# Patient Record
Sex: Female | Born: 1995 | Race: White | Hispanic: No | Marital: Married | Smoking: Former smoker
Health system: Southern US, Community
[De-identification: ages and names within clinical notes are randomized; demographics above are authoritative.]

## PROBLEM LIST (undated history)

## (undated) ENCOUNTER — Inpatient Hospital Stay: Payer: Self-pay

## (undated) DIAGNOSIS — R011 Cardiac murmur, unspecified: Secondary | ICD-10-CM

## (undated) DIAGNOSIS — Z8041 Family history of malignant neoplasm of ovary: Secondary | ICD-10-CM

## (undated) DIAGNOSIS — F319 Bipolar disorder, unspecified: Secondary | ICD-10-CM

## (undated) DIAGNOSIS — J45909 Unspecified asthma, uncomplicated: Secondary | ICD-10-CM

## (undated) DIAGNOSIS — F410 Panic disorder [episodic paroxysmal anxiety] without agoraphobia: Secondary | ICD-10-CM

## (undated) HISTORY — PX: NO PAST SURGERIES: SHX2092

## (undated) HISTORY — DX: Bipolar disorder, unspecified: F31.9

## (undated) HISTORY — DX: Family history of malignant neoplasm of ovary: Z80.41

## (undated) HISTORY — DX: Unspecified asthma, uncomplicated: J45.909

## (undated) HISTORY — DX: Panic disorder (episodic paroxysmal anxiety): F41.0

---
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
## 2014-08-15 NOTE — H&P (Signed)
L&D Evaluation:  History:  HPI Pt is a G1P0 at 37.[redacted] weeks GA who presents to L&D with reports of contractions that started last night but have gotten stronger this am. She reports +FM, denies vb, lof or ctx. Her pregnancy course has been significant for teenage pregnancy, and anxiety depression which has been under controll during this pregnancy. She denies urinary symtoms. She is B+, RI, VI. BGS was performed on 04/11/13 and is not available at this time.   Patient's Medical History other  depression, anxiety   Patient's Surgical History none   Medications Pre Natal Vitamins   Allergies NKDA   Social History previous MJ use before pregnancy   Family History Non-Contributory   ROS:  ROS All systems were reviewed.  HEENT, CNS, GI, GU, Respiratory, CV, Renal and Musculoskeletal systems were found to be normal.   Exam:  Vital Signs stable   General no apparent distress   Mental Status clear   Chest clear   Heart normal sinus rhythm   Abdomen gravid, tender with contractions   Pelvic cervix 1/50/-1.   Mebranes Intact   FHT normal rate with no decels   Ucx regular   Skin dry, no lesions, no rashes   Lymph no lymphadenopathy   Impression:  Impression reactive NST, IUP at 37.3, possible latent labor   Plan:  Plan other, discharge, no cervical change in over 2 hours, discharge home with therapeutic rest and labor precautions   Follow Up Appointment already scheduled   Electronic Signatures: Jannet MantisSubudhi, Aloria Looper (CNM)  (Signed 10-Jan-15 13:13)  Authored: L&D Evaluation   Last Updated: 10-Jan-15 13:13 by Jannet MantisSubudhi, Aadvika Konen (CNM)

## 2014-08-15 NOTE — H&P (Signed)
L&D Evaluation:  History Expanded:  HPI Pt is a 19 yo G1P0, with EDD of 05/03/12 at 37.[redacted] weeks GA who presents to L&D with reports of contractions since 1/9 pm. Pt was seen on 1/10 and found to be closed. D/c home after Morphine injection - reports resting x 1 hr only after this, then pain returned. States she hasn't slept well for the past several nights. She reports +FM, denies vb, lof. Her pregnancy course has been significant for teenage pregnancy, and anxiety & depression which has been under control during this pregnancy.   Blood Type (Maternal) B positive   Group B Strep Results Maternal (Result >5wks must be treated as unknown) negative   Maternal HIV Negative   Maternal Syphilis Ab Unknown   Maternal Varicella Immune   Rubella Results (Maternal) immune   Patient's Medical History other  depression, anxiety   Patient's Surgical History none   Medications Pre Natal Vitamins   Allergies NKDA   Social History previous MJ use before pregnancy   Family History Non-Contributory   ROS:  ROS All systems were reviewed.  HEENT, CNS, GI, GU, Respiratory, CV, Renal and Musculoskeletal systems were found to be normal.   Exam:  Vital Signs stable   General no apparent distress   Mental Status clear   Chest no increased work of breathing   Abdomen gravid, tender with contractions   Estimated Fetal Weight Average for gestational age   Pelvic 1/80/-1, posterior to left lateral   Mebranes Intact   FHT normal rate with no decels, category 1 tracing   Ucx irregular   Skin dry, no lesions   Other UA: 2+ Ketones, 2+ leuk   Impression:  Impression reactive NST, IUP at 37 6/7 wks, prodromal labor   Plan:   Comments IV fluids Will Rx Ambien to hep with insomnia   Follow Up Appointment already scheduled   Electronic Signatures: Vella KohlerBrothers, Logun Colavito K (CNM)  (Signed 12-Jan-15 17:58)  Authored: L&D Evaluation   Last Updated: 12-Jan-15 17:58 by Vella KohlerBrothers, Tania Perrott K  (CNM)

## 2014-08-15 NOTE — H&P (Signed)
L&D Evaluation:  History:  HPI 19 [facility] G1 [facility]d [facility]   Presents with leaking fluid   Patient's Medical History depression, anxiety   Patient's Surgical History none   Medications Pre Natal Vitamins  albuterol, fluticasone   Allergies NKDA   Social History none   Family History Non-Contributory   ROS:  ROS All systems were reviewed.  HEENT, CNS, GI, GU, Respiratory, CV, Renal and Musculoskeletal systems were found to be normal.   Exam:  Vital Signs stable   Pelvic 3/C/0   Mebranes Intact, negative ferning, nitrazine, and pooling   FHT normal rate with no decels, 140, moderate, positive accels, no decels - reactive NST/category I tracing   Ucx regular, q285min   Plan:  Comments 1) R/O PROM/SROM - patient negative pooling and ferning but positive nitrazine in clinic  2) R/O labor - 4/C/0 in clinic today     - 3/C/0 still fairly posterior, intact bag palpated.  Will offere prolonged rule out  3) Fetus - category I tracing   4) PNL: B positive / ABSC neg / RI / VZI / HIV neg / HBsAg neg / RPR NR / 1st trimester screen negative / 1-hr 144 (3-hr 76, 157, 105, 76) GC & CT neg & neg / GBS negative      - 55lbs weight gain over course of pregnancy  5) Immunization - TDAP received 04/26/13, needs influenza prior to discahrge if not received to date  6) Bottle feeding / Nexplanon   Electronic Signatures: Lorrene ReidStaebler, Joice Nazario M (MD)  (Signed 30-Jan-15 15:15)  Authored: L&D Evaluation   Last Updated: 30-Jan-15 15:15 by Lorrene ReidStaebler, Azaryah Oleksy M (MD)

## 2014-10-21 ENCOUNTER — Emergency Department
Admission: EM | Admit: 2014-10-21 | Discharge: 2014-10-21 | Disposition: A | Discharge status: Home / Self Care | Payer: Medicaid Other | Attending: Student | Admitting: Student

## 2014-10-21 ENCOUNTER — Encounter: Payer: Self-pay | Admitting: Emergency Medicine

## 2014-10-21 DIAGNOSIS — Z720 Tobacco use: Secondary | ICD-10-CM | POA: Diagnosis not present

## 2014-10-21 DIAGNOSIS — L0201 Cutaneous abscess of face: Secondary | ICD-10-CM

## 2014-10-21 DIAGNOSIS — R220 Localized swelling, mass and lump, head: Secondary | ICD-10-CM | POA: Diagnosis present

## 2014-10-21 MED ORDER — IBUPROFEN 600 MG PO TABS: 600.0000 mg | ORAL_TABLET | Freq: Three times a day (TID) | ORAL | Status: DC | PRN

## 2014-10-21 MED ORDER — SULFAMETHOXAZOLE-TRIMETHOPRIM 400-80 MG PO TABS: 1.0000 | ORAL_TABLET | Freq: Two times a day (BID) | ORAL | Status: DC

## 2014-10-21 MED ORDER — TRAMADOL HCL 50 MG PO TABS: 50.0000 mg | ORAL_TABLET | Freq: Three times a day (TID) | ORAL | Status: DC | PRN

## 2014-10-21 NOTE — ED Provider Notes (Signed)
[facility] Emergency Department Provider Note  ____________________________________________  Time seen: Approximately 10:16 AM  I have reviewed the triage vital signs and the nursing notes.   HISTORY  Chief Complaint Abscess   HPI Allison Black is a 19 y.o. female presents the ER for complaints of left facial swelling 3-4 days. Patient reports that she had an area that looked like a small pimple that has gradually increase in redness as well as swelling. Patient states that that area is painful and she is unable to sleep on that side of her face. Patient states the pain is currently 6 out of 10 to that area only. Denies pain radiation. Denies fevers. Denies neck pain. Denies eye discomfort or vision changes.  Reports continues to eat and drink well. Denies history of similar in past. Reports her boyfriend and son frequently gets "boils."    History reviewed. No pertinent past medical history.  There are no active problems to display for this patient.   History reviewed. No pertinent past surgical history.  No current outpatient prescriptions on file.  Allergies Review of patient's allergies indicates no known allergies.  No family history on file.  Social History History  Substance Use Topics  . Smoking status: Current Some Day Smoker  . Smokeless tobacco: Not on file  . Alcohol Use: No    Review of Systems Constitutional: No fever/chills Eyes: No visual changes. ENT: No sore throat. Cardiovascular: Denies chest pain. Respiratory: Denies shortness of breath. Gastrointestinal: No abdominal pain.  No nausea, no vomiting.  No diarrhea.  No constipation. Genitourinary: Negative for dysuria. Musculoskeletal: Negative for back pain. Skin: Negative for rash. Left facial redness and swelling.  Neurological: Negative for headaches, focal weakness or numbness.  10-point ROS otherwise  negative.  ____________________________________________   PHYSICAL EXAM:  VITAL SIGNS: ED Triage Vitals  Enc Vitals Group     BP 10/21/14 1007 117/79 mmHg     Pulse Rate 10/21/14 1007 90     Resp 10/21/14 1007 18     Temp 10/21/14 1007 98.1 F (36.7 C)     Temp Source 10/21/14 1007 Oral     SpO2 10/21/14 1007 100 %     Weight 10/21/14 1007 128 lb (58.06 kg)     Height 10/21/14 1007  (1.6 m)     Head Cir --      Peak Flow --      Pain Score 10/21/14 1008 8     Pain Loc --      Pain Edu? --      Excl. in GC? --     Constitutional: Alert and oriented. Well appearing and in no acute distress. Eyes: Conjunctivae are normal. PERRL. EOMI. Head: Atraumatic. See skin below. Ears: no erythema, normal TMs.  Nose: No congestion/rhinnorhea. Mouth/Throat: Mucous membranes are moist.  Oropharynx non-erythematous. Neck: No stridor.  No cervical spine tenderness to palpation. Hematological/Lymphatic/Immunilogical: No cervical lymphadenopathy. Cardiovascular: Normal rate, regular rhythm. Grossly normal heart sounds.  Good peripheral circulation. Respiratory: Normal respiratory effort.  No retractions. Lungs CTAB. Gastrointestinal: Soft and nontender. No distention. . Musculoskeletal: No lower extremity tenderness nor edema.  No joint effusions. Neurologic:  Normal speech and language. No gross focal neurologic deficits are appreciated. No gait instability. Skin:  Skin is warm, dry and intact. No rash noted. Except: left lateral face upper maxilla small 1.5cm area of mod erythema and induration, no fluctuance, no pointing abscess. Minimal surrounding erythema. Face otherwise nontender. No drainage. No indication for incision  and drainage.  Psychiatric: Mood and affect are normal. Speech and behavior are normal.  ____________________________________________   LABS (all labs ordered are listed, but only abnormal results are displayed)  Labs Reviewed - No data to  display ___________________________________________ ____________________________________________   INITIAL IMPRESSION / ASSESSMENT AND PLAN / ED COURSE  Pertinent labs & imaging results that were available during my care of the patient were reviewed by me and considered in my medical decision making (see chart for details).  Very well-appearing patient. No acute distress. Presents the ER for complaints of left lateral facial swelling and redness gradually appearing over several days. Patient reports there was a small pimple there initially but increased in size. Denies drainage. Left lateral face with small area of induration and erythema. No fluctuance or pointing abscess. No indication for I&D. Will treat patient with oral Bactrim, ibuprofen and when necessary tramadol. Discussed keeping clean and apply warm compresses. Discussed follow-up and return parameters. Patient verbalized understanding and agreed to plan. ____________________________________________   FINAL CLINICAL IMPRESSION(S) / ED DIAGNOSES  Final diagnoses:  Abscess of left face      Renford DillsLindsey Krishiv Sandler, NP 10/21/14 1039  Gayla DossEryka A Gayle, MD 10/21/14 1556

## 2014-10-21 NOTE — Discharge Instructions (Signed)
Take medication as prescribed. Keep area clean. Apply warm compresses multiple times a day to help promote drainage.  Follow-up with your primary care physician this week as needed. Return to the ER for new or worsening concerns.  Abscess An abscess is an infected area that contains a collection of pus and debris.It can occur in almost any part of the body. An abscess is also known as a furuncle or boil. CAUSES  An abscess occurs when tissue gets infected. This can occur from blockage of oil or sweat glands, infection of hair follicles, or a minor injury to the skin. As the body tries to fight the infection, pus collects in the area and creates pressure under the skin. This pressure causes pain. People with weakened immune systems have difficulty fighting infections and get certain abscesses more often.  SYMPTOMS Usually an abscess develops on the skin and becomes a painful mass that is red, warm, and tender. If the abscess forms under the skin, you may feel a moveable soft area under the skin. Some abscesses break open (rupture) on their own, but most will continue to get worse without care. The infection can spread deeper into the body and eventually into the bloodstream, causing you to feel ill.  DIAGNOSIS  Your caregiver will take your medical history and perform a physical exam. A sample of fluid may also be taken from the abscess to determine what is causing your infection. TREATMENT  Your caregiver may prescribe antibiotic medicines to fight the infection. However, taking antibiotics alone usually does not cure an abscess. Your caregiver may need to make a small cut (incision) in the abscess to drain the pus. In some cases, gauze is packed into the abscess to reduce pain and to continue draining the area. HOME CARE INSTRUCTIONS   Only take over-the-counter or prescription medicines for pain, discomfort, or fever as directed by your caregiver.  If you were prescribed antibiotics, take them  as directed. Finish them even if you start to feel better.  If gauze is used, follow your caregiver's directions for changing the gauze.  To avoid spreading the infection:  Keep your draining abscess covered with a bandage.  Wash your hands well.  Do not share personal care items, towels, or whirlpools with others.  Avoid skin contact with others.  Keep your skin and clothes clean around the abscess.  Keep all follow-up appointments as directed by your caregiver. SEEK MEDICAL CARE IF:   You have increased pain, swelling, redness, fluid drainage, or bleeding.  You have muscle aches, chills, or a general ill feeling.  You have a fever. MAKE SURE YOU:   Understand these instructions.  Will watch your condition.  Will get help right away if you are not doing well or get worse. Document Released: 01/01/2005 Document Revised: 09/23/2011 Document Reviewed: 06/06/2011 [facility] Patient Information 2015 [place] [place]. This information is not intended to replace advice given to you by your health care provider. Make sure you discuss any questions you have with your health care provider.

## 2014-10-21 NOTE — ED Notes (Signed)
Raised red area L side of face x 1 week

## 2014-10-24 ENCOUNTER — Emergency Department
Admission: EM | Admit: 2014-10-24 | Discharge: 2014-10-24 | Disposition: A | Discharge status: Home / Self Care | Payer: Medicaid Other | Attending: Emergency Medicine | Admitting: Emergency Medicine

## 2014-10-24 ENCOUNTER — Encounter: Payer: Self-pay | Admitting: Medical Oncology

## 2014-10-24 DIAGNOSIS — Z720 Tobacco use: Secondary | ICD-10-CM | POA: Insufficient documentation

## 2014-10-24 DIAGNOSIS — L0201 Cutaneous abscess of face: Secondary | ICD-10-CM | POA: Insufficient documentation

## 2014-10-24 DIAGNOSIS — R220 Localized swelling, mass and lump, head: Secondary | ICD-10-CM | POA: Diagnosis present

## 2014-10-24 MED ORDER — TRAMADOL HCL 50 MG PO TABS: 50.0000 mg | ORAL_TABLET | Freq: Four times a day (QID) | ORAL | Status: AC | PRN

## 2014-10-24 MED ORDER — DOXYCYCLINE HYCLATE 100 MG PO CAPS: 100.0000 mg | ORAL_CAPSULE | Freq: Two times a day (BID) | ORAL | Status: AC

## 2014-10-24 MED ORDER — LIDOCAINE HCL (PF) 1 % IJ SOLN
5.0000 mL | Freq: Once | INTRAMUSCULAR | Status: DC
  Filled 2014-10-24: qty 5

## 2014-10-24 NOTE — ED Notes (Signed)
Pt was seen here Saturday for abscess to side of left face, pt reports she has been taking abx as prescribe pt has continued to have swelling to area.

## 2014-10-24 NOTE — Discharge Instructions (Signed)

## 2014-10-24 NOTE — ED Provider Notes (Signed)
CSN: [id number]     Arrival date & time 10/24/14  0914 History   First MD Initiated Contact with Patient 10/24/14 [phone]     Chief Complaint  Patient presents with  . Facial Swelling    HPI Comments: 19 year old female presents today complaining of left-sided facial swelling for the past week. She was seen in this department on 10/21/2014 by Helene Kelp, nurse practitioner. At that time she was prescribed Bactrim DS 1 tab by mouth twice a day for 10 days. She was also given Motrin and tramadol for pain control. Since her visit to the area on her left temple has gotten more swollen and now she has some periorbital edema. She does not have any eye pain or pain with extraocular movements. No fevers, nausea or vomiting. Her fianc and son both have a history of MRSA.  Patient is a 19 y.o. female presenting with abscess. The history is provided by the patient.  Abscess Location:  Face Facial abscess location:  L cheek Abscess quality: draining, fluctuance, painful, redness and warmth   Red streaking: no   Duration:  7 days Progression:  Worsening Pain details:    Quality:  Sharp   Severity:  Moderate   Timing:  Constant   Progression:  Unchanged Chronicity:  New Relieved by:  None tried Worsened by:  Draining/squeezing Ineffective treatments:  Draining/squeezing, tar ointment, NSAIDs and oral antibiotics Associated symptoms: no fever, no headaches, no nausea and no vomiting   Risk factors: no family hx of MRSA     History reviewed. No pertinent past medical history. History reviewed. No pertinent past surgical history. No family history on file. History  Substance Use Topics  . Smoking status: Current Some Day Smoker  . Smokeless tobacco: Not on file  . Alcohol Use: No   OB History    No data available     Review of Systems  Constitutional: Negative for fever and chills.  Gastrointestinal: Negative for nausea and vomiting.  Musculoskeletal: Negative for myalgias and  arthralgias.  Skin: Positive for wound.  Neurological: Negative for headaches.  All other systems reviewed and are negative.     Allergies  Review of patient's allergies indicates no known allergies.  Home Medications   Prior to Admission medications   Medication Sig Start Date End Date Taking? Authorizing Provider  doxycycline (VIBRAMYCIN) 100 MG capsule Take 1 capsule (100 mg total) by mouth 2 (two) times daily. 10/24/14 11/03/14  Luvenia Redden, PA-C  ibuprofen (ADVIL,MOTRIN) 600 MG tablet Take 1 tablet (600 mg total) by mouth every 8 (eight) hours as needed for mild pain or moderate pain. 10/21/14   Renford Dills, NP  sulfamethoxazole-trimethoprim (BACTRIM) 400-80 MG per tablet Take 1 tablet by mouth 2 (two) times daily. 10/21/14   Renford Dills, NP  traMADol (ULTRAM) 50 MG tablet Take 1 tablet (50 mg total) by mouth every 6 (six) hours as needed. 10/24/14 10/24/15  Wilber Oliphant V, PA-C   BP 100/59 mmHg  Pulse 86  Temp(Src) 97.8 F (36.6 C) (Oral)  Resp 16  Ht  (1.6 m)  Wt 128 lb (58.06 kg)  BMI 22.68 kg/m2  SpO2 99%  LMP 10/07/2014 (Approximate) Physical Exam  Constitutional: She is oriented to person, place, and time. Vital signs are normal. She appears well-developed and well-nourished. She is active.  Non-toxic appearance. She does not have a sickly appearance. She does not appear ill.  HENT:  Head: Normocephalic and atraumatic.  Eyes: Conjunctivae and EOM are normal. Pupils  are equal, round, and reactive to light.  No pain with EOMs Periorbital edema left eye, no warmth or induration  Neck: Normal range of motion. Neck supple.  Lymphadenopathy:    She has no cervical adenopathy.  Neurological: She is alert and oriented to person, place, and time.  Skin: Skin is warm and dry.  3 cm indurated abscess to left temple. It is actively draining purulent and bloody material. Warm and exquisitely tender to the touch  Psychiatric: She has a normal mood and affect. Her  behavior is normal. Judgment and thought content normal.  Nursing note and vitals reviewed.   ED Course  INCISION AND DRAINAGE Date/Time: 10/24/2014 10:01 AM Performed by: Luvenia ReddenWEAVIL, Jaspreet Hollings V Authorized by: Luvenia ReddenWEAVIL, Antinio Sanderfer V Consent: Verbal consent obtained. Risks and benefits: risks, benefits and alternatives were discussed Consent given by: patient Patient understanding: patient states understanding of the procedure being performed Patient consent: the patient's understanding of the procedure matches consent given Procedure consent: procedure consent matches procedure scheduled Relevant documents: relevant documents present and verified Required items: required blood products, implants, devices, and special equipment available Patient identity confirmed: verbally with patient Time out: Immediately prior to procedure a "time out" was called to verify the correct patient, procedure, equipment, support staff and site/side marked as required. Type: abscess Body area: head/neck Location details: face Anesthesia: local infiltration Local anesthetic: lidocaine 1% without epinephrine Anesthetic total: 3 ml Patient sedated: no Scalpel size: 11 Incision type: single straight Complexity: simple Drainage: purulent and  bloody Drainage amount: moderate Wound treatment: wound left open Patient tolerance: Patient tolerated the procedure well with no immediate complications   (including critical care time) Labs Review Labs Reviewed - No data to display  Imaging Review No results found.   EKG Interpretation None      MDM  Incision and drainage performed as above. Advised patient to apply warm compresses multiple times per day. Stop Bactrim start doxycycline 100 mg twice a day for 10 days. Return for increased swelling, pain or fevers Final diagnoses:  Facial abscess       Luvenia Reddenmma Weavil V, PA-C 10/24/14 1010  Sharyn CreamerMark Quale, MD 10/24/14 1537

## 2014-10-25 ENCOUNTER — Telehealth: Payer: Self-pay | Admitting: Emergency Medicine

## 2014-10-25 NOTE — ED Notes (Signed)
Patient called asking to fill only pain med because doxycycline is $54 and she cannot afford that today.  I called medication management and they have doxycycline and will fill for patient.  i called [facility] and told them they can fill the tramadol only and [facility]

## 2015-03-25 ENCOUNTER — Encounter: Payer: Self-pay | Admitting: Emergency Medicine

## 2015-03-25 ENCOUNTER — Emergency Department
Admission: EM | Admit: 2015-03-25 | Discharge: 2015-03-25 | Disposition: A | Discharge status: Home / Self Care | Payer: Medicaid Other | Attending: Emergency Medicine | Admitting: Emergency Medicine

## 2015-03-25 DIAGNOSIS — O99331 Smoking (tobacco) complicating pregnancy, first trimester: Secondary | ICD-10-CM | POA: Insufficient documentation

## 2015-03-25 DIAGNOSIS — O209 Hemorrhage in early pregnancy, unspecified: Secondary | ICD-10-CM | POA: Diagnosis present

## 2015-03-25 DIAGNOSIS — Z792 Long term (current) use of antibiotics: Secondary | ICD-10-CM | POA: Insufficient documentation

## 2015-03-25 DIAGNOSIS — Z3A09 9 weeks gestation of pregnancy: Secondary | ICD-10-CM | POA: Insufficient documentation

## 2015-03-25 DIAGNOSIS — F17200 Nicotine dependence, unspecified, uncomplicated: Secondary | ICD-10-CM | POA: Diagnosis not present

## 2015-03-25 DIAGNOSIS — O039 Complete or unspecified spontaneous abortion without complication: Secondary | ICD-10-CM | POA: Insufficient documentation

## 2015-03-25 LAB — LIPASE, BLOOD: LIPASE: 20 U/L (ref 11–51)

## 2015-03-25 LAB — COMPREHENSIVE METABOLIC PANEL
ALBUMIN: 4.3 g/dL (ref 3.5–5.0)
ALK PHOS: 58 U/L (ref 38–126)
ALT: 13 U/L — ABNORMAL LOW (ref 14–54)
ANION GAP: 5 (ref 5–15)
AST: 17 U/L (ref 15–41)
BUN: 12 mg/dL (ref 6–20)
CO2: 25 mmol/L (ref 22–32)
Calcium: 9.1 mg/dL (ref 8.9–10.3)
Chloride: 107 mmol/L (ref 101–111)
Creatinine, Ser: 0.5 mg/dL (ref 0.44–1.00)
GFR calc Af Amer: >60 mL/min (ref >60)
GFR calc non Af Amer: >60 mL/min (ref >60)
GLUCOSE: 99 mg/dL (ref 65–99)
POTASSIUM: 3.9 mmol/L (ref 3.5–5.1)
SODIUM: 137 mmol/L (ref 135–145)
Total Bilirubin: 0.7 mg/dL (ref 0.3–1.2)
Total Protein: 7.6 g/dL (ref 6.5–8.1)

## 2015-03-25 LAB — CBC WITH DIFFERENTIAL/PLATELET
BASOS ABS: 0 10*3/uL (ref 0–0.1)
BASOS PCT: 0 %
EOS ABS: 0.2 10*3/uL (ref 0–0.7)
Eosinophils Relative: 2 %
HCT: 38.9 % (ref 35.0–47.0)
HEMOGLOBIN: 12.8 g/dL (ref 12.0–16.0)
Lymphocytes Relative: 19 %
Lymphs Abs: 1.8 10*3/uL (ref 1.0–3.6)
MCH: 28.2 pg (ref 26.0–34.0)
MCHC: 32.8 g/dL (ref 32.0–36.0)
MCV: 85.9 fL (ref 80.0–100.0)
MONOS PCT: 6 %
Monocytes Absolute: 0.6 10*3/uL (ref 0.2–0.9)
NEUTROS ABS: 6.9 10*3/uL — AB (ref 1.4–6.5)
NEUTROS PCT: 73 %
Platelets: 241 10*3/uL (ref 150–440)
RBC: 4.53 MIL/uL (ref 3.80–5.20)
RDW: 13.5 % (ref 11.5–14.5)
WBC: 9.5 10*3/uL (ref 3.6–11.0)

## 2015-03-25 LAB — ABO/RH: ABO/RH(D): B POS

## 2015-03-25 LAB — HCG, QUANTITATIVE, PREGNANCY: hCG, Beta Chain, Quant, S: 1417 m[IU]/mL — ABNORMAL HIGH (ref <5)

## 2015-03-25 MED ORDER — MORPHINE SULFATE (PF) 4 MG/ML IV SOLN
4.0000 mg | Freq: Once | INTRAVENOUS | Status: AC
  Administered 2015-03-25: 4 mg via INTRAVENOUS
  Filled 2015-03-25: qty 1

## 2015-03-25 MED ORDER — ONDANSETRON HCL 4 MG/2ML IJ SOLN
4.0000 mg | Freq: Once | INTRAMUSCULAR | Status: AC
  Administered 2015-03-25: 4 mg via INTRAVENOUS
  Filled 2015-03-25: qty 2

## 2015-03-25 MED ORDER — HYDROMORPHONE HCL 1 MG/ML IJ SOLN
1.0000 mg | Freq: Once | INTRAMUSCULAR | Status: AC
  Administered 2015-03-25: 1 mg via INTRAVENOUS
  Filled 2015-03-25: qty 1

## 2015-03-25 MED ORDER — SODIUM CHLORIDE 0.9 % IV BOLUS (SEPSIS)
1000.0000 mL | Freq: Once | INTRAVENOUS | Status: AC
  Administered 2015-03-25: 1000 mL via INTRAVENOUS

## 2015-03-25 MED ORDER — OXYCODONE-ACETAMINOPHEN 5-325 MG PO TABS: 1.0000 | ORAL_TABLET | Freq: Four times a day (QID) | ORAL | Status: DC | PRN

## 2015-03-25 NOTE — ED Provider Notes (Signed)
[facility] Emergency Department Provider Note  Time seen: 12:27 PM  I have reviewed the triage vital signs and the nursing notes.   HISTORY  Chief Complaint Vaginal Bleeding; Abdominal Pain; and Miscarriage    HPI Allison Black is a 19 y.o. female with no past medical history approximately 3 months pregnant presents the emergency department with a likely miscarriage. According to the patient she was having significant abdominal cramping this past week, she went to OB/GYN and had an ultrasound, per patient it was consistent with a miscarriage measuring 9 weeks when he should've been measuring 12 weeks. Patient states this morning she awoke with significant lower abdominal cramping, and significant bleeding. States she has never had this much bleeding with a period before so she came to the emergency department for evaluation. States she is having a lot of pain, taking ibuprofen at home without relief. Describes her pain as moderate lower abdominal cramping.     History reviewed. No pertinent past medical history.  There are no active problems to display for this patient.   History reviewed. No pertinent past surgical history.  Current Outpatient Rx  Name  Route  Sig  Dispense  Refill  . ibuprofen (ADVIL,MOTRIN) 600 MG tablet   Oral   Take 1 tablet (600 mg total) by mouth every 8 (eight) hours as needed for mild pain or moderate pain.   15 tablet   0   . sulfamethoxazole-trimethoprim (BACTRIM) 400-80 MG per tablet   Oral   Take 1 tablet by mouth 2 (two) times daily.   20 tablet   0   . traMADol (ULTRAM) 50 MG tablet   Oral   Take 1 tablet (50 mg total) by mouth every 6 (six) hours as needed.   20 tablet   0     Allergies Review of patient's allergies indicates no known allergies.  History reviewed. No pertinent family history.  Social History Social History  Substance Use Topics  . Smoking status: Current Some Day Smoker  . Smokeless  tobacco: None  . Alcohol Use: No    Review of Systems Constitutional: Negative for fever. Cardiovascular: Negative for chest pain. Respiratory: Negative for shortness of breath. Gastrointestinal: Positive lower abdominal pain/cramping. Negative for nausea, vomiting, diarrhea. Genitourinary: Negative for dysuria. Positive for vaginal bleeding. Musculoskeletal: Mild lower back pain. Neurological: Negative for headache 10-point ROS otherwise negative.  ____________________________________________   PHYSICAL EXAM:  VITAL SIGNS: ED Triage Vitals  Enc Vitals Group     BP 03/25/15 0922 105/78 mmHg     Pulse Rate 03/25/15 0922 80     Resp 03/25/15 0922 18     Temp 03/25/15 0922 98.2 F (36.8 C)     Temp Source 03/25/15 0922 Oral     SpO2 03/25/15 0922 99 %     Weight 03/25/15 0922 128 lb (58.06 kg)     Height 03/25/15 0922 5\' 2"  (1.575 m)     Head Cir --      Peak Flow --      Pain Score 03/25/15 0923 7     Pain Loc --      Pain Edu? --      Excl. in GC? --     Constitutional: Alert and oriented. Well appearing and in no distress. Eyes: Normal exam ENT   Head: Normocephalic and atraumatic.   Mouth/Throat: Mucous membranes are moist. Cardiovascular: Normal rate, regular rhythm. No murmur Respiratory: Normal respiratory effort without tachypnea nor retractions. Breath sounds are  clear  Gastrointestinal: Soft and nontender. No distention.   Musculoskeletal: Nontender with normal range of motion in all extremities.  Neurologic:  Normal speech and language. No gross focal neurologic deficits Skin:  Skin is warm, dry and intact.  Psychiatric: Mood and affect are normal. Speech and behavior are normal.   ____________________________________________   INITIAL IMPRESSION / ASSESSMENT AND PLAN / ED COURSE  Pertinent labs & imaging results that were available during my care of the patient were reviewed by me and considered in my medical decision making (see chart for  details).  Patient says emergency department with lower abdominal pain/discomfort. States she was seen in OB/GYN on Wednesday with an ultrasound consistent with miscarriage. Patient's labs are within normal limits, hemoglobin of 12.8. Currently awaiting beta hCG result. We will discuss the patient with OB/GYN as I cannot visualize the ultrasound from our system. I will perform a pelvic examination, dose pain medication and IV fluids.  Discussed the patient with Dr. Almon Hercules OB[facility] he states he pulled up the patient's ultrasound from 12/14 showing a 6 week 2 day fetus not consistent with dates, no heart tones, consistent with miscarriage. Beta-hCG that time was 2600, today is 1400. Patient strongly wishes to avoid another ultrasound and she states it was quite painful for her. We are treating the patient's pain in the emergency department. I will perform a pelvic examination to rule out any tissue in the os, patient's labs including H&H appear well. We'll discharge home with pain medication and [facility] follow-up.  Pelvic exam shows a significant amount of clot which was removed, mild bleeding after clot was removed. We will discharge the patient home on Percocet, I discussed strict return precautions for lightheadedness, dizziness, syncope or worsening pain. Patient has follow up with [facility] tomorrow. ____________________________________________   FINAL CLINICAL IMPRESSION(S) / ED DIAGNOSES  miscarriage   Minna Antis, MD 03/25/15 1352

## 2015-03-25 NOTE — ED Notes (Signed)
Pt presents to Ed with vaginal bleeding with abdominal cramps that started last night with heavy bleeding. Pt states OBGYN visit on Wed for emergency ultrasound which was a miscarriage. Pt states she is having a lot of pain . "I have never been bleeding this much".

## 2015-03-25 NOTE — Discharge Instructions (Signed)
Miscarriage  A miscarriage is the sudden loss of an unborn baby (fetus) before the 20th week of pregnancy. Most miscarriages happen in the first 3 months of pregnancy. Sometimes, it happens before a woman even knows she is pregnant. A miscarriage is also called a "spontaneous miscarriage" or "early pregnancy loss." Having a miscarriage can be an emotional experience. Talk with your caregiver about any questions you may have about miscarrying, the grieving process, and your future pregnancy plans.  CAUSES    Problems with the fetal chromosomes that make it impossible for the baby to develop normally. Problems with the baby's genes or chromosomes are most often the result of errors that occur, by chance, as the embryo divides and grows. The problems are not inherited from the parents.   Infection of the cervix or uterus.    Hormone problems.    Problems with the cervix, such as having an incompetent cervix. This is when the tissue in the cervix is not strong enough to hold the pregnancy.    Problems with the uterus, such as an abnormally shaped uterus, uterine fibroids, or congenital abnormalities.    Certain medical conditions.    Smoking, drinking alcohol, or taking illegal drugs.    Trauma.   Often, the cause of a miscarriage is unknown.   SYMPTOMS    Vaginal bleeding or spotting, with or without cramps or pain.   Pain or cramping in the abdomen or lower back.   Passing fluid, tissue, or blood clots from the vagina.  DIAGNOSIS   Your caregiver will perform a physical exam. You may also have an ultrasound to confirm the miscarriage. Blood or urine tests may also be ordered.  TREATMENT    Sometimes, treatment is not necessary if you naturally pass all the fetal tissue that was in the uterus. If some of the fetus or placenta remains in the body (incomplete miscarriage), tissue left behind may become infected and must be removed. Usually, a dilation and curettage (D and C) procedure is performed.  During a D and C procedure, the cervix is widened (dilated) and any remaining fetal or placental tissue is gently removed from the uterus.   Antibiotic medicines are prescribed if there is an infection. Other medicines may be given to reduce the size of the uterus (contract) if there is a lot of bleeding.   If you have Rh negative blood and your baby was Rh positive, you will need a Rh immunoglobulin shot. This shot will protect any future baby from having Rh blood problems in future pregnancies.  HOME CARE INSTRUCTIONS    Your caregiver may order bed rest or may allow you to continue light activity. Resume activity as directed by your caregiver.   Have someone help with home and family responsibilities during this time.    Keep track of the number of sanitary pads you use each day and how soaked (saturated) they are. Write down this information.    Do not use tampons. Do not douche or have sexual intercourse until approved by your caregiver.    Only take over-the-counter or prescription medicines for pain or discomfort as directed by your caregiver.    Do not take aspirin. Aspirin can cause bleeding.    Keep all follow-up appointments with your caregiver.    If you or your partner have problems with grieving, talk to your caregiver or seek counseling to help cope with the pregnancy loss. Allow enough time to grieve before trying to get pregnant again.     SEEK IMMEDIATE MEDICAL CARE IF:    You have severe cramps or pain in your back or abdomen.   You have a fever.   You pass large blood clots (walnut-sized or larger) ortissue from your vagina. Save any tissue for your caregiver to inspect.    Your bleeding increases.    You have a thick, bad-smelling vaginal discharge.   You become lightheaded, weak, or you faint.    You have chills.   MAKE SURE YOU:   Understand these instructions.   Will watch your condition.   Will get help right away if you are not doing well or get worse.     This  information is not intended to replace advice given to you by your health care provider. Make sure you discuss any questions you have with your health care provider.     Document Released: 09/17/2000 Document Revised: 07/19/2012 Document Reviewed: 05/13/2011  Elsevier Interactive Patient Education 2016 [employer]

## 2015-03-25 NOTE — ED Notes (Signed)
Dr and this nurse in with pt for vaginal exam. Large amt of clots cleaned out of the vaginal vault.

## 2016-02-22 ENCOUNTER — Encounter: Payer: Self-pay | Admitting: Emergency Medicine

## 2016-02-22 ENCOUNTER — Emergency Department
Admission: EM | Admit: 2016-02-22 | Discharge: 2016-02-22 | Disposition: A | Discharge status: Home / Self Care | Payer: Medicaid Other | Attending: Emergency Medicine | Admitting: Emergency Medicine

## 2016-02-22 DIAGNOSIS — F17200 Nicotine dependence, unspecified, uncomplicated: Secondary | ICD-10-CM | POA: Insufficient documentation

## 2016-02-22 DIAGNOSIS — R51 Headache: Secondary | ICD-10-CM | POA: Diagnosis present

## 2016-02-22 DIAGNOSIS — Z79899 Other long term (current) drug therapy: Secondary | ICD-10-CM | POA: Diagnosis not present

## 2016-02-22 DIAGNOSIS — L0201 Cutaneous abscess of face: Secondary | ICD-10-CM | POA: Diagnosis not present

## 2016-02-22 DIAGNOSIS — L03211 Cellulitis of face: Secondary | ICD-10-CM | POA: Insufficient documentation

## 2016-02-22 MED ORDER — SULFAMETHOXAZOLE-TRIMETHOPRIM 800-160 MG PO TABS
1.0000 | ORAL_TABLET | Freq: Once | ORAL | Status: AC
  Administered 2016-02-22: 1 via ORAL
  Filled 2016-02-22: qty 1

## 2016-02-22 MED ORDER — SULFAMETHOXAZOLE-TRIMETHOPRIM 800-160 MG PO TABS: 1.0000 | ORAL_TABLET | Freq: Two times a day (BID) | ORAL | 0 refills | Status: AC

## 2016-02-22 NOTE — ED Notes (Signed)
Pt here with son who is being seen; reports having "boil" to right cheek x 2wks with hx of same; pt denies any fever or accomp symptoms

## 2016-02-22 NOTE — ED Triage Notes (Signed)
Pt reports "boil" to right cheek x 2weeks with hx of same

## 2016-02-22 NOTE — ED Provider Notes (Signed)
[facility] Emergency Department Provider Note  ____________________________________________  Time seen: 4:50AM   I have reviewed the triage vital signs and the nursing notes.   HISTORY  Chief Complaint Abscess     HPI Allison Black is a 20 y.o. female presents with concern for possible right facial abscess 2 weeks. Patient states that she has noted increased pain swelling and redness in the area. Patient admits to previous abscess of a left facial abscess in the past. Patient denies any fever current pain score 6 out of 10.  Past medical history Left facial abscess There are no active problems to display for this patient.   Past surgical history None  Current Outpatient Rx  . Order #: [id number] Class: Print  . Order #: [id number] Class: Print  . Order #: [id number] Class: Print    Allergies No known drug allergies No family history on file.  Social History Social History  Substance Use Topics  . Smoking status: Current Some Day Smoker  . Smokeless tobacco: Never Used  . Alcohol use No    Review of Systems  Constitutional: Negative for fever. Eyes: Negative for visual changes. ENT: Negative for sore throat. Cardiovascular: Negative for chest pain. Respiratory: Negative for shortness of breath. Gastrointestinal: Negative for abdominal pain, vomiting and diarrhea. Genitourinary: Negative for dysuria. Musculoskeletal: Negative for back pain. Skin: Negative for rash.Positive for right facial abscess Neurological: Negative for headaches, focal weakness or numbness.   10-point ROS otherwise negative.  ____________________________________________   PHYSICAL EXAM:  VITAL SIGNS: ED Triage Vitals  Enc Vitals Group     BP 02/22/16 0450 (!) 102/58     Pulse Rate 02/22/16 0450 82     Resp 02/22/16 0450 18     Temp 02/22/16 0450 98.1 F (36.7 C)     Temp Source 02/22/16 0450 Oral     SpO2 02/22/16 0450 100 %     Weight 02/22/16 0450  117 lb (53.1 kg)     Height 02/22/16 0450 5\' 2"  (1.575 m)     Head Circumference --      Peak Flow --      Pain Score 02/22/16 0451 6     Pain Loc --      Pain Edu? --      Excl. in GC? --      Constitutional: Alert and oriented. Well appearing and in no distress. Eyes: Conjunctivae are normal. PERRL. Normal extraocular movements. ENT   Head: 6 x 6 cm area of induration and erythema noted on the right cheek   Nose: No congestion/rhinnorhea.   Mouth/Throat: Mucous membranes are moist.   Neck: No stridor. Hematological/Lymphatic/Immunilogical: No cervical lymphadenopathy. Cardiovascular: Normal rate, regular rhythm. Normal and symmetric distal pulses are present in all extremities. No murmurs, rubs, or gallops. Respiratory: Normal respiratory effort without tachypnea nor retractions. Breath sounds are clear and equal bilaterally. No wheezes/rales/rhonchi. Gastrointestinal: Soft and nontender. No distention. There is no CVA tenderness. Genitourinary: deferred Musculoskeletal: Nontender with normal range of motion in all extremities. No joint effusions.  No lower extremity tenderness nor edema. Neurologic:  Normal speech and language. No gross focal neurologic deficits are appreciated. Speech is normal.  Skin: 6 x 6 cm area of induration and erythema noted on her right cheek Psychiatric: Mood and affect are normal. Speech and behavior are normal. Patient exhibits appropriate insight and judgment.      Procedures     INITIAL IMPRESSION / ASSESSMENT AND PLAN / ED COURSE  Pertinent labs & imaging results  that were available during my care of the patient were reviewed by me and considered in my medical decision making (see chart for details).  Given the location of the patient's facial abscess I&D not performed as such patient will be given oral antibiotics and advised to follow-up in 2 days or primary care  provider  ____________________________________________   FINAL CLINICAL IMPRESSION(S) / ED DIAGNOSES  Final diagnoses:  Cellulitis of face      Darci Currentandolph N Yoshie Kosel, MD 02/22/16 [phone]

## 2016-03-29 ENCOUNTER — Encounter: Payer: Self-pay | Admitting: Emergency Medicine

## 2016-03-29 ENCOUNTER — Emergency Department
Admission: EM | Admit: 2016-03-29 | Discharge: 2016-03-29 | Disposition: A | Discharge status: Home / Self Care | Payer: Medicaid Other | Attending: Emergency Medicine | Admitting: Emergency Medicine

## 2016-03-29 ENCOUNTER — Emergency Department: Payer: Medicaid Other

## 2016-03-29 DIAGNOSIS — Z791 Long term (current) use of non-steroidal anti-inflammatories (NSAID): Secondary | ICD-10-CM | POA: Insufficient documentation

## 2016-03-29 DIAGNOSIS — F17200 Nicotine dependence, unspecified, uncomplicated: Secondary | ICD-10-CM | POA: Insufficient documentation

## 2016-03-29 DIAGNOSIS — M940 Chondrocostal junction syndrome [Tietze]: Secondary | ICD-10-CM | POA: Insufficient documentation

## 2016-03-29 DIAGNOSIS — R0789 Other chest pain: Secondary | ICD-10-CM | POA: Diagnosis present

## 2016-03-29 DIAGNOSIS — R079 Chest pain, unspecified: Secondary | ICD-10-CM

## 2016-03-29 HISTORY — DX: Cardiac murmur, unspecified: R01.1

## 2016-03-29 LAB — CBC
HCT: 41.7 % (ref 35.0–47.0)
Hemoglobin: 14.1 g/dL (ref 12.0–16.0)
MCH: 29.2 pg (ref 26.0–34.0)
MCHC: 33.9 g/dL (ref 32.0–36.0)
MCV: 86.3 fL (ref 80.0–100.0)
PLATELETS: 260 10*3/uL (ref 150–440)
RBC: 4.83 MIL/uL (ref 3.80–5.20)
RDW: 13.2 % (ref 11.5–14.5)
WBC: 8 10*3/uL (ref 3.6–11.0)

## 2016-03-29 LAB — BASIC METABOLIC PANEL
ANION GAP: 8 (ref 5–15)
BUN: 16 mg/dL (ref 6–20)
CALCIUM: 9.4 mg/dL (ref 8.9–10.3)
CO2: 24 mmol/L (ref 22–32)
CREATININE: 0.69 mg/dL (ref 0.44–1.00)
Chloride: 105 mmol/L (ref 101–111)
GLUCOSE: 96 mg/dL (ref 65–99)
Potassium: 3.8 mmol/L (ref 3.5–5.1)
Sodium: 137 mmol/L (ref 135–145)

## 2016-03-29 LAB — TROPONIN I

## 2016-03-29 LAB — POCT PREGNANCY, URINE: PREG TEST UR: NEGATIVE

## 2016-03-29 MED ORDER — HYDROCODONE-ACETAMINOPHEN 5-325 MG PO TABS: 1.0000 | ORAL_TABLET | Freq: Once | ORAL | Status: DC

## 2016-03-29 MED ORDER — PREDNISONE 10 MG PO TABS: 10.0000 mg | ORAL_TABLET | Freq: Every day | ORAL | 0 refills | Status: DC

## 2016-03-29 NOTE — ED Provider Notes (Signed)
[facility] Emergency Department Provider Note  ____________________________________________  Time seen: Approximately 10:56 PM  I have reviewed the triage vital signs and the nursing notes.   HISTORY  Chief Complaint Chest Pain    HPI Allison ChamberHeather Black is a 20 y.o. female who presents to emergency department complaining of anterior chest wall pain. Patient states that she has had pleuritic chest pain 7 days. She also reports that she's had an intermittent dry cough. Patient states that initially she thought she was getting a chest cold but states that she never developed any nasal congestion, sore throat, fevers or chills, productive cough. Patient reports that it is a burning/sharp sensation on bilateral aspects of the sternum. She denies any trauma to the area. Patient states that the sensation is there at all times but worse with concentrated thought about issue.  Patient also has a significant history of underlying anxiety. Patient states that she has had panic attacks and while there is some tightness that is associated with panic attacks, she does not have associated fear or shortness of breath.   Past Medical History:  Diagnosis Date  . Heart murmur     There are no active problems to display for this patient.   History reviewed. No pertinent surgical history.  Prior to Admission medications   Medication Sig Start Date End Date Taking? Authorizing Provider  ibuprofen (ADVIL,MOTRIN) 600 MG tablet Take 1 tablet (600 mg total) by mouth every 8 (eight) hours as needed for mild pain or moderate pain. 10/21/14   Renford DillsLindsey Miller, NP  oxyCODONE-acetaminophen (ROXICET) 5-325 MG tablet Take 1-2 tablets by mouth every 6 (six) hours as needed. 03/25/15   Minna AntisKevin Paduchowski, MD  predniSONE (DELTASONE) 10 MG tablet Take 1 tablet (10 mg total) by mouth daily. 03/29/16   Delorise RoyalsJonathan D Cuthriell, PA-C  sulfamethoxazole-trimethoprim (BACTRIM) 400-80 MG per tablet Take 1  tablet by mouth 2 (two) times daily. 10/21/14   Renford DillsLindsey Miller, NP    Allergies Patient has no known allergies.  No family history on file.  Social History Social History  Substance Use Topics  . Smoking status: Current Some Day Smoker    Packs/day: 0.12  . Smokeless tobacco: Never Used  . Alcohol use No     Review of Systems  Constitutional: No fever/chills Eyes: No visual changes. No discharge ENT: No upper respiratory complaints. Cardiovascular: Anterior pleuritic chest pain Respiratory: Intermittent nonproductive cough. No SOB. Gastrointestinal: No abdominal pain.  No nausea, no vomiting.  Musculoskeletal: Anterior chest wall pain Skin: Negative for rash, abrasions, lacerations, ecchymosis. Neurological: Negative for headaches, focal weakness or numbness. 10-point ROS otherwise negative.  ____________________________________________   PHYSICAL EXAM:  VITAL SIGNS: ED Triage Vitals [03/29/16 2052]  Enc Vitals Group     BP 119/82     Pulse Rate 80     Resp 18     Temp 98.2 F (36.8 C)     Temp Source Oral     SpO2 100 %     Weight 120 lb (54.4 kg)     Height 5\' 2"  (1.575 m)     Head Circumference      Peak Flow      Pain Score 6     Pain Loc      Pain Edu?      Excl. in GC?      Constitutional: Alert and oriented. Well appearing and in no acute distress. Eyes: Conjunctivae are normal. PERRL. EOMI. Head: Atraumatic. ENT:  Ears:       Nose: No congestion/rhinnorhea.      Mouth/Throat: Mucous membranes are moist.  Neck: No stridor.   Hematological/Lymphatic/Immunilogical: No cervical lymphadenopathy. Cardiovascular: Normal rate, regular rhythm. Normal S1 and S2.No murmurs, rubs, gallops.  Good peripheral circulation. Respiratory: Normal respiratory effort without tachypnea or retractions. Lungs CTAB. Good air entry to the bases with no decreased or absent breath sounds. Musculoskeletal: Full range of motion to all extremities. No gross deformities  appreciated. No deformities noted ribs on inspection. No paradoxical chest wall movement. No flow segments. Patient is tender to palpation in the intercostal margin of bilateral aspects of the sternum. Neurologic:  Normal speech and language. No gross focal neurologic deficits are appreciated.  Skin:  Skin is warm, dry and intact. No rash noted. Psychiatric: Mood and affect are normal. Speech and behavior are normal. Patient exhibits appropriate insight and judgement.   ____________________________________________   LABS (all labs ordered are listed, but only abnormal results are displayed)  Labs Reviewed  BASIC METABOLIC PANEL  CBC  TROPONIN I  POCT PREGNANCY, URINE   ____________________________________________  EKG  EKG reveals normal sinus rhythm at a rate of 85 bpm. Sinus arrhythmia is identified. No ST elevation or depression noted. PR, QRS, QT intervals within normal limits. No Q waves or delta waves identified. ____________________________________________  RADIOLOGY Festus Barren Cuthriell, personally viewed and evaluated these images (plain radiographs) as part of my medical decision making, as well as reviewing the written report by the radiologist.  Dg Chest 2 View  Result Date: 03/29/2016 CLINICAL DATA:  Pleuritic chest pain for 6 days. EXAM: CHEST  2 VIEW COMPARISON:  None. FINDINGS: The heart size and mediastinal contours are within normal limits. Both lungs are clear. The visualized skeletal structures are unremarkable. IMPRESSION: No active cardiopulmonary disease. Electronically Signed   By: Ellery Plunk M.D.   On: 03/29/2016 21:28    ____________________________________________    PROCEDURES  Procedure(s) performed:    Procedures    Medications - No data to display   ____________________________________________   INITIAL IMPRESSION / ASSESSMENT AND PLAN / ED COURSE  Pertinent labs & imaging results that were available during my care of the  patient were reviewed by me and considered in my medical decision making (see chart for details).  Review of the [place] CSRS was performed in accordance of the [facility] prior to dispensing any controlled drugs.  Clinical Course     Patient's diagnosis is consistent with Nonspecific chest pain likely costochondritis. Patient has nonspecific complaints of anterior chest wall pain. This is pleuritic in nature. Patient's labs, imaging, history is reassuring. Patient's exam does reveal reproducible pain with palpation to the bilateral aspects of the sternum. This is likely costochondritis. I feel that this condition is exacerbated by patient's underlying history of anxiety. Patient will be discharged home with prescriptions for steroids. Patient is to follow up with primary care as needed or otherwise directed. Patient is given ED precautions to return to the ED for any worsening or new symptoms.     ____________________________________________  FINAL CLINICAL IMPRESSION(S) / ED DIAGNOSES  Final diagnoses:  Nonspecific chest pain  Costochondritis      NEW MEDICATIONS STARTED DURING THIS VISIT:  New Prescriptions   PREDNISONE (DELTASONE) 10 MG TABLET    Take 1 tablet (10 mg total) by mouth daily.        This chart was dictated using voice recognition software/Dragon. Despite best efforts to proofread, errors can occur which can change  the meaning. Any change was purely unintentional.    Racheal PatchesJonathan D Cuthriell, PA-C 03/29/16 [id number]    Phineas SemenGraydon Goodman, MD 03/29/16 2340

## 2016-03-29 NOTE — ED Notes (Signed)
Pt reports 1 week of chest pain with cough only, worse at night; sore throat when she coughs; unable to speak of fever-taking Ibuprofen for pain; talking in complete coherent sentences

## 2016-03-29 NOTE — ED Triage Notes (Signed)
Pt is ambulatory to triage with c/o chest pain upon deep breathing. Pt states that this started last Sunday and she has been using a humidifier but denies cold symptoms. Pt is in NAD at this time.

## 2016-03-29 NOTE — ED Notes (Signed)
POCT urine preg = negative

## 2016-04-07 NOTE — L&D Delivery Note (Signed)
Delivery Note Primary OB: [facility] Delivery Physician: Annamarie MajorPaul Keiston Manley, MD Gestational Age: Full term Antepartum complications: none Intrapartum complications: None  A viable Female was delivered via vertex perentation.  Apgars:8 ,9  Weight:  9 lb 9 oz .   Placenta status: spontaneous and Intact.  Cord: 3+ vessels;  with the following complications: none.  Anesthesia:  epidural Episiotomy:  none Lacerations:  2nd Suture Repair: 2.0 vicryl Est. Blood Loss (mL):  less than 100 mL  Mom to postpartum.  Baby to Couplet care / Skin to Skin.  Annamarie MajorPaul Lareen Mullings, MD, Merlinda FrederickFACOG [facility], [facility] 03/27/2017  3:49 PM [phone]

## 2016-07-25 ENCOUNTER — Telehealth: Payer: Self-pay

## 2016-07-25 NOTE — Telephone Encounter (Signed)
Pt calling.  She's about 5wks preg and having trouble breathing.  Was dx'd recently c asthma and has inhalers but wants to be sure okay to use c preg.  Inhalers are: albuterol and fluticazone.  Pt aware per PH okay to use.

## 2016-07-30 NOTE — Telephone Encounter (Signed)
Pt has been having trouble breathing. Called before to make sure it was ok to use inhalers. Last night she kept waking up after using her inhaler. It feels funny in her chest when she is breathing & she is uncertain what to do. [phone].

## 2016-07-30 NOTE — Telephone Encounter (Signed)
Spoke w/pt. Advised to contact PCP/prescribing physician.

## 2016-08-05 ENCOUNTER — Telehealth: Payer: Self-pay

## 2016-08-05 NOTE — Telephone Encounter (Signed)
Pt calling to see if it's okay for her to take travel sickness pills for her nausea?  [phone].

## 2016-08-06 NOTE — Telephone Encounter (Signed)
No meclazine.  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

## 2016-08-06 NOTE — Telephone Encounter (Signed)
Pt aware per SDJ not to take mecklazine.  Instead she may take vitamin B6  po tid c meals and Doxylamine 25-50mg  at bedtime, can take more during the day but best to start at night since it makes you drowsy.

## 2016-08-11 ENCOUNTER — Encounter: Payer: Self-pay | Admitting: Advanced Practice Midwife

## 2016-08-11 ENCOUNTER — Ambulatory Visit (INDEPENDENT_AMBULATORY_CARE_PROVIDER_SITE_OTHER): Payer: Medicaid Other | Admitting: Advanced Practice Midwife

## 2016-08-11 VITALS — BP 114/70 | Wt 130.0 lb

## 2016-08-11 DIAGNOSIS — Z3490 Encounter for supervision of normal pregnancy, unspecified, unspecified trimester: Secondary | ICD-10-CM

## 2016-08-11 DIAGNOSIS — Z113 Encounter for screening for infections with a predominantly sexual mode of transmission: Secondary | ICD-10-CM

## 2016-08-11 DIAGNOSIS — Z3480 Encounter for supervision of other normal pregnancy, unspecified trimester: Secondary | ICD-10-CM

## 2016-08-11 NOTE — Progress Notes (Signed)
New Obstetric Patient H&P    Chief Complaint: "Desires prenatal care"   History of Present Illness: Patient is a 21 y.o. [id number] Not Hispanic or Latino female, LMP 06/19/2016 presents with amenorrhea and positive home pregnancy test. Based on her  LMP, her EDD is Estimated Date of Delivery: 03/26/17 and her EGA is [redacted]w[redacted]d. Cycles are 7. days, regular, and occur approximately every : 28 days. Her last pap smear was 3 months ago and was no abnormalities.    She had a urine pregnancy test which was positive 3 week(s)  ago. Her last menstrual period was light and lasted for  5 day(s). Since her LMP she claims she has experienced breast tenderness, fatigue, nausea. She denies vaginal bleeding. Her past medical history is contributory for recent diagnosis of asthma. Her prior pregnancies are notable for 70 pound weight gain and 9 pound baby  Since her LMP, she admits to the use of tobacco products  No she quit with positive preg test She claims she has gained   5 pounds since the start of her pregnancy.  There are cats in the home in the home  no  She admits close contact with children on a regular basis  yes  She has had chicken pox in the past unknown She has had Tuberculosis exposures, symptoms, or previously tested positive for TB   no Current or past history of domestic violence. no  Genetic Screening/Teratology Counseling: (Includes patient, baby's father, or anyone in either family with:)   1. Patient's age >/= 91 at [facility]  no 2. Thalassemia ([place], [place], Mediterranean, or Asian background): MCV<80  no 3. Neural tube defect (meningomyelocele, spina bifida, anencephaly)  no 4. Congenital heart defect  no  5. Down syndrome  no 6. Tay-Sachs (Jewish, [place]  no 7. Canavan's Disease  no 8. Sickle cell disease or trait (African)  no  9. Hemophilia or other blood disorders  no  10. Muscular dystrophy  no  11. Cystic fibrosis  no  12. Huntington's Chorea  no  13. Mental  retardation/autism  no 14. Other inherited genetic or chromosomal disorder  no 15. Maternal metabolic disorder (DM, PKU, etc)  no 16. Patient or FOB with a child with a birth defect not listed above no  16a. Patient or FOB with a birth defect themselves no 17. Recurrent pregnancy loss, or stillbirth  no  18. Any medications since LMP other than prenatal vitamins (include vitamins, supplements, OTC meds, drugs, alcohol)  no 19. Any other genetic/environmental exposure to discuss  no  Infection History:   1. Lives with someone with TB or TB exposed  no  2. Patient or partner has history of genital herpes  no 3. Rash or viral illness since LMP  no 4. History of STI (GC, CT, HPV, syphilis, HIV)  no 5. History of recent travel :  no  Other pertinent information:  no     Review of Systems:10 point review of systems negative unless otherwise noted in HPI  Past Medical History:  Past Medical History:  Diagnosis Date  . Asthma   . Heart murmur     Past Surgical History:  No past surgical history on file.  Gynecologic History: Patient's last menstrual period was 06/19/2016.  Obstetric History: [id number]  Family History:  No family history on file.  Social History:  Social History   Social History  . Marital status: Married    Spouse name: N/A  . Number of  children: N/A  . Years of education: N/A   Occupational History  . Not on file.   Social History Main Topics  . Smoking status: Former Smoker    Packs/day: 0.12  . Smokeless tobacco: Never Used  . Alcohol use No  . Drug use: Yes    Types: Marijuana  . Sexual activity: Yes    Birth control/ protection: None   Other Topics Concern  . Not on file   Social History Narrative  . No narrative on file    Allergies:  No Known Allergies  Medications: Prior to Admission medications   Medication Sig Start Date End Date Taking? Authorizing Provider  fluticasone (FLOVENT DISKUS) 50 MCG/BLIST diskus inhaler Inhale 1  puff into the lungs 2 (two) times daily.   Yes [provider]  Prenatal Vit-Fe Fumarate-FA (PRENATAL VITAMIN PO) Take by mouth.   Yes [provider]  oxyCODONE-acetaminophen (ROXICET) 5-325 MG tablet Take 1-2 tablets by mouth every 6 (six) hours as needed. Patient not taking: Reported on 08/11/2016 03/25/15   Minna AntisPaduchowski, Kevin, MD  predniSONE (DELTASONE) 10 MG tablet Take 1 tablet (10 mg total) by mouth daily. Patient not taking: Reported on 08/11/2016 03/29/16   Cuthriell, Delorise RoyalsJonathan D, PA-C  sulfamethoxazole-trimethoprim (BACTRIM) 400-80 MG per tablet Take 1 tablet by mouth 2 (two) times daily. Patient not taking: Reported on 08/11/2016 10/21/14   Renford DillsMiller, Lindsey, NP    Physical Exam Vitals: Blood pressure 114/70, weight 130 lb (59 kg), last menstrual period 06/19/2016.  General: NAD HEENT: normocephalic, anicteric Thyroid: no enlargement, no palpable nodules Pulmonary: No increased work of breathing, CTAB Cardiovascular: RRR, distal pulses 2+ Abdomen: NABS, soft, non-tender, non-distended.  Umbilicus without lesions.  No hepatomegaly, splenomegaly or masses palpable. No evidence of hernia  Genitourinary:  External: Normal external female genitalia.  Normal urethral meatus, normal  Bartholin's and Skene's glands.    Vagina: Normal vaginal mucosa, no evidence of prolapse.    Cervix: Grossly normal in appearance, no bleeding, no CMT  Uterus: Enlarged, mobile, normal contour.    Adnexa: ovaries non-enlarged, no adnexal masses  Rectal: deferred Extremities: no edema, erythema, or tenderness Neurologic: Grossly intact Psychiatric: mood appropriate, affect full   Assessment: 21 y.o. G3P0011 at 5390w4d presenting to initiate prenatal care  Plan: 1) Avoid alcoholic beverages. 2) Patient encouraged not to smoke.  3) Discontinue the use of all non-medicinal drugs and chemicals.  4) Take prenatal vitamins daily.  5) Nutrition, food safety (fish, cheese advisories, and high  nitrite foods) and exercise discussed. Encouraged to eat healthy foods and increase activity for normal weight gain 6) Hospital and practice style discussed with cross coverage system.  7) Genetic Screening, such as with 1st Trimester Screening, cell free fetal DNA, AFP testing, and Ultrasound, as well as with amniocentesis and CVS as appropriate, is discussed with patient. At the conclusion of today's visit patient requested genetic testing 8) Patient is asked about travel to areas at risk for the Zika virus, and counseled to avoid travel and exposure to mosquitoes or sexual partners who may have themselves been exposed to the virus. Testing is discussed, and will be ordered as appropriate.    Tresea MallJane Janice Bodine, CNM

## 2016-08-11 NOTE — Progress Notes (Signed)
Referred for + pregnancy test

## 2016-08-11 NOTE — Patient Instructions (Signed)

## 2016-08-12 LAB — RPR+RH+ABO+RUB AB+AB SCR+CB...
ANTIBODY SCREEN: NEGATIVE
HIV Screen 4th Generation wRfx: NONREACTIVE
Hematocrit: 37 % (ref 34.0–46.6)
Hemoglobin: 12.3 g/dL (ref 11.1–15.9)
Hepatitis B Surface Ag: NEGATIVE
MCH: 28.8 pg (ref 26.6–33.0)
MCHC: 33.2 g/dL (ref 31.5–35.7)
MCV: 87 fL (ref 79–97)
PLATELETS: 262 10*3/uL (ref 150–379)
RBC: 4.27 x10E6/uL (ref 3.77–5.28)
RDW: 13.7 % (ref 12.3–15.4)
RPR Ser Ql: NONREACTIVE
Rh Factor: POSITIVE
Rubella Antibodies, IGG: 1.64 index (ref >0.99)
Varicella zoster IgG: 733 index (ref >165)
WBC: 10.3 10*3/uL (ref 3.4–10.8)

## 2016-08-13 LAB — GC/CHLAMYDIA PROBE AMP
CHLAMYDIA, DNA PROBE: NEGATIVE
NEISSERIA GONORRHOEAE BY PCR: NEGATIVE

## 2016-08-13 LAB — URINE CULTURE: Organism ID, Bacteria: NO GROWTH

## 2016-08-21 ENCOUNTER — Ambulatory Visit (INDEPENDENT_AMBULATORY_CARE_PROVIDER_SITE_OTHER): Payer: Medicaid Other | Admitting: Obstetrics and Gynecology

## 2016-08-21 ENCOUNTER — Ambulatory Visit (INDEPENDENT_AMBULATORY_CARE_PROVIDER_SITE_OTHER): Payer: Medicaid Other

## 2016-08-21 VITALS — BP 100/60 | Wt 132.0 lb

## 2016-08-21 DIAGNOSIS — Z3490 Encounter for supervision of normal pregnancy, unspecified, unspecified trimester: Secondary | ICD-10-CM | POA: Diagnosis not present

## 2016-08-21 DIAGNOSIS — Z3A09 9 weeks gestation of pregnancy: Secondary | ICD-10-CM

## 2016-08-21 DIAGNOSIS — Z3480 Encounter for supervision of other normal pregnancy, unspecified trimester: Secondary | ICD-10-CM

## 2016-08-21 NOTE — Progress Notes (Signed)
Dating U/S 

## 2016-08-21 NOTE — Progress Notes (Signed)
S=D on 9 week US today. 1st trimester screen ordered given mychart info

## 2016-08-21 NOTE — Patient Instructions (Signed)
First Trimester of Pregnancy The first trimester of pregnancy is from week 1 until the end of week 13 (months 1 through 3). A week after a sperm fertilizes an egg, the egg will implant on the wall of the uterus. This embryo will begin to develop into a baby. Genes from you and your partner will form the baby. The female genes will determine whether the baby will be a boy or a girl. At 6-8 weeks, the eyes and face will be formed, and the heartbeat can be seen on ultrasound. At the end of 12 weeks, all the baby's organs will be formed. Now that you are pregnant, you will want to do everything you can to have a healthy baby. Two of the most important things are to get good prenatal care and to follow your health care provider's instructions. Prenatal care is all the medical care you receive before the baby's birth. This care will help prevent, find, and treat any problems during the pregnancy and childbirth. Body changes during your first trimester Your body goes through many changes during pregnancy. The changes vary from woman to woman.  You may gain or lose a couple of pounds at first.  You may feel sick to your stomach (nauseous) and you may throw up (vomit). If the vomiting is uncontrollable, call your health care provider.  You may tire easily.  You may develop headaches that can be relieved by medicines. All medicines should be approved by your health care provider.  You may urinate more often. Painful urination may mean you have a bladder infection.  You may develop heartburn as a result of your pregnancy.  You may develop constipation because certain hormones are causing the muscles that push stool through your intestines to slow down.  You may develop hemorrhoids or swollen veins (varicose veins).  Your breasts may begin to grow larger and become tender. Your nipples may stick out more, and the tissue that surrounds them (areola) may become darker.  Your gums may bleed and may be  sensitive to brushing and flossing.  Dark spots or blotches (chloasma, mask of pregnancy) may develop on your face. This will likely fade after the baby is born.  Your menstrual periods will stop.  You may have a loss of appetite.  You may develop cravings for certain kinds of food.  You may have changes in your emotions from day to day, such as being excited to be pregnant or being concerned that something may go wrong with the pregnancy and baby.  You may have more vivid and strange dreams.  You may have changes in your hair. These can include thickening of your hair, rapid growth, and changes in texture. Some women also have hair loss during or after pregnancy, or hair that feels dry or thin. Your hair will most likely return to normal after your baby is born.  What to expect at prenatal visits During a routine prenatal visit:  You will be weighed to make sure you and the baby are growing normally.  Your blood pressure will be taken.  Your abdomen will be measured to track your baby's growth.  The fetal heartbeat will be listened to between weeks 10 and 14 of your pregnancy.  Test results from any previous visits will be discussed.  Your health care provider may ask you:  How you are feeling.  If you are feeling the baby move.  If you have had any abnormal symptoms, such as leaking fluid, bleeding, severe headaches,   or abdominal cramping.  If you are using any tobacco products, including cigarettes, chewing tobacco, and electronic cigarettes.  If you have any questions.  Other tests that may be performed during your first trimester include:  Blood tests to find your blood type and to check for the presence of any previous infections. The tests will also be used to check for low iron levels (anemia) and protein on red blood cells (Rh antibodies). Depending on your risk factors, or if you previously had diabetes during pregnancy, you may have tests to check for high blood  sugar that affects pregnant women (gestational diabetes).  Urine tests to check for infections, diabetes, or protein in the urine.  An ultrasound to confirm the proper growth and development of the baby.  Fetal screens for spinal cord problems (spina bifida) and Down syndrome.  HIV (human immunodeficiency virus) testing. Routine prenatal testing includes screening for HIV, unless you choose not to have this test.  You may need other tests to make sure you and the baby are doing well.  Follow these instructions at home: Medicines  Follow your health care provider's instructions regarding medicine use. Specific medicines may be either safe or unsafe to take during pregnancy.  Take a prenatal vitamin that contains at least 600 micrograms (mcg) of folic acid.  If you develop constipation, try taking a stool softener if your health care provider approves. Eating and drinking  Eat a balanced diet that includes fresh fruits and vegetables, whole grains, good sources of protein such as meat, eggs, or tofu, and low-fat dairy. Your health care provider will help you determine the amount of weight gain that is right for you.  Avoid raw meat and uncooked cheese. These carry germs that can cause birth defects in the baby.  Eating four or five small meals rather than three large meals a day may help relieve nausea and vomiting. If you start to feel nauseous, eating a few soda crackers can be helpful. Drinking liquids between meals, instead of during meals, also seems to help ease nausea and vomiting.  Limit foods that are high in fat and processed sugars, such as fried and sweet foods.  To prevent constipation: ? Eat foods that are high in fiber, such as fresh fruits and vegetables, whole grains, and beans. ? Drink enough fluid to keep your urine clear or pale yellow. Activity  Exercise only as directed by your health care provider. Most women can continue their usual exercise routine during  pregnancy. Try to exercise for 30 minutes at least 5 days a week. Exercising will help you: ? Control your weight. ? Stay in shape. ? Be prepared for labor and delivery.  Experiencing pain or cramping in the lower abdomen or lower back is a good sign that you should stop exercising. Check with your health care provider before continuing with normal exercises.  Try to avoid standing for long periods of time. Move your legs often if you must stand in one place for a long time.  Avoid heavy lifting.  Wear low-heeled shoes and practice good posture.  You may continue to have sex unless your health care provider tells you not to. Relieving pain and discomfort  Wear a good support bra to relieve breast tenderness.  Take warm sitz baths to soothe any pain or discomfort caused by hemorrhoids. Use hemorrhoid cream if your health care provider approves.  Rest with your legs elevated if you have leg cramps or low back pain.  If you develop   varicose veins in your legs, wear support hose. Elevate your feet for 15 minutes, 3-4 times a day. Limit salt in your diet. Prenatal care  Schedule your prenatal visits by the twelfth week of pregnancy. They are usually scheduled monthly at first, then more often in the last 2 months before delivery.  Write down your questions. Take them to your prenatal visits.  Keep all your prenatal visits as told by your health care provider. This is important. Safety  Wear your seat belt at all times when driving.  Make a list of emergency phone numbers, including numbers for family, friends, the hospital, and police and fire departments. General instructions  Ask your health care provider for a referral to a local prenatal education class. Begin classes no later than the beginning of month 6 of your pregnancy.  Ask for help if you have counseling or nutritional needs during pregnancy. Your health care provider can offer advice or refer you to specialists for help  with various needs.  Do not use hot tubs, steam rooms, or saunas.  Do not douche or use tampons or scented sanitary pads.  Do not cross your legs for long periods of time.  Avoid cat litter boxes and soil used by cats. These carry germs that can cause birth defects in the baby and possibly loss of the fetus by miscarriage or stillbirth.  Avoid all smoking, herbs, alcohol, and medicines not prescribed by your health care provider. Chemicals in these products affect the formation and growth of the baby.  Do not use any products that contain nicotine or tobacco, such as cigarettes and e-cigarettes. If you need help quitting, ask your health care provider. You may receive counseling support and other resources to help you quit.  Schedule a dentist appointment. At home, brush your teeth with a soft toothbrush and be gentle when you floss. Contact a health care provider if:  You have dizziness.  You have mild pelvic cramps, pelvic pressure, or nagging pain in the abdominal area.  You have persistent nausea, vomiting, or diarrhea.  You have a bad smelling vaginal discharge.  You have pain when you urinate.  You notice increased swelling in your face, hands, legs, or ankles.  You are exposed to fifth disease or chickenpox.  You are exposed to German measles (rubella) and have never had it. Get help right away if:  You have a fever.  You are leaking fluid from your vagina.  You have spotting or bleeding from your vagina.  You have severe abdominal cramping or pain.  You have rapid weight gain or loss.  You vomit blood or material that looks like coffee grounds.  You develop a severe headache.  You have shortness of breath.  You have any kind of trauma, such as from a fall or a car accident. Summary  The first trimester of pregnancy is from week 1 until the end of week 13 (months 1 through 3).  Your body goes through many changes during pregnancy. The changes vary from  woman to woman.  You will have routine prenatal visits. During those visits, your health care provider will examine you, discuss any test results you may have, and talk with you about how you are feeling. This information is not intended to replace advice given to you by your health care provider. Make sure you discuss any questions you have with your health care provider. Document Released: 03/18/2001 Document Revised: 03/05/2016 Document Reviewed: 03/05/2016 Elsevier Interactive Patient Education  2017 Elsevier   Inc.  

## 2016-08-27 ENCOUNTER — Encounter: Payer: Self-pay | Admitting: Obstetrics and Gynecology

## 2016-08-29 ENCOUNTER — Other Ambulatory Visit: Payer: Self-pay | Admitting: Advanced Practice Midwife

## 2016-08-29 ENCOUNTER — Encounter: Payer: Self-pay | Admitting: Advanced Practice Midwife

## 2016-08-29 DIAGNOSIS — O219 Vomiting of pregnancy, unspecified: Secondary | ICD-10-CM

## 2016-08-29 MED ORDER — DOXYLAMINE-PYRIDOXINE 10-10 MG PO TBEC: 2.0000 | DELAYED_RELEASE_TABLET | Freq: Every day | ORAL | 3 refills | Status: DC

## 2016-09-03 ENCOUNTER — Encounter: Payer: Self-pay | Admitting: Advanced Practice Midwife

## 2016-09-11 ENCOUNTER — Ambulatory Visit (INDEPENDENT_AMBULATORY_CARE_PROVIDER_SITE_OTHER): Payer: Medicaid Other

## 2016-09-11 ENCOUNTER — Ambulatory Visit (INDEPENDENT_AMBULATORY_CARE_PROVIDER_SITE_OTHER): Payer: Medicaid Other | Admitting: Obstetrics and Gynecology

## 2016-09-11 VITALS — BP 106/64 | Wt 133.0 lb

## 2016-09-11 DIAGNOSIS — Z3480 Encounter for supervision of other normal pregnancy, unspecified trimester: Secondary | ICD-10-CM

## 2016-09-11 DIAGNOSIS — Z362 Encounter for other antenatal screening follow-up: Secondary | ICD-10-CM

## 2016-09-11 DIAGNOSIS — Z3A09 9 weeks gestation of pregnancy: Secondary | ICD-10-CM | POA: Diagnosis not present

## 2016-09-11 DIAGNOSIS — Z3A12 12 weeks gestation of pregnancy: Secondary | ICD-10-CM

## 2016-09-11 NOTE — Progress Notes (Signed)
First trimester screen

## 2016-09-16 LAB — FIRST TRIMESTER SCREEN W/NT
CRL: 62.6 mm
DIA MoM: 1.15
DIA Value: 301.7 pg/mL
Gest Age-Collect: 12.6 weeks
Maternal Age At EDD: 22.5 yr
NUCHAL TRANSLUCENCY MOM: 1.01
Nuchal Translucency: 1.6 mm
Number of Fetuses: 1
PAPP-A MOM: 0.49
PAPP-A VALUE: 536.3 ng/mL
Test Results:: NEGATIVE
Weight: 133 [lb_av]
hCG MoM: 1.36
hCG Value: 129 IU/mL

## 2016-09-17 ENCOUNTER — Encounter: Payer: Self-pay | Admitting: Obstetrics and Gynecology

## 2016-09-22 ENCOUNTER — Encounter: Payer: Self-pay | Admitting: Obstetrics and Gynecology

## 2016-10-09 ENCOUNTER — Ambulatory Visit (INDEPENDENT_AMBULATORY_CARE_PROVIDER_SITE_OTHER): Payer: Self-pay | Admitting: Obstetrics and Gynecology

## 2016-10-09 VITALS — BP 114/70 | Wt 138.0 lb

## 2016-10-09 DIAGNOSIS — F418 Other specified anxiety disorders: Secondary | ICD-10-CM

## 2016-10-09 DIAGNOSIS — Z3A16 16 weeks gestation of pregnancy: Secondary | ICD-10-CM

## 2016-10-09 DIAGNOSIS — Z3480 Encounter for supervision of other normal pregnancy, unspecified trimester: Secondary | ICD-10-CM

## 2016-10-09 NOTE — Progress Notes (Signed)
Pt thinking she might have a UTI, no vb. No lof. Pt c/o being dizzy at times. No syncope. Occurs with certain movements/activities. Discussed hydration/positioning. Instructed her to go to ER if she passes out. Will send urine for culture.  No fevers, chills. Dysuria.  msAFP today.

## 2016-10-18 ENCOUNTER — Encounter: Payer: Self-pay | Admitting: Obstetrics and Gynecology

## 2016-10-18 ENCOUNTER — Encounter: Payer: Self-pay | Admitting: Advanced Practice Midwife

## 2016-10-28 ENCOUNTER — Telehealth: Payer: Self-pay

## 2016-10-28 NOTE — Telephone Encounter (Signed)
Pt almost 19w, was cramping, went to bathroom, clear yellowish gooey mucus came out a little bigger than a quarter.  Also c/o bloated feeling and pressure.  No other sxs.  Adv either normal preg d/c or part of mucus plug.  However, c the pressure she needs to be seen as it could be a UTI.  Appt sched for tomorrow am 9:20 c PH.  Adv pt if things worsen, continuous watery d/c, unable to stay dry, to go to ER.  May call after hour nurse, call L&D, or go to ER.  Adv she is under 20wks so they would probably keep her in the ER instead of sending her to L&D.

## 2016-10-29 ENCOUNTER — Encounter: Payer: Self-pay | Admitting: Obstetrics & Gynecology

## 2016-10-29 ENCOUNTER — Ambulatory Visit (INDEPENDENT_AMBULATORY_CARE_PROVIDER_SITE_OTHER): Payer: Medicaid Other | Admitting: Obstetrics & Gynecology

## 2016-10-29 VITALS — BP 110/60 | HR 77 | Ht 62.5 in | Wt 141.0 lb

## 2016-10-29 DIAGNOSIS — R350 Frequency of micturition: Secondary | ICD-10-CM

## 2016-10-29 DIAGNOSIS — N898 Other specified noninflammatory disorders of vagina: Secondary | ICD-10-CM

## 2016-10-29 LAB — POCT URINALYSIS DIPSTICK
Bilirubin, UA: NEGATIVE
Blood, UA: NEGATIVE
GLUCOSE UA: NEGATIVE
KETONES UA: NEGATIVE
Leukocytes, UA: NEGATIVE
Nitrite, UA: NEGATIVE
PROTEIN UA: NEGATIVE
SPEC GRAV UA: 1.015 (ref 1.010–1.025)
Urobilinogen, UA: 0.2 E.U./dL
pH, UA: 7 (ref 5.0–8.0)

## 2016-10-29 NOTE — Progress Notes (Signed)
HPI:      [name] is a 21 y.o. G3P0011 who LMP was Patient's last menstrual period was 06/19/2016., presents today for a problem visit.  She complains of:  Vaginitis: Patient complains of an abnormal vaginal discharge for 1 day. Vaginal symptoms include mucus like discharge yesterday )she is [redacted] weeks pregnant).Vulvar symptoms include none.STI Risk: Very low risk of STD exposureDischarge described as: mucoid.Other associated symptoms: none and no burning itching or odor.Menstrual pattern: She had been bleeding none. Prior NSVD.  Prios SAb (no D&C).  No prior LEEP or cervical procedures.  PMHx: She  has a past medical history of Asthma and Heart murmur. Also,  has no past surgical history on file., family history is not on file.,  reports that she has quit smoking. She has never used smokeless tobacco. She reports that she uses drugs, including Marijuana. She reports that she does not drink alcohol.  She has a current medication list which includes the following prescription(s): fluticasone and prenatal vit-fe fumarate-fa. Also, has No Known Allergies.  Review of Systems  Constitutional: Negative for chills, fever and malaise/fatigue.  HENT: Negative for congestion, sinus pain and sore throat.   Eyes: Negative for blurred vision and pain.  Respiratory: Negative for cough and wheezing.   Cardiovascular: Negative for chest pain and leg swelling.  Gastrointestinal: Negative for abdominal pain, constipation, diarrhea, heartburn, nausea and vomiting.  Genitourinary: Negative for dysuria, frequency, hematuria and urgency.  Musculoskeletal: Negative for back pain, joint pain, myalgias and neck pain.  Skin: Negative for itching and rash.  Neurological: Negative for dizziness, tremors and weakness.  Endo/Heme/Allergies: Does not bruise/bleed easily.  Psychiatric/Behavioral: Negative for depression. The patient is not nervous/anxious and does not have insomnia.     Objective: BP 110/60    Pulse 77   Ht 5' 2.5" (1.588 m)   Wt 141 lb (64 kg)   LMP 06/19/2016   BMI 25.38 kg/m  Physical Exam  Constitutional: She is oriented to person, place, and time. She appears well-developed and well-nourished. No distress.  Genitourinary: Vagina normal. Pelvic exam was performed with patient supine. There is no rash, tenderness or lesion on the right labia. There is no rash, tenderness or lesion on the left labia. No erythema or bleeding in the vagina. Right adnexum does not display mass and does not display tenderness. Left adnexum does not display mass and does not display tenderness. Cervix does not exhibit motion tenderness, discharge, polyp or nabothian cyst.   Uterus is enlarged and mobile.  Genitourinary Comments: Cx closed thick high, no softening Discharge present  Abdominal: Soft. She exhibits no distension. There is no tenderness.  FHT 140s  Musculoskeletal: Normal range of motion.  Neurological: She is alert and oriented to person, place, and time. No cranial nerve deficit.  Skin: Skin is warm and dry.  Psychiatric: She has a normal mood and affect.  No CVAT  Results for orders placed or performed in visit on 10/29/16  POCT urinalysis dipstick  Result Value Ref Range   Color, UA yellow    Clarity, UA clear    Glucose, UA neg    Bilirubin, UA neg    Ketones, UA neg    Spec Grav, UA 1.015 1.010 - 1.025   Blood, UA neg    pH, UA 7.0 5.0 - 8.0   Protein, UA neg    Urobilinogen, UA 0.2 0.2 or 1.0 E.U./dL   Nitrite, UA neg    Leukocytes, UA Negative Negative  ASSESSMENT/PLAN:    Visit Diagnoses    Urinary frequency    -  Primary   Relevant Orders   POCT urinalysis dipstick (Completed)   Vaginal discharge       Relevant Orders   No s/sx cervical incompetence Monitor sx's US (OB) next week No s/sx infection      Annamarie MajorPaul Harris, MD, Merlinda FrederickFACOG [facility], [facility] 10/29/2016  9:53 AM

## 2016-11-06 ENCOUNTER — Ambulatory Visit (INDEPENDENT_AMBULATORY_CARE_PROVIDER_SITE_OTHER): Payer: Medicaid Other | Admitting: Advanced Practice Midwife

## 2016-11-06 ENCOUNTER — Ambulatory Visit (INDEPENDENT_AMBULATORY_CARE_PROVIDER_SITE_OTHER): Payer: Medicaid Other

## 2016-11-06 VITALS — BP 118/74 | Wt 144.0 lb

## 2016-11-06 DIAGNOSIS — Z3480 Encounter for supervision of other normal pregnancy, unspecified trimester: Secondary | ICD-10-CM

## 2016-11-06 DIAGNOSIS — Z3A20 20 weeks gestation of pregnancy: Secondary | ICD-10-CM

## 2016-11-06 DIAGNOSIS — Z362 Encounter for other antenatal screening follow-up: Secondary | ICD-10-CM | POA: Diagnosis not present

## 2016-11-06 NOTE — Progress Notes (Signed)
No vb. No lof. Anatomy scan today. 

## 2016-11-06 NOTE — Progress Notes (Signed)
Anatomy scan incomplete for nose/lips. F/U anatomy nv. No Ctx's, LOF, VB. Good fetal movement.

## 2016-12-01 ENCOUNTER — Ambulatory Visit (INDEPENDENT_AMBULATORY_CARE_PROVIDER_SITE_OTHER): Payer: Medicaid Other | Admitting: Advanced Practice Midwife

## 2016-12-01 ENCOUNTER — Ambulatory Visit (INDEPENDENT_AMBULATORY_CARE_PROVIDER_SITE_OTHER): Payer: Medicaid Other

## 2016-12-01 VITALS — BP 96/52 | Wt 150.0 lb

## 2016-12-01 DIAGNOSIS — Z362 Encounter for other antenatal screening follow-up: Secondary | ICD-10-CM | POA: Diagnosis not present

## 2016-12-01 DIAGNOSIS — Z130 Encounter for screening for diseases of the blood and blood-forming organs and certain disorders involving the immune mechanism: Secondary | ICD-10-CM

## 2016-12-01 DIAGNOSIS — Z3480 Encounter for supervision of other normal pregnancy, unspecified trimester: Secondary | ICD-10-CM

## 2016-12-01 DIAGNOSIS — Z131 Encounter for screening for diabetes mellitus: Secondary | ICD-10-CM

## 2016-12-01 DIAGNOSIS — Z3A23 23 weeks gestation of pregnancy: Secondary | ICD-10-CM

## 2016-12-01 DIAGNOSIS — Z113 Encounter for screening for infections with a predominantly sexual mode of transmission: Secondary | ICD-10-CM

## 2016-12-01 NOTE — Progress Notes (Signed)
U/S today

## 2016-12-01 NOTE — Progress Notes (Signed)
F/U anatomy scan now complete. Discussion of delayed cord clamping and placenta options. Occasional BH ctx's, no LOF, VB. 28 wk labs nv.

## 2016-12-04 ENCOUNTER — Other Ambulatory Visit: Payer: Medicaid Other

## 2016-12-04 ENCOUNTER — Encounter: Payer: Medicaid Other | Admitting: Advanced Practice Midwife

## 2016-12-19 ENCOUNTER — Observation Stay
Admission: EM | Admit: 2016-12-19 | Discharge: 2016-12-19 | Disposition: A | Discharge status: Home / Self Care | Payer: Medicaid Other | Attending: Certified Nurse Midwife | Admitting: Certified Nurse Midwife

## 2016-12-19 ENCOUNTER — Encounter: Payer: Self-pay | Admitting: *Deleted

## 2016-12-19 DIAGNOSIS — R42 Dizziness and giddiness: Secondary | ICD-10-CM | POA: Diagnosis not present

## 2016-12-19 DIAGNOSIS — O26892 Other specified pregnancy related conditions, second trimester: Secondary | ICD-10-CM | POA: Diagnosis not present

## 2016-12-19 DIAGNOSIS — Z3A26 26 weeks gestation of pregnancy: Secondary | ICD-10-CM | POA: Diagnosis not present

## 2016-12-19 DIAGNOSIS — R0602 Shortness of breath: Secondary | ICD-10-CM | POA: Diagnosis not present

## 2016-12-19 LAB — CBC WITH DIFFERENTIAL/PLATELET
BASOS ABS: 0 10*3/uL (ref 0–0.1)
BASOS PCT: 1 %
Eosinophils Absolute: 0.1 10*3/uL (ref 0–0.7)
Eosinophils Relative: 1 %
HCT: 30 % — ABNORMAL LOW (ref 35.0–47.0)
HEMOGLOBIN: 10.2 g/dL — AB (ref 12.0–16.0)
LYMPHS PCT: 16 %
Lymphs Abs: 1.2 10*3/uL (ref 1.0–3.6)
MCH: 29.9 pg (ref 26.0–34.0)
MCHC: 34 g/dL (ref 32.0–36.0)
MCV: 87.8 fL (ref 80.0–100.0)
MONO ABS: 0.6 10*3/uL (ref 0.2–0.9)
Monocytes Relative: 7 %
NEUTROS ABS: 5.9 10*3/uL (ref 1.4–6.5)
NEUTROS PCT: 75 %
Platelets: 201 10*3/uL (ref 150–440)
RBC: 3.41 MIL/uL — AB (ref 3.80–5.20)
RDW: 13.6 % (ref 11.5–14.5)
WBC: 7.7 10*3/uL (ref 3.6–11.0)

## 2016-12-19 MED ORDER — FUSION 65-65-25-30 MG PO CAPS: 1.0000 | ORAL_CAPSULE | Freq: Every day | ORAL | 4 refills | Status: DC

## 2016-12-19 NOTE — OB Triage Note (Signed)
Presents with complaints of dizziness, shortness of breathe and abdominal soreness. . Pt has a history of asthma. Denies any bleeding, leaking fluid, cramping or contractions.

## 2016-12-19 NOTE — Final Progress Note (Signed)
Physician Final Progress Note  Patient ID: Allison Black MRN: [id number] DOB/AGE: 1995-10-02 21 y.o.  Admit date: 12/19/2016 Admitting provider: Nadara Mustard, MD Discharge date: 12/19/2016   Admission Diagnoses: IUP at 26wk1day with intermittent lightheadedness  Discharge Diagnoses:  Active Problems:   Episodic lightheadedness   Consults: None  Significant Findings/ Diagnostic Studies: 21 year old G3 P1011 with EDC=03/26/2017 by LMP=9week [place] who presents with complaints of feeling lightheaded and SOB intermittently over the last 3 days. Feels lightheaded at times when sitting down or standing. Has SOB at times, sometimes at rest and sometimes after exertion. No current SOB or lightheadedness. Has asthma- has used Albuterol inhaler 3x this week for SOB, but it did not help. Is not using daily inhaler. No wheezing. Having some nasal congestion and nosebleeds.  Baby active. No vaginal bleeding. Current medications: PNV without iron and Albuterol inhaler prn  Prenatal care at [facility] also remarkable for anxiety/ depression and asthma [facility]renatal [facility]  Dating  Blood type: B/Positive/-- (05/07 1117)   Genetic Screen 1 Screen:negative    AFP:     Antibody:Negative (05/07 1117)  Anatomic [place]  Rubella: 1.64 (05/07 1117) Varicella: I  GTT Early:               Third trimester:  RPR: Non Reactive (05/07 1117)   Rhogam  HBsAg: Negative (05/07 1117)   TDaP vaccine                       Flu Shot: HIV: Non Reactive (05/07 1117)   Baby Food                                GBS:   Contraception  Pap:  CBB     CS/VBAC    Support Person         Exam: BP (!) 99/58   Pulse 79   Temp 98.3 F (36.8 C) (Oral)   Resp 18   Ht  (1.575 m)   Wt 68 kg (150 lb)   LMP 06/19/2016   BMI 27.44 kg/m   General: WF, gravid in NAD Neck: no thyromegaly Heart: RRR without murmur Lungs: CTAB, normal respiratory effort Extremities: no edema or tenderness FHR: 150 baseline with accelerations  to 160s to 170s, moderate variability Toco: no contractions  Results for orders placed or performed during the hospital encounter of 12/19/16 (from the past 24 hour(s))  CBC with Differential/Platelet     Status: Abnormal   Collection Time: 12/19/16  1:25 PM  Result Value Ref Range   WBC 7.7 3.6 - 11.0 K/uL   RBC 3.41 (L) 3.80 - 5.20 MIL/uL   Hemoglobin 10.2 (L) 12.0 - 16.0 g/dL   HCT 40.9 (L) 81.1 - 91.4 %   MCV 87.8 80.0 - 100.0 fL   MCH 29.9 26.0 - 34.0 pg   MCHC 34.0 32.0 - 36.0 g/dL   RDW 78.2 95.6 - 21.3 %   Platelets 201 150 - 440 K/uL   Neutrophils Relative % 75 %   Neutro Abs 5.9 1.4 - 6.5 K/uL   Lymphocytes Relative 16 %   Lymphs Abs 1.2 1.0 - 3.6 K/uL   Monocytes Relative 7 %   Monocytes Absolute 0.6 0.2 - 0.9 K/uL   Eosinophils Relative 1 %   Eosinophils Absolute 0.1 0 - 0.7 K/uL   Basophils Relative 1 %   Basophils Absolute 0.0 0 -  0.1 K/uL   A: 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 P: DC home Start iron supplements-Fusion Plus Discussed "pedaling" feet when sitting or standing to help blood return to heart and to increase fluid intake. FU at [facility] as scheduled  Procedures: none  Discharge Condition: stable  Disposition: 01-Home or Self Care  Diet: Regular diet  Discharge Activity: Activity as tolerated   Allergies as of 12/19/2016   No Known Allergies     Medication List    TAKE these medications   ALBUTEROL SULFATE HFA IN Inhale 2 puffs into the lungs every 4 (four) hours as needed (wheezing).   fluticasone 50 MCG/BLIST diskus inhaler Commonly known as:  FLOVENT DISKUS Inhale 1 puff into the lungs 2 (two) times daily.   PRENATAL VITAMIN PO Take by mouth.        Total time spent taking care of this patient: 15 minutes  Signed: Farrel Conners 12/19/2016, 1:38 PM

## 2016-12-19 NOTE — Discharge Instructions (Signed)
Keep next scheduled appt on Sept 24 with [facility].

## 2016-12-19 NOTE — Plan of Care (Signed)
Discharge instructions, both oral and written, given to pat and SO.  Pt has next OB appt Sept 24 with [facility].  Pt agrees with plan of care and is leaving dept in stable condition ambulatory with family. CNM to call pt with CBC results from today on given phone number.  Ellison Carwin RNC

## 2016-12-22 ENCOUNTER — Telehealth: Payer: Self-pay

## 2016-12-22 NOTE — Telephone Encounter (Signed)
Pt called after hour nurse 12/19/16 at 11:08am c/o 26wks, getting dizzy and lightheaded for the past few days, normally when she gets up, even when sitting down.  Was adv to go to ED/L&D where she was evaluated and sent home to keep appt on 9/24th as sched.

## 2016-12-29 ENCOUNTER — Ambulatory Visit (INDEPENDENT_AMBULATORY_CARE_PROVIDER_SITE_OTHER): Payer: Medicaid Other | Admitting: Maternal Newborn

## 2016-12-29 ENCOUNTER — Other Ambulatory Visit: Payer: Medicaid Other

## 2016-12-29 VITALS — BP 98/52 | Wt 159.0 lb

## 2016-12-29 DIAGNOSIS — Z3480 Encounter for supervision of other normal pregnancy, unspecified trimester: Secondary | ICD-10-CM

## 2016-12-29 DIAGNOSIS — Z113 Encounter for screening for infections with a predominantly sexual mode of transmission: Secondary | ICD-10-CM

## 2016-12-29 DIAGNOSIS — Z130 Encounter for screening for diseases of the blood and blood-forming organs and certain disorders involving the immune mechanism: Secondary | ICD-10-CM

## 2016-12-29 DIAGNOSIS — Z131 Encounter for screening for diabetes mellitus: Secondary | ICD-10-CM

## 2016-12-29 DIAGNOSIS — Z3A27 27 weeks gestation of pregnancy: Secondary | ICD-10-CM

## 2016-12-29 NOTE — Progress Notes (Signed)
    Routine Prenatal Care Visit  Subjective  Allison Black is a 21 y.o. G3P1011 at [redacted]w[redacted]d being seen today for ongoing prenatal care.  She is currently monitored for the following issues for this low-risk pregnancy and has Supervision of other normal pregnancy, antepartum; Depression with anxiety; and Episodic lightheadedness on her problem list.  ----------------------------------------------------------------------------------- Patient reports no complaints.   Contractions: Not present. Vag. Bleeding: None.  Movement: Present. Denies leaking of fluid.  ----------------------------------------------------------------------------------- The following portions of the patient's history were reviewed and updated as appropriate: allergies, current medications, past family history, past medical history, past social history, past surgical history and problem list. Problem list updated.   Objective  Blood pressure (!) 98/52, weight 159 lb (72.1 kg), last menstrual period 06/19/2016. Pregravid weight 130 lb (59 kg) Total Weight Gain 29 lb (13.2 kg) Urinalysis: Urine Protein: Negative Urine Glucose: Negative  Fetal Status: Fetal Heart Rate (bpm): 145   Movement: Present     General:  Alert, oriented and cooperative. Patient is in no acute distress.  Skin: Skin is warm and dry. No rash noted.   Cardiovascular: Normal heart rate noted  Respiratory: Normal respiratory effort, no problems with respiration noted  Abdomen: Soft, gravid, appropriate for gestational age. Pain/Pressure: Absent     Pelvic:  Cervical exam deferred        Extremities: Normal range of motion.  Edema: None  Mental Status: Normal mood and affect. Normal behavior. Normal judgment and thought content.     Assessment   21 y.o. G3P1011 at [redacted]w[redacted]d by  03/26/2017, by Last Menstrual Period presenting for routine prenatal visit  Plan   pregnancy Problems (from 08/11/16 to present)    Problem Noted Resolved   Depression with  anxiety 10/09/2016 by Conard Novak, MD No    28 week labs today.  Preterm labor symptoms and general obstetric precautions including but not limited to vaginal bleeding, contractions, leaking of fluid and fetal movement were reviewed in detail with the patient.  Return in about 2 weeks (around 01/12/2017) for ROB.  Marcelyn Bruins, CNM 12/29/2016  10:02 AM

## 2016-12-30 LAB — 28 WEEK RH+PANEL
BASOS ABS: 0 10*3/uL (ref 0.0–0.2)
Basos: 0 %
EOS (ABSOLUTE): 0.1 10*3/uL (ref 0.0–0.4)
Eos: 1 %
GESTATIONAL DIABETES SCREEN: 139 mg/dL (ref 65–139)
HEMATOCRIT: 29.8 % — AB (ref 34.0–46.6)
HIV SCREEN 4TH GENERATION: NONREACTIVE
Hemoglobin: 9.9 g/dL — ABNORMAL LOW (ref 11.1–15.9)
IMMATURE GRANS (ABS): 0 10*3/uL (ref 0.0–0.1)
IMMATURE GRANULOCYTES: 0 %
LYMPHS ABS: 1.1 10*3/uL (ref 0.7–3.1)
Lymphs: 13 %
MCH: 29 pg (ref 26.6–33.0)
MCHC: 33.2 g/dL (ref 31.5–35.7)
MCV: 87 fL (ref 79–97)
MONOS ABS: 0.4 10*3/uL (ref 0.1–0.9)
Monocytes: 5 %
NEUTROS PCT: 81 %
Neutrophils Absolute: 6.8 10*3/uL (ref 1.4–7.0)
PLATELETS: 226 10*3/uL (ref 150–379)
RBC: 3.41 x10E6/uL — ABNORMAL LOW (ref 3.77–5.28)
RDW: 13.8 % (ref 12.3–15.4)
RPR: NONREACTIVE
WBC: 8.4 10*3/uL (ref 3.4–10.8)

## 2017-01-05 ENCOUNTER — Encounter: Payer: Self-pay | Admitting: Advanced Practice Midwife

## 2017-01-06 NOTE — Telephone Encounter (Signed)
This encounter was created in error - please disregard.

## 2017-01-12 ENCOUNTER — Ambulatory Visit (INDEPENDENT_AMBULATORY_CARE_PROVIDER_SITE_OTHER): Payer: Medicaid Other | Admitting: Obstetrics and Gynecology

## 2017-01-12 VITALS — BP 104/68 | Wt 167.0 lb

## 2017-01-12 DIAGNOSIS — O99013 Anemia complicating pregnancy, third trimester: Secondary | ICD-10-CM

## 2017-01-12 DIAGNOSIS — Z3A29 29 weeks gestation of pregnancy: Secondary | ICD-10-CM

## 2017-01-12 DIAGNOSIS — Z3480 Encounter for supervision of other normal pregnancy, unspecified trimester: Secondary | ICD-10-CM

## 2017-01-12 DIAGNOSIS — F418 Other specified anxiety disorders: Secondary | ICD-10-CM

## 2017-01-12 NOTE — Patient Instructions (Signed)
Third Trimester of Pregnancy The third trimester is from week 28 through week 40 (months 7 through 9). The third trimester is a time when the unborn baby (fetus) is growing rapidly. At the end of the ninth month, the fetus is about 20 inches in length and weighs 6-10 pounds. Body changes during your third trimester Your body will continue to go through many changes during pregnancy. The changes vary from woman to woman. During the third trimester:  Your weight will continue to increase. You can expect to gain 25-35 pounds (11-16 kg) by the end of the pregnancy.  You may begin to get stretch marks on your hips, abdomen, and breasts.  You may urinate more often because the fetus is moving lower into your pelvis and pressing on your bladder.  You may develop or continue to have heartburn. This is caused by increased hormones that slow down muscles in the digestive tract.  You may develop or continue to have constipation because increased hormones slow digestion and cause the muscles that push waste through your intestines to relax.  You may develop hemorrhoids. These are swollen veins (varicose veins) in the rectum that can itch or be painful.  You may develop swollen, bulging veins (varicose veins) in your legs.  You may have increased body aches in the pelvis, back, or thighs. This is due to weight gain and increased hormones that are relaxing your joints.  You may have changes in your hair. These can include thickening of your hair, rapid growth, and changes in texture. Some women also have hair loss during or after pregnancy, or hair that feels dry or thin. Your hair will most likely return to normal after your baby is born.  Your breasts will continue to grow and they will continue to become tender. A yellow fluid (colostrum) may leak from your breasts. This is the first milk you are producing for your baby.  Your belly button may stick out.  You may notice more swelling in your hands,  face, or ankles.  You may have increased tingling or numbness in your hands, arms, and legs. The skin on your belly may also feel numb.  You may feel short of breath because of your expanding uterus.  You may have more problems sleeping. This can be caused by the size of your belly, increased need to urinate, and an increase in your body's metabolism.  You may notice the fetus "dropping," or moving lower in your abdomen (lightening).  You may have increased vaginal discharge.  You may notice your joints feel loose and you may have pain around your pelvic bone.  What to expect at prenatal visits You will have prenatal exams every 2 weeks until week 36. Then you will have weekly prenatal exams. During a routine prenatal visit:  You will be weighed to make sure you and the baby are growing normally.  Your blood pressure will be taken.  Your abdomen will be measured to track your baby's growth.  The fetal heartbeat will be listened to.  Any test results from the previous visit will be discussed.  You may have a cervical check near your due date to see if your cervix has softened or thinned (effaced).  You will be tested for Group B streptococcus. This happens between 35 and 37 weeks.  Your health care provider may ask you:  What your birth plan is.  How you are feeling.  If you are feeling the baby move.  If you have had   any abnormal symptoms, such as leaking fluid, bleeding, severe headaches, or abdominal cramping.  If you are using any tobacco products, including cigarettes, chewing tobacco, and electronic cigarettes.  If you have any questions.  Other tests or screenings that may be performed during your third trimester include:  Blood tests that check for low iron levels (anemia).  Fetal testing to check the health, activity level, and growth of the fetus. Testing is done if you have certain medical conditions or if there are problems during the  pregnancy.  Nonstress test (NST). This test checks the health of your baby to make sure there are no signs of problems, such as the baby not getting enough oxygen. During this test, a belt is placed around your belly. The baby is made to move, and its heart rate is monitored during movement.  What is false labor? False labor is a condition in which you feel small, irregular tightenings of the muscles in the womb (contractions) that usually go away with rest, changing position, or drinking water. These are called Braxton Hicks contractions. Contractions may last for hours, days, or even weeks before true labor sets in. If contractions come at regular intervals, become more frequent, increase in intensity, or become painful, you should see your health care provider. What are the signs of labor?  Abdominal cramps.  Regular contractions that start at 10 minutes apart and become stronger and more frequent with time.  Contractions that start on the top of the uterus and spread down to the lower abdomen and back.  Increased pelvic pressure and dull back pain.  A watery or bloody mucus discharge that comes from the vagina.  Leaking of amniotic fluid. This is also known as your "water breaking." It could be a slow trickle or a gush. Let your health care provider know if it has a color or strange odor. If you have any of these signs, call your health care provider right away, even if it is before your due date. Follow these instructions at home: Medicines  Follow your health care provider's instructions regarding medicine use. Specific medicines may be either safe or unsafe to take during pregnancy.  Take a prenatal vitamin that contains at least 600 micrograms (mcg) of folic acid.  If you develop constipation, try taking a stool softener if your health care provider approves. Eating and drinking  Eat a balanced diet that includes fresh fruits and vegetables, whole grains, good sources of protein  such as meat, eggs, or tofu, and low-fat dairy. Your health care provider will help you determine the amount of weight gain that is right for you.  Avoid raw meat and uncooked cheese. These carry germs that can cause birth defects in the baby.  If you have low calcium intake from food, talk to your health care provider about whether you should take a daily calcium supplement.  Eat four or five small meals rather than three large meals a day.  Limit foods that are high in fat and processed sugars, such as fried and sweet foods.  To prevent constipation: ? Drink enough fluid to keep your urine clear or pale yellow. ? Eat foods that are high in fiber, such as fresh fruits and vegetables, whole grains, and beans. Activity  Exercise only as directed by your health care provider. Most women can continue their usual exercise routine during pregnancy. Try to exercise for 30 minutes at least 5 days a week. Stop exercising if you experience uterine contractions.  Avoid heavy   lifting.  Do not exercise in extreme heat or humidity, or at high altitudes.  Wear low-heel, comfortable shoes.  Practice good posture.  You may continue to have sex unless your health care provider tells you otherwise. Relieving pain and discomfort  Take frequent breaks and rest with your legs elevated if you have leg cramps or low back pain.  Take warm sitz baths to soothe any pain or discomfort caused by hemorrhoids. Use hemorrhoid cream if your health care provider approves.  Wear a good support bra to prevent discomfort from breast tenderness.  If you develop varicose veins: ? Wear support pantyhose or compression stockings as told by your healthcare provider. ? Elevate your feet for 15 minutes, 3-4 times a day. Prenatal care  Write down your questions. Take them to your prenatal visits.  Keep all your prenatal visits as told by your health care provider. This is important. Safety  Wear your seat belt at  all times when driving.  Make a list of emergency phone numbers, including numbers for family, friends, the hospital, and police and fire departments. General instructions  Avoid cat litter boxes and soil used by cats. These carry germs that can cause birth defects in the baby. If you have a cat, ask someone to clean the litter box for you.  Do not travel far distances unless it is absolutely necessary and only with the approval of your health care provider.  Do not use hot tubs, steam rooms, or saunas.  Do not drink alcohol.  Do not use any products that contain nicotine or tobacco, such as cigarettes and e-cigarettes. If you need help quitting, ask your health care provider.  Do not use any medicinal herbs or unprescribed drugs. These chemicals affect the formation and growth of the baby.  Do not douche or use tampons or scented sanitary pads.  Do not cross your legs for long periods of time.  To prepare for the arrival of your baby: ? Take prenatal classes to understand, practice, and ask questions about labor and delivery. ? Make a trial run to the hospital. ? Visit the hospital and tour the maternity area. ? Arrange for maternity or paternity leave through employers. ? Arrange for family and friends to take care of pets while you are in the hospital. ? Purchase a rear-facing car seat and make sure you know how to install it in your car. ? Pack your hospital bag. ? Prepare the baby's nursery. Make sure to remove all pillows and stuffed animals from the baby's crib to prevent suffocation.  Visit your dentist if you have not gone during your pregnancy. Use a soft toothbrush to brush your teeth and be gentle when you floss. Contact a health care provider if:  You are unsure if you are in labor or if your water has broken.  You become dizzy.  You have mild pelvic cramps, pelvic pressure, or nagging pain in your abdominal area.  You have lower back pain.  You have persistent  nausea, vomiting, or diarrhea.  You have an unusual or bad smelling vaginal discharge.  You have pain when you urinate. Get help right away if:  Your water breaks before 37 weeks.  You have regular contractions less than 5 minutes apart before 37 weeks.  You have a fever.  You are leaking fluid from your vagina.  You have spotting or bleeding from your vagina.  You have severe abdominal pain or cramping.  You have rapid weight loss or weight gain.    You have shortness of breath with chest pain.  You notice sudden or extreme swelling of your face, hands, ankles, feet, or legs.  Your baby makes fewer than 10 movements in 2 hours.  You have severe headaches that do not go away when you take medicine.  You have vision changes. Summary  The third trimester is from week 28 through week 40, months 7 through 9. The third trimester is a time when the unborn baby (fetus) is growing rapidly.  During the third trimester, your discomfort may increase as you and your baby continue to gain weight. You may have abdominal, leg, and back pain, sleeping problems, and an increased need to urinate.  During the third trimester your breasts will keep growing and they will continue to become tender. A yellow fluid (colostrum) may leak from your breasts. This is the first milk you are producing for your baby.  False labor is a condition in which you feel small, irregular tightenings of the muscles in the womb (contractions) that eventually go away. These are called Braxton Hicks contractions. Contractions may last for hours, days, or even weeks before true labor sets in.  Signs of labor can include: abdominal cramps; regular contractions that start at 10 minutes apart and become stronger and more frequent with time; watery or bloody mucus discharge that comes from the vagina; increased pelvic pressure and dull back pain; and leaking of amniotic fluid. This information is not intended to replace advice  given to you by your health care provider. Make sure you discuss any questions you have with your health care provider. Document Released: 03/18/2001 Document Revised: 08/30/2015 Document Reviewed: 05/25/2012 Elsevier Interactive Patient Education  2017 [employer]  

## 2017-01-12 NOTE — Progress Notes (Signed)
  Routine Prenatal Care Visit  Subjective  Allison Black is a 21 y.o. G3P1011 at [redacted]w[redacted]d being seen today for ongoing prenatal care.  She is currently monitored for the following issues for this low-risk pregnancy and has Supervision of other normal pregnancy, antepartum; Depression with anxiety; Episodic lightheadedness; and Anemia affecting pregnancy in third trimester on her problem list.  ----------------------------------------------------------------------------------- Patient reports no complaints.   Contractions: Not present. Vag. Bleeding: None.  Movement: Present. Denies leaking of fluid.  ----------------------------------------------------------------------------------- The following portions of the patient's history were reviewed and updated as appropriate: allergies, current medications, past family history, past medical history, past social history, past surgical history and problem list. Problem list updated.   Objective  Blood pressure 104/68, weight 167 lb (75.8 kg), last menstrual period 06/19/2016. Pregravid weight 130 lb (59 kg) Total Weight Gain 37 lb (16.8 kg) Urinalysis: Urine Protein: Negative Urine Glucose: Negative  Fetal Status: Fetal Heart Rate (bpm): 145 Fundal Height: 30 cm Movement: Present     General:  Alert, oriented and cooperative. Patient is in no acute distress.  Skin: Skin is warm and dry. No rash noted.   Cardiovascular: Normal heart rate noted  Respiratory: Normal respiratory effort, no problems with respiration noted  Abdomen: Soft, gravid, appropriate for gestational age. Pain/Pressure: Absent     Pelvic:  Cervical exam deferred        Extremities: Normal range of motion.  Edema: None  Mental Status: Normal mood and affect. Normal behavior. Normal judgment and thought content.   Assessment   21 y.o. G3P1011 at [redacted]w[redacted]d by  03/26/2017, by Last Menstrual Period presenting for routine prenatal visit  Plan   pregnancy Problems (from 08/11/16 to  present)    Problem Noted Resolved   Anemia affecting pregnancy in third trimester 01/12/2017 by Conard Novak, MD No   Overview Signed 01/12/2017 11:43 AM by Conard Novak, MD     start iron sulfate BID      Depression with anxiety 10/09/2016 by Conard Novak, MD No   Supervision of other normal pregnancy, antepartum 08/21/2016 by Vena Austria, MD No   Overview Addendum 12/19/2016  2:18 PM by Farrel Conners, CNM    Clinic [facility] Prenatal Labs  Dating  Blood type: B/Positive/-- (05/07 1117)   Genetic Screen 1 Screen:negative    AFP:     Antibody:Negative (05/07 1117)  Anatomic [place]  Rubella: 1.64 (05/07 1117) Varicella: I  GTT Early:               Third trimester:  RPR: Non Reactive (05/07 1117)   Rhogam  HBsAg: Negative (05/07 1117)   TDaP vaccine                       Flu Shot: HIV: Non Reactive (05/07 1117)   Baby Food                                GBS:   Contraception  Pap:  9/14-mild anemia-Fusion Plus called in         Preterm labor symptoms and general obstetric precautions including but not limited to vaginal bleeding, contractions, leaking of fluid and fetal movement were reviewed in detail with the patient. Please refer to After Visit Summary for other counseling recommendations.   Return in about 2 weeks (around 01/26/2017) for Routine Prenatal Appointment.  Thomasene Mohair, MD  01/12/2017 11:51 AM

## 2017-01-16 ENCOUNTER — Encounter: Payer: Self-pay | Admitting: Obstetrics and Gynecology

## 2017-01-27 ENCOUNTER — Ambulatory Visit (INDEPENDENT_AMBULATORY_CARE_PROVIDER_SITE_OTHER): Payer: Medicaid Other | Admitting: Obstetrics and Gynecology

## 2017-01-27 VITALS — BP 98/50 | Wt 169.0 lb

## 2017-01-27 DIAGNOSIS — Z3A31 31 weeks gestation of pregnancy: Secondary | ICD-10-CM | POA: Diagnosis not present

## 2017-01-27 DIAGNOSIS — O99013 Anemia complicating pregnancy, third trimester: Secondary | ICD-10-CM

## 2017-01-27 DIAGNOSIS — Z3480 Encounter for supervision of other normal pregnancy, unspecified trimester: Secondary | ICD-10-CM

## 2017-01-27 DIAGNOSIS — Z23 Encounter for immunization: Secondary | ICD-10-CM | POA: Insufficient documentation

## 2017-01-27 DIAGNOSIS — F418 Other specified anxiety disorders: Secondary | ICD-10-CM

## 2017-01-27 NOTE — Progress Notes (Signed)
Routine Prenatal Care Visit  Subjective  Allison Black is a 21 y.o. G3P1011 at 1490w5d being seen today for ongoing prenatal care.  She is currently monitored for the following issues for this low-risk pregnancy and has Supervision of other normal pregnancy, antepartum; Depression with anxiety; Episodic lightheadedness; Anemia affecting pregnancy in third trimester; and Need for immunization against influenza on her problem list.  ----------------------------------------------------------------------------------- Patient reports no complaints.   Contractions: Not present. Vag. Bleeding: None.  Movement: Present. Denies leaking of fluid.  ----------------------------------------------------------------------------------- The following portions of the patient's history were reviewed and updated as appropriate: allergies, current medications, past family history, past medical history, past social history, past surgical history and problem list. Problem list updated.   Objective  Blood pressure (!) 98/50, weight 169 lb (76.7 kg), last menstrual period 06/19/2016. Pregravid weight 130 lb (59 kg) Total Weight Gain 39 lb (17.7 kg) Urinalysis: Urine Protein: Negative Urine Glucose: Negative  Fetal Status: Fetal Heart Rate (bpm): 140 Fundal Height: 32 cm Movement: Present     General:  Alert, oriented and cooperative. Patient is in no acute distress.  Skin: Skin is warm and dry. No rash noted.   Cardiovascular: Normal heart rate noted  Respiratory: Normal respiratory effort, no problems with respiration noted  Abdomen: Soft, gravid, appropriate for gestational age. Pain/Pressure: Absent     Pelvic:  Cervical exam deferred        Extremities: Normal range of motion.  Edema: None  ental Status: Normal mood and affect. Normal behavior. Normal judgment and thought content.    Immunization History  Administered Date(s) Administered  . Influenza,inj,Quad PF,6+ Mos 01/27/2017  . Tdap 01/27/2017      Assessment   21 y.o. G3P1011 at 9590w5d by  03/26/2017, by Last Menstrual Period presenting for routine prenatal visit  Plan   pregnancy Problems (from 08/11/16 to present)    Problem Noted Resolved   Anemia affecting pregnancy in third trimester 01/12/2017 by Conard NovakJackson, Stephen D, MD No   Overview Signed 01/12/2017 11:43 AM by Conard NovakJackson, Stephen D, MD    [ ]  start iron sulfate BID      Depression with anxiety 10/09/2016 by Conard NovakJackson, Stephen D, MD No   Supervision of other normal pregnancy, antepartum 08/21/2016 by Vena AustriaStaebler, Symeon Puleo, MD No   Overview Addendum 01/27/2017  5:40 PM by Vena AustriaStaebler, Javis Abboud, MD    [facility]inic [facility] Prenatal Labs  Dating LMP = 9 week US Blood type: B/Positive/-- (05/07 1117)   Genetic Screen 1 Screen:negative      Antibody:Negative (05/07 1117)  Anatomic US Complete normal female Rubella: 1.64 (05/07 1117) Varicella: Immune  GTT Third 139 trimester:  RPR: Non Reactive (05/07 1117)   Rhogam N/A HBsAg: Negative (05/07 1117)   TDaP vaccine 01/27/17 Flu Shot: 01/27/17 HIV: Non Reactive (05/07 1117)   Baby Food                                GBS:   Contraception  Pap:  CBB     CS/VBAC    Support Person         9/14-mild anemia-Fusion Plus called in          Preterm labor symptoms and general obstetric precautions including but not limited to vaginal bleeding, contractions, leaking of fluid and fetal movement were reviewed in detail with the patient. Please refer to After Visit Summary for other counseling recommendations.  - TDAP and influenza vaccination today  Return in about 2 weeks (around 02/10/2017) for ROB.

## 2017-01-27 NOTE — Progress Notes (Signed)
ROB TDAP/blood consent today Flu vaccine given

## 2017-02-03 ENCOUNTER — Encounter: Payer: Self-pay | Admitting: Obstetrics and Gynecology

## 2017-02-04 ENCOUNTER — Telehealth: Payer: Self-pay

## 2017-02-04 NOTE — Telephone Encounter (Signed)
Pt saw primary MD today, dx'd c bronchitis, was given zpac, okay to take?  [phone].  Pt aware zpac okay to take as long as not allergic to it.

## 2017-02-10 ENCOUNTER — Encounter: Payer: Self-pay | Admitting: Maternal Newborn

## 2017-02-10 ENCOUNTER — Ambulatory Visit (INDEPENDENT_AMBULATORY_CARE_PROVIDER_SITE_OTHER): Payer: Medicaid Other | Admitting: Maternal Newborn

## 2017-02-10 VITALS — BP 90/50 | Wt 168.0 lb

## 2017-02-10 DIAGNOSIS — O99513 Diseases of the respiratory system complicating pregnancy, third trimester: Secondary | ICD-10-CM

## 2017-02-10 DIAGNOSIS — Z3480 Encounter for supervision of other normal pregnancy, unspecified trimester: Secondary | ICD-10-CM

## 2017-02-10 DIAGNOSIS — J45909 Unspecified asthma, uncomplicated: Secondary | ICD-10-CM | POA: Insufficient documentation

## 2017-02-10 DIAGNOSIS — Z3A33 33 weeks gestation of pregnancy: Secondary | ICD-10-CM

## 2017-02-10 NOTE — Progress Notes (Signed)
    Routine Prenatal Care Visit  Subjective  Allison Black is a 21 y.o. G3P1011 at 2834w5d being seen today for ongoing prenatal care.  She is currently monitored for the following issues for this low-risk pregnancy and has Supervision of other normal pregnancy, antepartum; Depression with anxiety; Episodic lightheadedness; Anemia affecting pregnancy in third trimester; and Need for immunization against influenza on their problem list.  ----------------------------------------------------------------------------------- Patient reports recovering from bronchitis, symptoms improving with antibiotics.   Contractions: Not present. Vag. Bleeding: None.  Movement: Present. Denies leaking of fluid.  ----------------------------------------------------------------------------------- The following portions of the patient's history were reviewed and updated as appropriate: allergies, current medications, past family history, past medical history, past social history, past surgical history and problem list. Problem list updated.   Objective  Blood pressure (!) 90/50, weight 168 lb (76.2 kg), last menstrual period 06/19/2016. Pregravid weight 130 lb (59 kg) Total Weight Gain 38 lb (17.2 kg) Urinalysis: Urine Protein: Negative Urine Glucose: Negative  Fetal Status: Fetal Heart Rate (bpm): 158 Fundal Height: 33 cm Movement: Present     General:  Alert, oriented and cooperative. Patient is in no acute distress.  Skin: Skin is warm and dry. No rash noted.   Cardiovascular: Normal heart rate noted  Respiratory: Normal respiratory effort, no problems with respiration noted  Abdomen: Soft, gravid, appropriate for gestational age. Pain/Pressure: Absent     Pelvic:  Cervical exam deferred        Extremities: Normal range of motion.  Edema: Trace  Mental Status: Normal mood and affect. Normal behavior. Normal judgment and thought content.     Assessment   21 y.o. G3P1011 at 5234w5d by  03/26/2017, by Last  Menstrual Period presenting for routine prenatal visit.  Plan   pregnancy Problems (from 08/11/16 to present)    Problem Noted Resolved   Anemia affecting pregnancy in third trimester 01/12/2017 by Conard NovakJackson, Stephen D, MD No   Overview Signed 01/12/2017 11:43 AM by Conard NovakJackson, Stephen D, MD    [X]  start iron sulfate BID      Depression with anxiety 10/09/2016 by Conard NovakJackson, Stephen D, MD No   Supervision of other normal pregnancy, antepartum 08/21/2016 by Vena AustriaStaebler, Andreas, MD No   Overview Addendum 01/27/2017  5:40 PM by Vena AustriaStaebler, Andreas, MD    [facility] Prenatal Labs  Dating LMP = 9 week US Blood type: B/Positive/-- (05/07 1117)   Genetic Screen 1 Screen:negative      Antibody:Negative (05/07 1117)  Anatomic US Complete normal female Rubella: 1.64 (05/07 1117) Varicella: Immune  GTT Third 139 trimester:  RPR: Non Reactive (05/07 1117)   Rhogam N/A HBsAg: Negative (05/07 1117)   TDaP vaccine 01/27/17 Flu Shot: 01/27/17 HIV: Non Reactive (05/07 1117)   Baby Food                                GBS:   Contraception  Pap:  CBB     CS/VBAC    Support Person         9/14-mild anemia-Fusion Plus called in       Using rescue inhaler PRN, no worsening of asthma sx. Encouraged extra fluids and rest.  Preterm labor symptoms and general obstetric precautions including but not limited to vaginal bleeding, contractions, leaking of fluid and fetal movement were reviewed in detail with the patient.  Return in about 2 weeks (around 02/24/2017) for ROB.  Marcelyn BruinsJacelyn Genesys Coggeshall, CNM 02/10/2017  11:15 AM

## 2017-02-10 NOTE — Progress Notes (Signed)
C/O getting over bronchitis, is feeling much better.rj

## 2017-02-24 ENCOUNTER — Ambulatory Visit (INDEPENDENT_AMBULATORY_CARE_PROVIDER_SITE_OTHER): Payer: Medicaid Other | Admitting: Obstetrics and Gynecology

## 2017-02-24 VITALS — BP 102/58 | Wt 174.0 lb

## 2017-02-24 DIAGNOSIS — J45909 Unspecified asthma, uncomplicated: Secondary | ICD-10-CM

## 2017-02-24 DIAGNOSIS — O99013 Anemia complicating pregnancy, third trimester: Secondary | ICD-10-CM

## 2017-02-24 DIAGNOSIS — F418 Other specified anxiety disorders: Secondary | ICD-10-CM

## 2017-02-24 DIAGNOSIS — Z3480 Encounter for supervision of other normal pregnancy, unspecified trimester: Secondary | ICD-10-CM

## 2017-02-24 DIAGNOSIS — O09293 Supervision of pregnancy with other poor reproductive or obstetric history, third trimester: Secondary | ICD-10-CM

## 2017-02-24 DIAGNOSIS — O99513 Diseases of the respiratory system complicating pregnancy, third trimester: Secondary | ICD-10-CM

## 2017-02-24 NOTE — Progress Notes (Signed)
Routine Prenatal Care Visit  Subjective  Allison Black is a 21 y.o. G3P1011 at 161w5d being seen today for ongoing prenatal care.  She is currently monitored for the following issues for this low-risk pregnancy and has Supervision of other normal pregnancy, antepartum; Depression with anxiety; Episodic lightheadedness; Anemia affecting pregnancy in third trimester; Need for immunization against influenza; and Asthma affecting pregnancy in third trimester on their problem list.  ----------------------------------------------------------------------------------- Patient reports arm pain and numbness.   Contractions: Not present. Vag. Bleeding: None.  Movement: Present. Denies leaking of fluid.  ----------------------------------------------------------------------------------- The following portions of the patient's history were reviewed and updated as appropriate: allergies, current medications, past family history, past medical history, past social history, past surgical history and problem list. Problem list updated.   Objective  Blood pressure (!) 102/58, weight 174 lb (78.9 kg), last menstrual period 06/19/2016. Pregravid weight 130 lb (59 kg) Total Weight Gain 44 lb (20 kg) Urinalysis: Urine Protein: 1+ Urine Glucose: Negative  Fetal Status: Fetal Heart Rate (bpm): 140 Fundal Height: 45 cm Movement: Present     General:  Alert, oriented and cooperative. Patient is in no acute distress.  Skin: Skin is warm and dry. No rash noted.   Cardiovascular: Normal heart rate noted  Respiratory: Normal respiratory effort, no problems with respiration noted  Abdomen: Soft, gravid, appropriate for gestational age. Pain/Pressure: Absent     Pelvic:  Cervical exam deferred        Extremities: Normal range of motion.  Edema: Trace  ental Status: Normal mood and affect. Normal behavior. Normal judgment and thought content.     Assessment   21 y.o. G3P1011 at 4161w5d by  03/26/2017, by Last  Menstrual Period presenting for routine prenatal visit  Plan   pregnancy Problems (from 08/11/16 to present)    Problem Noted Resolved   Asthma affecting pregnancy in third trimester 02/10/2017 by Oswaldo ConroySchmid, Jacelyn Y, CNM No   Anemia affecting pregnancy in third trimester 01/12/2017 by Conard NovakJackson, Stephen D, MD No   Overview Signed 01/12/2017 11:43 AM by Conard NovakJackson, Stephen D, MD    [ ]  start iron sulfate BID      Depression with anxiety 10/09/2016 by Conard NovakJackson, Stephen D, MD No   Supervision of other normal pregnancy, antepartum 08/21/2016 by Vena AustriaStaebler, Jaquez Farrington, MD No   Overview Addendum 01/27/2017  5:40 PM by Vena AustriaStaebler, Shajuan Musso, MD    [facility] Prenatal Labs  Dating LMP = 9 week US Blood type: B/Positive/-- (05/07 1117)   Genetic Screen 1 Screen:negative      Antibody:Negative (05/07 1117)  Anatomic US Complete normal female Rubella: 1.64 (05/07 1117) Varicella: Immune  GTT Third 139 trimester:  RPR: Non Reactive (05/07 1117)   Rhogam N/A HBsAg: Negative (05/07 1117)   TDaP vaccine 01/27/17 Flu Shot: 01/27/17 HIV: Non Reactive (05/07 1117)   Baby Food                                GBS:   Contraception  Pap:  CBB     CS/VBAC    Support Person         9/14-mild anemia-Fusion Plus called in          Term labor symptoms and general obstetric precautions including but not limited to vaginal bleeding, contractions, leaking of fluid and fetal movement were reviewed in detail with the patient. Please refer to After Visit Summary for other counseling recommendations.  - growth  scan in 1 week given history of macrosomia - discussed likely MSK etiology vs anxiety for patient symptoms.  Has history of anxiety and panic attacks, no cardiac history.  If chest pain or nausea develop advised ED although low overall concern for cardiac etiology  - GBS and aptima next visit  Return in about 1 week (around 03/03/2017) for ROB and growth scan.

## 2017-02-24 NOTE — Progress Notes (Signed)
ROB Sharpe pain in upper chest left side, radiates down arm

## 2017-02-25 ENCOUNTER — Encounter: Payer: Self-pay | Admitting: Obstetrics and Gynecology

## 2017-03-05 ENCOUNTER — Other Ambulatory Visit: Payer: Medicaid Other

## 2017-03-05 ENCOUNTER — Encounter: Payer: Medicaid Other | Admitting: Advanced Practice Midwife

## 2017-03-06 ENCOUNTER — Encounter: Payer: Medicaid Other | Admitting: Advanced Practice Midwife

## 2017-03-06 ENCOUNTER — Encounter: Payer: Self-pay | Admitting: Advanced Practice Midwife

## 2017-03-06 ENCOUNTER — Ambulatory Visit (INDEPENDENT_AMBULATORY_CARE_PROVIDER_SITE_OTHER): Payer: Medicaid Other

## 2017-03-06 ENCOUNTER — Ambulatory Visit (INDEPENDENT_AMBULATORY_CARE_PROVIDER_SITE_OTHER): Payer: Medicaid Other | Admitting: Advanced Practice Midwife

## 2017-03-06 VITALS — BP 100/50 | Wt 179.0 lb

## 2017-03-06 DIAGNOSIS — J45909 Unspecified asthma, uncomplicated: Secondary | ICD-10-CM | POA: Diagnosis not present

## 2017-03-06 DIAGNOSIS — Z3685 Encounter for antenatal screening for Streptococcus B: Secondary | ICD-10-CM

## 2017-03-06 DIAGNOSIS — Z3A37 37 weeks gestation of pregnancy: Secondary | ICD-10-CM

## 2017-03-06 DIAGNOSIS — O09293 Supervision of pregnancy with other poor reproductive or obstetric history, third trimester: Secondary | ICD-10-CM | POA: Diagnosis not present

## 2017-03-06 DIAGNOSIS — Z113 Encounter for screening for infections with a predominantly sexual mode of transmission: Secondary | ICD-10-CM

## 2017-03-06 DIAGNOSIS — O99513 Diseases of the respiratory system complicating pregnancy, third trimester: Secondary | ICD-10-CM

## 2017-03-06 DIAGNOSIS — Z3480 Encounter for supervision of other normal pregnancy, unspecified trimester: Secondary | ICD-10-CM | POA: Diagnosis not present

## 2017-03-06 NOTE — Progress Notes (Signed)
No concerns. 

## 2017-03-06 NOTE — Progress Notes (Signed)
Routine Prenatal Care Visit  Subjective  Allison Black is a 21 y.o. G3P1011 at 7976w1d being seen today for ongoing prenatal care.  She is currently monitored for the following issues for this low-risk pregnancy and has Supervision of other normal pregnancy, antepartum; Depression with anxiety; Episodic lightheadedness; Anemia affecting pregnancy in third trimester; Need for immunization against influenza; and Asthma affecting pregnancy in third trimester on their problem list.  ----------------------------------------------------------------------------------- Patient reports no complaints.   Denies vaginal bleeding. Denies leaking of fluid. Admits occasional contractions. ----------------------------------------------------------------------------------- The following portions of the patient's history were reviewed and updated as appropriate: allergies, current medications, past family history, past medical history, past social history, past surgical history and problem list. Problem list updated.   Objective  Blood pressure (!) 100/50, weight 179 lb (81.2 kg), last menstrual period 06/19/2016. Pregravid weight 130 lb (59 kg) Total Weight Gain 49 lb (22.2 kg) Urinalysis: Urine Protein: Negative Urine Glucose: Negative  Fetal Status: Positive fetal movement Growth scan today: 7# 14oz, 83%, AC >97%, AFI 19.59  General:  Alert, oriented and cooperative. Patient is in no acute distress.  Skin: Skin is warm and dry. No rash noted.   Cardiovascular: Normal heart rate noted  Respiratory: Normal respiratory effort, no problems with respiration noted  Abdomen: Soft, gravid, appropriate for gestational age.       Pelvic:  Cervical exam performed GBS/aptima today. Cervix visually 1-2 cm external os  Extremities: Normal range of motion.     Mental Status: Normal mood and affect. Normal behavior. Normal judgment and thought content.   Assessment   21 y.o. G3P1011 at 4576w1d by  03/26/2017, by Last  Menstrual Period presenting for routine prenatal visit  Plan   pregnancy Problems (from 08/11/16 to present)    Problem Noted Resolved   Asthma affecting pregnancy in third trimester 02/10/2017 by Oswaldo ConroySchmid, Jacelyn Y, CNM No   Anemia affecting pregnancy in third trimester 01/12/2017 by Conard NovakJackson, Stephen D, MD No   Overview Signed 01/12/2017 11:43 AM by Conard NovakJackson, Stephen D, MD    [ ]  start iron sulfate BID      Depression with anxiety 10/09/2016 by Conard NovakJackson, Stephen D, MD No   Supervision of other normal pregnancy, antepartum 08/21/2016 by Vena AustriaStaebler, Andreas, MD No   Overview Addendum 01/27/2017  5:40 PM by Vena AustriaStaebler, Andreas, MD    [facility] Prenatal Labs  Dating LMP = 9 week US Blood type: B/Positive/-- (05/07 1117)   Genetic Screen 1 Screen:negative      Antibody:Negative (05/07 1117)  Anatomic US Complete normal female Rubella: 1.64 (05/07 1117) Varicella: Immune  GTT Third 139 trimester:  RPR: Non Reactive (05/07 1117)   Rhogam N/A HBsAg: Negative (05/07 1117)   TDaP vaccine 01/27/17 Flu Shot: 01/27/17 HIV: Non Reactive (05/07 1117)   Baby Food                                GBS:   Contraception  Pap:  CBB     CS/VBAC    Support Person         9/14-mild anemia-Fusion Plus called in  [ ]  Schedule 40 wk IOL for hx macrosomia/current fetus LGA          Term labor symptoms and general obstetric precautions including but not limited to vaginal bleeding, contractions, leaking of fluid and fetal movement were reviewed in detail with the patient. Please refer to After Visit Summary for other counseling recommendations.  Return in about 1 week (around 03/13/2017) for rob.  Tresea MallJane Tayen Black, CNM  03/06/2017 4:56 PM

## 2017-03-06 NOTE — Patient Instructions (Signed)
Vaginal Delivery Vaginal delivery means that you will give birth by pushing your baby out of your birth canal (vagina). A team of health care providers will help you before, during, and after vaginal delivery. Birth experiences are unique for every woman and every pregnancy, and birth experiences vary depending on where you choose to give birth. What should I do to prepare for my baby's birth? Before your baby is born, it is important to talk with your health care provider about:  Your labor and delivery preferences. These may include: ? Medicines that you may be given. ? How you will manage your pain. This might include non-medical pain relief techniques or injectable pain relief such as epidural analgesia. ? How you and your baby will be monitored during labor and delivery. ? Who may be in the labor and delivery room with you. ? Your feelings about surgical delivery of your baby (cesarean delivery, or C-section) if this becomes necessary. ? Your feelings about receiving donated blood through an IV tube (blood transfusion) if this becomes necessary.  Whether you are able: ? To take pictures or videos of the birth. ? To eat during labor and delivery. ? To move around, walk, or change positions during labor and delivery.  What to expect after your baby is born, such as: ? Whether delayed umbilical cord clamping and cutting is offered. ? Who will care for your baby right after birth. ? Medicines or tests that may be recommended for your baby. ? Whether breastfeeding is supported in your hospital or birth center. ? How long you will be in the hospital or birth center.  How any medical conditions you have may affect your baby or your labor and delivery experience.  To prepare for your baby's birth, you should also:  Attend all of your health care visits before delivery (prenatal visits) as recommended by your health care provider. This is important.  Prepare your home for your baby's  arrival. Make sure that you have: ? Diapers. ? Baby clothing. ? Feeding equipment. ? Safe sleeping arrangements for you and your baby.  Install a car seat in your vehicle. Have your car seat checked by a certified car seat installer to make sure that it is installed safely.  Think about who will help you with your new baby at home for at least the first several weeks after delivery.  What can I expect when I arrive at the birth center or hospital? Once you are in labor and have been admitted into the hospital or birth center, your health care provider may:  Review your pregnancy history and any concerns you have.  Insert an IV tube into one of your veins. This is used to give you fluids and medicines.  Check your blood pressure, pulse, temperature, and heart rate (vital signs).  Check whether your bag of water (amniotic sac) has broken (ruptured).  Talk with you about your birth plan and discuss pain control options.  Monitoring Your health care provider may monitor your contractions (uterine monitoring) and your baby's heart rate (fetal monitoring). You may need to be monitored:  Often, but not continuously (intermittently).  All the time or for long periods at a time (continuously). Continuous monitoring may be needed if: ? You are taking certain medicines, such as medicine to relieve pain or make your contractions stronger. ? You have pregnancy or labor complications.  Monitoring may be done by:  Placing a special stethoscope or a handheld monitoring device on your abdomen to   check your baby's heartbeat, and feeling your abdomen for contractions. This method of monitoring does not continuously record your baby's heartbeat or your contractions.  Placing monitors on your abdomen (external monitors) to record your baby's heartbeat and the frequency and length of contractions. You may not have to wear external monitors all the time.  Placing monitors inside of your uterus  (internal monitors) to record your baby's heartbeat and the frequency, length, and strength of your contractions. ? Your health care provider may use internal monitors if he or she needs more information about the strength of your contractions or your baby's heart rate. ? Internal monitors are put in place by passing a thin, flexible wire through your vagina and into your uterus. Depending on the type of monitor, it may remain in your uterus or on your baby's head until birth. ? Your health care provider will discuss the benefits and risks of internal monitoring with you and will ask for your permission before inserting the monitors.  Telemetry. This is a type of continuous monitoring that can be done with external or internal monitors. Instead of having to stay in bed, you are able to move around during telemetry. Ask your health care provider if telemetry is an option for you.  Physical exam Your health care provider may perform a physical exam. This may include:  Checking whether your baby is positioned: ? With the head toward your vagina (head-down). This is most common. ? With the head toward the top of your uterus (head-up or breech). If your baby is in a breech position, your health care provider may try to turn your baby to a head-down position so you can deliver vaginally. If it does not seem that your baby can be born vaginally, your provider may recommend surgery to deliver your baby. In rare cases, you may be able to deliver vaginally if your baby is head-up (breech delivery). ? Lying sideways (transverse). Babies that are lying sideways cannot be delivered vaginally.  Checking your cervix to determine: ? Whether it is thinning out (effacing). ? Whether it is opening up (dilating). ? How low your baby has moved into your birth canal.  What are the three stages of labor and delivery?  Normal labor and delivery is divided into the following three stages: Stage 1  Stage 1 is the  longest stage of labor, and it can last for hours or days. Stage 1 includes: ? Early labor. This is when contractions may be irregular, or regular and mild. Generally, early labor contractions are more than 10 minutes apart. ? Active labor. This is when contractions get longer, more regular, more frequent, and more intense. ? The transition phase. This is when contractions happen very close together, are very intense, and may last longer than during any other part of labor.  Contractions generally feel mild, infrequent, and irregular at first. They get stronger, more frequent (about every 2-3 minutes), and more regular as you progress from early labor through active labor and transition.  Many women progress through stage 1 naturally, but you may need help to continue making progress. If this happens, your health care provider may talk with you about: ? Rupturing your amniotic sac if it has not ruptured yet. ? Giving you medicine to help make your contractions stronger and more frequent.  Stage 1 ends when your cervix is completely dilated to 4 inches (10 cm) and completely effaced. This happens at the end of the transition phase. Stage 2  Once   your cervix is completely effaced and dilated to 4 inches (10 cm), you may start to feel an urge to push. It is common for the body to naturally take a rest before feeling the urge to push, especially if you received an epidural or certain other pain medicines. This rest period may last for up to 1-2 hours, depending on your unique labor experience.  During stage 2, contractions are generally less painful, because pushing helps relieve contraction pain. Instead of contraction pain, you may feel stretching and burning pain, especially when the widest part of your baby's head passes through the vaginal opening (crowning).  Your health care provider will closely monitor your pushing progress and your baby's progress through the vagina during stage 2.  Your  health care provider may massage the area of skin between your vaginal opening and anus (perineum) or apply warm compresses to your perineum. This helps it stretch as the baby's head starts to crown, which can help prevent perineal tearing. ? In some cases, an incision may be made in your perineum (episiotomy) to allow the baby to pass through the vaginal opening. An episiotomy helps to make the opening of the vagina larger to allow more room for the baby to fit through.  It is very important to breathe and focus so your health care provider can control the delivery of your baby's head. Your health care provider may have you decrease the intensity of your pushing, to help prevent perineal tearing.  After delivery of your baby's head, the shoulders and the rest of the body generally deliver very quickly and without difficulty.  Once your baby is delivered, the umbilical cord may be cut right away, or this may be delayed for 1-2 minutes, depending on your baby's health. This may vary among health care providers, hospitals, and birth centers.  If you and your baby are healthy enough, your baby may be placed on your chest or abdomen to help maintain the baby's temperature and to help you bond with each other. Some mothers and babies start breastfeeding at this time. Your health care team will dry your baby and help keep your baby warm during this time.  Your baby may need immediate care if he or she: ? Showed signs of distress during labor. ? Has a medical condition. ? Was born too early (prematurely). ? Had a bowel movement before birth (meconium). ? Shows signs of difficulty transitioning from being inside the uterus to being outside of the uterus. If you are planning to breastfeed, your health care team will help you begin a feeding. Stage 3  The third stage of labor starts immediately after the birth of your baby and ends after you deliver the placenta. The placenta is an organ that develops  during pregnancy to provide oxygen and nutrients to your baby in the womb.  Delivering the placenta may require some pushing, and you may have mild contractions. Breastfeeding can stimulate contractions to help you deliver the placenta.  After the placenta is delivered, your uterus should tighten (contract) and become firm. This helps to stop bleeding in your uterus. To help your uterus contract and to control bleeding, your health care provider may: ? Give you medicine by injection, through an IV tube, by mouth, or through your rectum (rectally). ? Massage your abdomen or perform a vaginal exam to remove any blood clots that are left in your uterus. ? Empty your bladder by placing a thin, flexible tube (catheter) into your bladder. ? Encourage   you to breastfeed your baby. After labor is over, you and your baby will be monitored closely to ensure that you are both healthy until you are ready to go home. Your health care team will teach you how to care for yourself and your baby. This information is not intended to replace advice given to you by your health care provider. Make sure you discuss any questions you have with your health care provider. Document Released: 01/01/2008 Document Revised: 10/12/2015 Document Reviewed: 04/08/2015 Elsevier Interactive Patient Education  2018 [employer]  

## 2017-03-07 LAB — GC/CHLAMYDIA PROBE AMP
Chlamydia trachomatis, NAA: NEGATIVE
Neisseria gonorrhoeae by PCR: NEGATIVE

## 2017-03-08 LAB — STREP GP B NAA: STREP GROUP B AG: POSITIVE — AB

## 2017-03-10 ENCOUNTER — Encounter: Payer: Medicaid Other | Admitting: Obstetrics and Gynecology

## 2017-03-10 ENCOUNTER — Encounter: Payer: Self-pay | Admitting: Advanced Practice Midwife

## 2017-03-12 ENCOUNTER — Encounter: Payer: Self-pay | Admitting: Advanced Practice Midwife

## 2017-03-13 ENCOUNTER — Encounter: Payer: Medicaid Other | Admitting: Obstetrics and Gynecology

## 2017-03-13 ENCOUNTER — Encounter: Payer: Self-pay | Admitting: Advanced Practice Midwife

## 2017-03-13 ENCOUNTER — Other Ambulatory Visit: Payer: Medicaid Other

## 2017-03-16 ENCOUNTER — Encounter: Payer: Medicaid Other | Admitting: Obstetrics and Gynecology

## 2017-03-18 ENCOUNTER — Ambulatory Visit (INDEPENDENT_AMBULATORY_CARE_PROVIDER_SITE_OTHER): Payer: Medicaid Other | Admitting: Advanced Practice Midwife

## 2017-03-18 ENCOUNTER — Encounter: Payer: Self-pay | Admitting: Advanced Practice Midwife

## 2017-03-18 VITALS — BP 110/60 | Wt 180.0 lb

## 2017-03-18 DIAGNOSIS — M545 Low back pain, unspecified: Secondary | ICD-10-CM

## 2017-03-18 DIAGNOSIS — Z3A38 38 weeks gestation of pregnancy: Secondary | ICD-10-CM

## 2017-03-18 LAB — POCT URINALYSIS DIPSTICK
BILIRUBIN UA: NEGATIVE
Blood, UA: NEGATIVE
GLUCOSE UA: NEGATIVE
KETONES UA: NEGATIVE
Protein, UA: NEGATIVE
SPEC GRAV UA: 1.015 (ref 1.010–1.025)
Urobilinogen, UA: NEGATIVE E.U./dL — AB
pH, UA: 6.5 (ref 5.0–8.0)

## 2017-03-18 NOTE — Progress Notes (Signed)
Routine Prenatal Care Visit  Subjective  Allison Black is a 21 y.o. G3P1011 at 7270w6d being seen today for ongoing prenatal care.  She is currently monitored for the following issues for this low-risk pregnancy and has Supervision of other normal pregnancy, antepartum; Depression with anxiety; Episodic lightheadedness; Anemia affecting pregnancy in third trimester; Need for immunization against influenza; and Asthma affecting pregnancy in third trimester on their problem list.  ----------------------------------------------------------------------------------- Patient reports no complaints.   Contractions: Irregular. Vag. Bleeding: None.  Movement: Present. Denies leaking of fluid.  ----------------------------------------------------------------------------------- The following portions of the patient's history were reviewed and updated as appropriate: allergies, current medications, past family history, past medical history, past social history, past surgical history and problem list. Problem list updated.   Objective  Blood pressure 110/60, weight 180 lb (81.6 kg), last menstrual period 06/19/2016. Pregravid weight 130 lb (59 kg) Total Weight Gain 50 lb (22.7 kg) Urinalysis: Urine Protein: Negative Urine Glucose: Negative  Fetal Status: Fetal Heart Rate (bpm): 151 Fundal Height: 40 cm Movement: Present  Presentation: Vertex  General:  Alert, oriented and cooperative. Patient is in no acute distress.  Skin: Skin is warm and dry. No rash noted.   Cardiovascular: Normal heart rate noted  Respiratory: Normal respiratory effort, no problems with respiration noted  Abdomen: Soft, gravid, appropriate for gestational age. Pain/Pressure: Absent     Pelvic:  Cervical exam performed Cervix is posterior and difficult to reach- unable to assess dilation, fetal head is low in pelvis  Extremities: Normal range of motion.     Mental Status: Normal mood and affect. Normal behavior. Normal judgment and  thought content.   Assessment   21 y.o. G3P1011 at 2070w6d by  03/26/2017, by Last Menstrual Period presenting for routine prenatal visit  Plan   pregnancy Problems (from 08/11/16 to present)    Problem Noted Resolved   Asthma affecting pregnancy in third trimester 02/10/2017 by Oswaldo ConroySchmid, Jacelyn Y, CNM No   Anemia affecting pregnancy in third trimester 01/12/2017 by Conard NovakJackson, Stephen D, MD No   Overview Signed 01/12/2017 11:43 AM by Conard NovakJackson, Stephen D, MD    [ ]  start iron sulfate BID      Depression with anxiety 10/09/2016 by Conard NovakJackson, Stephen D, MD No   Supervision of other normal pregnancy, antepartum 08/21/2016 by Vena AustriaStaebler, Andreas, MD No   Overview Addendum 01/27/2017  5:40 PM by Vena AustriaStaebler, Andreas, MD    Clinic [facility] Prenatal Labs  Dating LMP = 9 week US Blood type: B/Positive/-- (05/07 1117)   Genetic Screen 1 Screen:negative      Antibody:Negative (05/07 1117)  Anatomic US Complete normal female Rubella: 1.64 (05/07 1117) Varicella: Immune  GTT Third 139 trimester:  RPR: Non Reactive (05/07 1117)   Rhogam N/A HBsAg: Negative (05/07 1117)   TDaP vaccine 01/27/17 Flu Shot: 01/27/17 HIV: Non Reactive (05/07 1117)   Baby Food                                GBS: Positive 11/30  Contraception  Pap:  CBB     CS/VBAC NA   Support Person         9/14-mild anemia-F[facility] called in          Term labor symptoms and general obstetric precautions including but not limited to vaginal bleeding, contractions, leaking of fluid and fetal movement were reviewed in detail with the patient. Please refer to After Visit Summary for other counseling  recommendations.   Return for IOL is 12/20.  Tresea MallJane Terral Cooks, CNM  03/18/2017 11:28 AM

## 2017-03-18 NOTE — Progress Notes (Signed)
C/o some contractions, cramping, lower back pain, swelling.rj

## 2017-03-26 ENCOUNTER — Other Ambulatory Visit: Payer: Self-pay | Admitting: Obstetrics and Gynecology

## 2017-03-26 NOTE — Progress Notes (Signed)
Discussed with patient that there was not a room for her induction. Offered patient to either come sit and wait for a room at midnight or to come at 5am for an induction. Patient is upset because her induction has been delayed several times, but labor and delivery is currently full with patients. She will plan to come for her induction at 5am.

## 2017-03-27 ENCOUNTER — Inpatient Hospital Stay: Payer: Medicaid Other | Admitting: Anesthesiology

## 2017-03-27 ENCOUNTER — Inpatient Hospital Stay
Admission: EM | Admit: 2017-03-27 | Discharge: 2017-03-29 | DRG: 807 | Disposition: A | Discharge status: Home / Self Care | Payer: Medicaid Other | Source: Ambulatory Visit | Attending: Obstetrics & Gynecology | Admitting: Obstetrics & Gynecology

## 2017-03-27 ENCOUNTER — Encounter: Payer: Self-pay | Admitting: *Deleted

## 2017-03-27 ENCOUNTER — Other Ambulatory Visit: Payer: Self-pay

## 2017-03-27 DIAGNOSIS — O9952 Diseases of the respiratory system complicating childbirth: Secondary | ICD-10-CM | POA: Diagnosis present

## 2017-03-27 DIAGNOSIS — O99013 Anemia complicating pregnancy, third trimester: Secondary | ICD-10-CM

## 2017-03-27 DIAGNOSIS — J45909 Unspecified asthma, uncomplicated: Secondary | ICD-10-CM | POA: Diagnosis present

## 2017-03-27 DIAGNOSIS — O99824 Streptococcus B carrier state complicating childbirth: Secondary | ICD-10-CM | POA: Diagnosis present

## 2017-03-27 DIAGNOSIS — Z3A40 40 weeks gestation of pregnancy: Secondary | ICD-10-CM

## 2017-03-27 DIAGNOSIS — Z87891 Personal history of nicotine dependence: Secondary | ICD-10-CM

## 2017-03-27 DIAGNOSIS — F418 Other specified anxiety disorders: Secondary | ICD-10-CM

## 2017-03-27 DIAGNOSIS — D649 Anemia, unspecified: Secondary | ICD-10-CM | POA: Diagnosis present

## 2017-03-27 DIAGNOSIS — O3660X0 Maternal care for excessive fetal growth, unspecified trimester, not applicable or unspecified: Secondary | ICD-10-CM | POA: Diagnosis present

## 2017-03-27 DIAGNOSIS — Z3480 Encounter for supervision of other normal pregnancy, unspecified trimester: Secondary | ICD-10-CM

## 2017-03-27 DIAGNOSIS — IMO0002 Reserved for concepts with insufficient information to code with codable children: Secondary | ICD-10-CM | POA: Diagnosis present

## 2017-03-27 DIAGNOSIS — O3663X0 Maternal care for excessive fetal growth, third trimester, not applicable or unspecified: Principal | ICD-10-CM | POA: Diagnosis present

## 2017-03-27 DIAGNOSIS — O9902 Anemia complicating childbirth: Secondary | ICD-10-CM | POA: Diagnosis present

## 2017-03-27 DIAGNOSIS — O99513 Diseases of the respiratory system complicating pregnancy, third trimester: Secondary | ICD-10-CM

## 2017-03-27 LAB — CBC
HCT: 30.5 % — ABNORMAL LOW (ref 35.0–47.0)
Hemoglobin: 9.7 g/dL — ABNORMAL LOW (ref 12.0–16.0)
MCH: 25.6 pg — ABNORMAL LOW (ref 26.0–34.0)
MCHC: 31.9 g/dL — ABNORMAL LOW (ref 32.0–36.0)
MCV: 80.1 fL (ref 80.0–100.0)
PLATELETS: 199 10*3/uL (ref 150–440)
RBC: 3.81 MIL/uL (ref 3.80–5.20)
RDW: 15.4 % — AB (ref 11.5–14.5)
WBC: 7.7 10*3/uL (ref 3.6–11.0)

## 2017-03-27 LAB — TYPE AND SCREEN
ABO/RH(D): B POS
Antibody Screen: NEGATIVE

## 2017-03-27 LAB — RAPID HIV SCREEN (HIV 1/2 AB+AG)
HIV 1/2 Antibodies: NONREACTIVE
HIV-1 P24 ANTIGEN - HIV24: NONREACTIVE

## 2017-03-27 MED ORDER — OXYTOCIN BOLUS FROM INFUSION
500.0000 mL | Freq: Once | INTRAVENOUS | Status: AC
  Administered 2017-03-27: 500 mL via INTRAVENOUS

## 2017-03-27 MED ORDER — ACETAMINOPHEN 325 MG PO TABS
650.0000 mg | ORAL_TABLET | ORAL | Status: DC | PRN
Start: 2017-03-27 — End: 2017-03-29
  Administered 2017-03-28 – 2017-03-29 (×2): 650 mg via ORAL
  Filled 2017-03-27 (×2): qty 2

## 2017-03-27 MED ORDER — FENTANYL 2.5 MCG/ML W/ROPIVACAINE 0.15% IN NS 100 ML EPIDURAL (ARMC)
EPIDURAL | Status: AC
  Filled 2017-03-27: qty 100

## 2017-03-27 MED ORDER — TERBUTALINE SULFATE 1 MG/ML IJ SOLN: 0.2500 mg | Freq: Once | INTRAMUSCULAR | Status: DC | PRN

## 2017-03-27 MED ORDER — OXYCODONE-ACETAMINOPHEN 5-325 MG PO TABS: 1.0000 | ORAL_TABLET | ORAL | Status: DC | PRN

## 2017-03-27 MED ORDER — DIBUCAINE 1 % RE OINT
1.0000 "application " | TOPICAL_OINTMENT | RECTAL | Status: DC | PRN
  Filled 2017-03-27: qty 28

## 2017-03-27 MED ORDER — IBUPROFEN 600 MG PO TABS
600.0000 mg | ORAL_TABLET | Freq: Four times a day (QID) | ORAL | Status: DC
  Administered 2017-03-28 – 2017-03-29 (×6): 600 mg via ORAL
  Filled 2017-03-27 (×6): qty 1

## 2017-03-27 MED ORDER — ONDANSETRON HCL 4 MG/2ML IJ SOLN: 4.0000 mg | Freq: Four times a day (QID) | INTRAMUSCULAR | Status: DC | PRN

## 2017-03-27 MED ORDER — SENNOSIDES-DOCUSATE SODIUM 8.6-50 MG PO TABS
2.0000 | ORAL_TABLET | ORAL | Status: DC
  Administered 2017-03-28: 2 via ORAL
  Filled 2017-03-27: qty 2

## 2017-03-27 MED ORDER — LIDOCAINE HCL (PF) 1 % IJ SOLN
INTRAMUSCULAR | Status: AC
  Filled 2017-03-27: qty 30

## 2017-03-27 MED ORDER — MISOPROSTOL 25 MCG QUARTER TABLET
25.0000 ug | ORAL_TABLET | ORAL | Status: DC | PRN
  Administered 2017-03-27: 25 ug via VAGINAL
  Filled 2017-03-27: qty 1

## 2017-03-27 MED ORDER — LIDOCAINE-EPINEPHRINE (PF) 1.5 %-1:200000 IJ SOLN
INTRAMUSCULAR | Status: DC | PRN
  Administered 2017-03-27: 3 mL via EPIDURAL

## 2017-03-27 MED ORDER — PENICILLIN G POTASSIUM 5000000 UNITS IJ SOLR
5.0000 10*6.[IU] | Freq: Once | INTRAVENOUS | Status: AC
  Administered 2017-03-27: 5 10*6.[IU] via INTRAVENOUS
  Filled 2017-03-27: qty 5

## 2017-03-27 MED ORDER — FENTANYL 2.5 MCG/ML W/ROPIVACAINE 0.15% IN NS 100 ML EPIDURAL (ARMC)
12.0000 mL/h | EPIDURAL | Status: DC
  Administered 2017-03-27: 12 mL/h via EPIDURAL

## 2017-03-27 MED ORDER — BENZOCAINE-MENTHOL 20-0.5 % EX AERO
1.0000 "application " | INHALATION_SPRAY | CUTANEOUS | Status: DC | PRN
  Administered 2017-03-28: 1 via TOPICAL
  Filled 2017-03-27: qty 56

## 2017-03-27 MED ORDER — PHENYLEPHRINE 40 MCG/ML (10ML) SYRINGE FOR IV PUSH (FOR BLOOD PRESSURE SUPPORT)
80.0000 ug | PREFILLED_SYRINGE | INTRAVENOUS | Status: DC | PRN
  Filled 2017-03-27: qty 5

## 2017-03-27 MED ORDER — LACTATED RINGERS IV SOLN: 500.0000 mL | INTRAVENOUS | Status: DC | PRN

## 2017-03-27 MED ORDER — MISOPROSTOL 200 MCG PO TABS
ORAL_TABLET | ORAL | Status: AC
  Administered 2017-03-27: 25 ug via VAGINAL
  Filled 2017-03-27: qty 4

## 2017-03-27 MED ORDER — SIMETHICONE 80 MG PO CHEW: 80.0000 mg | CHEWABLE_TABLET | ORAL | Status: DC | PRN

## 2017-03-27 MED ORDER — DIPHENHYDRAMINE HCL 50 MG/ML IJ SOLN: 12.5000 mg | INTRAMUSCULAR | Status: DC | PRN

## 2017-03-27 MED ORDER — TETANUS-DIPHTH-ACELL PERTUSSIS 5-2.5-18.5 LF-MCG/0.5 IM SUSP
0.5000 mL | Freq: Once | INTRAMUSCULAR | Status: DC
  Filled 2017-03-27: qty 0.5

## 2017-03-27 MED ORDER — ONDANSETRON HCL 4 MG/2ML IJ SOLN: 4.0000 mg | INTRAMUSCULAR | Status: DC | PRN

## 2017-03-27 MED ORDER — AMMONIA AROMATIC IN INHA
RESPIRATORY_TRACT | Status: AC
  Filled 2017-03-27: qty 10

## 2017-03-27 MED ORDER — BUDESONIDE 0.5 MG/2ML IN SUSP
0.5000 mg | Freq: Two times a day (BID) | RESPIRATORY_TRACT | Status: DC
  Filled 2017-03-27 (×4): qty 2

## 2017-03-27 MED ORDER — SODIUM CHLORIDE 0.9 % IV SOLN: 250.0000 mL | INTRAVENOUS | Status: DC | PRN

## 2017-03-27 MED ORDER — ZOLPIDEM TARTRATE 5 MG PO TABS: 5.0000 mg | ORAL_TABLET | Freq: Every evening | ORAL | Status: DC | PRN

## 2017-03-27 MED ORDER — OXYTOCIN 40 UNITS IN LACTATED RINGERS INFUSION - SIMPLE MED: 1.0000 m[IU]/min | INTRAVENOUS | Status: DC

## 2017-03-27 MED ORDER — LIDOCAINE HCL (PF) 1 % IJ SOLN: 30.0000 mL | INTRAMUSCULAR | Status: DC | PRN

## 2017-03-27 MED ORDER — WITCH HAZEL-GLYCERIN EX PADS
1.0000 "application " | MEDICATED_PAD | CUTANEOUS | Status: DC | PRN
  Filled 2017-03-27: qty 100

## 2017-03-27 MED ORDER — SODIUM CHLORIDE 0.9 % IV SOLN
INTRAVENOUS | Status: DC | PRN
  Administered 2017-03-27 (×2): 5 mL via EPIDURAL

## 2017-03-27 MED ORDER — COCONUT OIL OIL: 1.0000 "application " | TOPICAL_OIL | Status: DC | PRN

## 2017-03-27 MED ORDER — OXYCODONE-ACETAMINOPHEN 5-325 MG PO TABS: 2.0000 | ORAL_TABLET | ORAL | Status: DC | PRN

## 2017-03-27 MED ORDER — OXYTOCIN 40 UNITS IN LACTATED RINGERS INFUSION - SIMPLE MED
2.5000 [IU]/h | INTRAVENOUS | Status: DC
  Filled 2017-03-27: qty 1000

## 2017-03-27 MED ORDER — PENICILLIN G POT IN DEXTROSE 60000 UNIT/ML IV SOLN
3.0000 10*6.[IU] | INTRAVENOUS | Status: DC
  Administered 2017-03-27: 3 10*6.[IU] via INTRAVENOUS
  Filled 2017-03-27 (×7): qty 50

## 2017-03-27 MED ORDER — LACTATED RINGERS IV SOLN: INTRAVENOUS | Status: DC

## 2017-03-27 MED ORDER — ONDANSETRON HCL 4 MG PO TABS
4.0000 mg | ORAL_TABLET | ORAL | Status: DC | PRN
Start: 2017-03-27 — End: 2017-03-29

## 2017-03-27 MED ORDER — LACTATED RINGERS IV SOLN: 500.0000 mL | Freq: Once | INTRAVENOUS | Status: DC

## 2017-03-27 MED ORDER — ACETAMINOPHEN 325 MG PO TABS
650.0000 mg | ORAL_TABLET | ORAL | Status: DC | PRN
  Administered 2017-03-27: 650 mg via ORAL
  Filled 2017-03-27: qty 2

## 2017-03-27 MED ORDER — DIPHENHYDRAMINE HCL 25 MG PO CAPS: 25.0000 mg | ORAL_CAPSULE | Freq: Four times a day (QID) | ORAL | Status: DC | PRN

## 2017-03-27 MED ORDER — EPHEDRINE 5 MG/ML INJ
10.0000 mg | INTRAVENOUS | Status: DC | PRN
  Filled 2017-03-27: qty 2

## 2017-03-27 MED ORDER — SOD CITRATE-CITRIC ACID 500-334 MG/5ML PO SOLN: 30.0000 mL | ORAL | Status: DC | PRN

## 2017-03-27 MED ORDER — OXYTOCIN 10 UNIT/ML IJ SOLN
INTRAMUSCULAR | Status: AC
  Filled 2017-03-27: qty 2

## 2017-03-27 MED ORDER — SODIUM CHLORIDE 0.9% FLUSH: 3.0000 mL | Freq: Two times a day (BID) | INTRAVENOUS | Status: DC

## 2017-03-27 MED ORDER — SODIUM CHLORIDE 0.9% FLUSH: 3.0000 mL | INTRAVENOUS | Status: DC | PRN

## 2017-03-27 MED ORDER — LIDOCAINE HCL (PF) 1 % IJ SOLN
INTRAMUSCULAR | Status: DC | PRN
  Administered 2017-03-27: 1 mL via INTRADERMAL

## 2017-03-27 MED ORDER — ALBUTEROL SULFATE (2.5 MG/3ML) 0.083% IN NEBU: 2.5000 mg | INHALATION_SOLUTION | RESPIRATORY_TRACT | Status: DC | PRN

## 2017-03-27 NOTE — Progress Notes (Signed)
Patient ID: [name], female   DOB: [birth year], 21 y.o.   MRN: [id number] Placed first cytotec SVE: 1, thick, -3 Adelene Idlerhristanna Schuman M.D.  03/27/17 6:39 AM

## 2017-03-27 NOTE — OB Triage Note (Signed)
Recvd pt from ED for scheduled IOL. PT has no complaints and is feeling baby move well

## 2017-03-27 NOTE — Progress Notes (Signed)
Pt seen and plan of care discussed. Cytotec first dose in place; further doses vs Pitocin discussed and planned. ABX for GBS started to be therapeutic  Pain mgt discussed.  Annamarie MajorPaul Briena Swingler, MD, Merlinda FrederickFACOG [facility], [facility] 03/27/2017  8:33 AM

## 2017-03-27 NOTE — Anesthesia Preprocedure Evaluation (Signed)
Anesthesia Evaluation  Patient identified by MRN, date of birth, ID band Patient awake    Reviewed: Allergy & Precautions, H&P , NPO status , Patient's Chart, lab work & pertinent test results  History of Anesthesia Complications Negative for: history of anesthetic complications  Airway Mallampati: III  TM Distance: >3 FB Neck ROM: full    Dental  (+) Chipped   Pulmonary neg pulmonary ROS, neg shortness of breath, asthma , former smoker,           Cardiovascular Exercise Tolerance: Good (-) hypertension+ Valvular Problems/Murmurs      Neuro/Psych    GI/Hepatic negative GI ROS,   Endo/Other    Renal/GU   negative genitourinary   Musculoskeletal   Abdominal   Peds  Hematology  (+) Blood dyscrasia, anemia ,   Anesthesia Other Findings Past Medical History: No date: Asthma No date: Heart murmur  History reviewed. No pertinent surgical history.  BMI    Body Mass Index:  33.11 kg/m      Reproductive/Obstetrics (+) Pregnancy                             Anesthesia Physical Anesthesia Plan  ASA: III  Anesthesia Plan: Epidural   Post-op Pain Management:    Induction:   PONV Risk Score and Plan:   Airway Management Planned:   Additional Equipment:   Intra-op Plan:   Post-operative Plan:   Informed Consent: I have reviewed the patients History and Physical, chart, labs and discussed the procedure including the risks, benefits and alternatives for the proposed anesthesia with the patient or authorized representative who has indicated his/her understanding and acceptance.     Plan Discussed with: Anesthesiologist  Anesthesia Plan Comments:         Anesthesia Quick Evaluation

## 2017-03-27 NOTE — Progress Notes (Signed)
  Labor Progress Note   21 y.o. [id number] @ 5061w1d , admitted for  Pregnancy, Labor Management.   Subjective:  Some pressure.  No pain since epidural  Objective:  BP 114/72   Pulse 91   Temp 97.9 F (36.6 C) (Oral)   Resp 18   Ht 5\' 2"  (1.575 m)   Wt 181 lb (82.1 kg)   LMP 06/19/2016   BMI 33.11 kg/m  Abd: moderate Extr: trace to 1+ bilateral pedal edema SVE: CERVIX: 8cm dilated, 80 effaced, -2 station  EFM: FHR: 130 bpm, variability: moderate,  accelerations:  Present,  decelerations:  Absent Toco: Frequency: Every 2-3 minutes (no Pitocin) Labs: I have reviewed the patient's lab results.  Assessment & Plan:  G3P1011 @ 3361w1d, admitted for  Pregnancy and Labor/Delivery Management Progressing well Concern for macrosomia discussed  1. Pain management: epidural. 2. FWB: FHT category 1.  3. ID: GBS positive 4. Labor management:   Pitocin next when or if ctxs space out.    All discussed with patient, see orders  Annamarie MajorPaul Nieves Barberi, MD, Merlinda FrederickFACOG [facility], [facility] 03/27/2017  12:36 PM

## 2017-03-27 NOTE — Discharge Instructions (Signed)
Vaginal Delivery, Care After °Refer to this sheet in the next few weeks. These instructions provide you with information about caring for yourself after vaginal delivery. Your health care provider may also give you more specific instructions. Your treatment has been planned according to current medical practices, but problems sometimes occur. Call your health care provider if you have any problems or questions. °What can I expect after the procedure? °After vaginal delivery, it is common to have: °· Some bleeding from your vagina. °· Soreness in your abdomen, your vagina, and the area of skin between your vaginal opening and your anus (perineum). °· Pelvic cramps. °· Fatigue. ° °Follow these instructions at home: °Medicines °· Take over-the-counter and prescription medicines only as told by your health care provider. °· If you were prescribed an antibiotic medicine, take it as told by your health care provider. Do not stop taking the antibiotic until it is finished. °Driving ° °· Do not drive or operate heavy machinery while taking prescription pain medicine. °· Do not drive for 24 hours if you received a sedative. °Lifestyle °· Do not drink alcohol. This is especially important if you are breastfeeding or taking medicine to relieve pain. °· Do not use tobacco products, including cigarettes, chewing tobacco, or e-cigarettes. If you need help quitting, ask your health care provider. °Eating and drinking °· Drink at least 8 eight-ounce glasses of water every day unless you are told not to by your health care provider. If you choose to breastfeed your baby, you may need to drink more water than this. °· Eat high-fiber foods every day. These foods may help prevent or relieve constipation. High-fiber foods include: °? Whole grain cereals and breads. °? Brown rice. °? Beans. °? Fresh fruits and vegetables. °Activity °· Return to your normal activities as told by your health care provider. Ask your health care provider  what activities are safe for you. °· Rest as much as possible. Try to rest or take a nap when your baby is sleeping. °· Do not lift anything that is heavier than your baby or 10 lb (4.5 kg) until your health care provider says that it is safe. °· Talk with your health care provider about when you can engage in sexual activity. This may depend on your: °? Risk of infection. °? Rate of healing. °? Comfort and desire to engage in sexual activity. °Vaginal Care °· If you have an episiotomy or a vaginal tear, check the area every day for signs of infection. Check for: °? More redness, swelling, or pain. °? More fluid or blood. °? Warmth. °? Pus or a bad smell. °· Do not use tampons or douches until your health care provider says this is safe. °· Watch for any blood clots that may pass from your vagina. These may look like clumps of dark red, brown, or black discharge. °General instructions °· Keep your perineum clean and dry as told by your health care provider. °· Wear loose, comfortable clothing. °· Wipe from front to back when you use the toilet. °· Ask your health care provider if you can shower or take a bath. If you had an episiotomy or a perineal tear during labor and delivery, your health care provider may tell you not to take baths for a certain length of time. °· Wear a bra that supports your breasts and fits you well. °· If possible, have someone help you with household activities and help care for your baby for at least a few days after   you leave the hospital. °· Keep all follow-up visits for you and your baby as told by your health care provider. This is important. °Contact a health care provider if: °· You have: °? Vaginal discharge that has a bad smell. °? Difficulty urinating. °? Pain when urinating. °? A sudden increase or decrease in the frequency of your bowel movements. °? More redness, swelling, or pain around your episiotomy or vaginal tear. °? More fluid or blood coming from your episiotomy or  vaginal tear. °? Pus or a bad smell coming from your episiotomy or vaginal tear. °? A fever. °? A rash. °? Little or no interest in activities you used to enjoy. °? Questions about caring for yourself or your baby. °· Your episiotomy or vaginal tear feels warm to the touch. °· Your episiotomy or vaginal tear is separating or does not appear to be healing. °· Your breasts are painful, hard, or turn red. °· You feel unusually sad or worried. °· You feel nauseous or you vomit. °· You pass large blood clots from your vagina. If you pass a blood clot from your vagina, save it to show to your health care provider. Do not flush blood clots down the toilet without having your health care provider look at them. °· You urinate more than usual. °· You are dizzy or light-headed. °· You have not breastfed at all and you have not had a menstrual period for 12 weeks after delivery. °· You have stopped breastfeeding and you have not had a menstrual period for 12 weeks after you stopped breastfeeding. °Get help right away if: °· You have: °? Pain that does not go away or does not get better with medicine. °? Chest pain. °? Difficulty breathing. °? Blurred vision or spots in your vision. °? Thoughts about hurting yourself or your baby. °· You develop pain in your abdomen or in one of your legs. °· You develop a severe headache. °· You faint. °· You bleed from your vagina so much that you fill two sanitary pads in one hour. °This information is not intended to replace advice given to you by your health care provider. Make sure you discuss any questions you have with your health care provider. °Document Released: 03/21/2000 Document Revised: 09/05/2015 Document Reviewed: 04/08/2015 °Elsevier Interactive Patient Education © 2018 [employer] ° °

## 2017-03-27 NOTE — Progress Notes (Signed)
  Labor Progress Note   21 y.o. [id number] @ 2844w1d , admitted for  Pregnancy, Labor Management.   Subjective:  Pain and pressure  Objective:  BP 120/70 (BP Location: Left Arm)   Pulse 71   Temp 97.9 F (36.6 C) (Oral)   Resp 18   Ht 5\' 2"  (1.575 m)   Wt 181 lb (82.1 kg)   LMP 06/19/2016   BMI 33.11 kg/m  Abd: moderate Extr: trace to 1+ bilateral pedal edema SVE: CERVIX: 4 cm dilated, 80 effaced, -3 station  EFM: FHR: 130 bpm, variability: moderate,  accelerations:  Present,  decelerations:  Absent Toco: Frequency: Every 2-3 minutes Labs: I have reviewed the patient's lab results.   Assessment & Plan:  G3P1011 @ 6544w1d, admitted for  Pregnancy and Labor/Delivery Management  1. Pain management: none. 2. FWB: FHT category 1.  3. ID: GBS positive 4. Labor management: One dose Cytotec done.  Pitocin next when or if ctxs space out.  Epidural when ready  All discussed with patient, see orders  Annamarie MajorPaul Harriet Sutphen, MD, Merlinda FrederickFACOG [facility], [facility] 03/27/2017  9:59 AM

## 2017-03-27 NOTE — H&P (Signed)
  CC: Scheduled Induction  HPI: Allison Black is a 21 y.o. female presenting for Induction of Labor secondary to fetal macrosomia. Her EFW  On 11/30 was 7lbs 14oz with the [facility] in the 97%.  Her pregnancy has been complicated by anemia. Her 1GTT was 139. She is groub b strep positive. She reports that her last delivery was complicated by a significant laceration. She has asthma which is controlled with inhalers.  She is feeling well this morning. Occasional contractions with lower back pain. Positive fetal movement. No vaginal bleeding.    OB History    Gravida Para Term Preterm AB Living   3 1 1   1 1    SAB TAB Ectopic Multiple Live Births   1       1     Past Medical History:  Diagnosis Date  . Asthma   . Heart murmur    History reviewed. No pertinent surgical history. Family History: family history is not on file. Social History:  reports that she has quit smoking. she has never used smokeless tobacco. She reports that she uses drugs. Drug: Marijuana. She reports that she does not drink alcohol.     Maternal Diabetes: No Genetic Screening: Normal Maternal Ultrasounds/Referrals: Abnormal:  Findings:   Other: Fetal Macrosomia Fetal Ultrasounds or other Referrals:  None Maternal Substance Abuse:  No Significant Maternal Medications:  None Significant Maternal Lab Results:  Lab values include: Group B Strep positive Other Comments:  None  Review of Systems  Constitutional: Negative for chills, fever, malaise/fatigue and weight loss.  HENT: Negative for congestion, hearing loss and sinus pain.   Eyes: Negative for blurred vision and double vision.  Respiratory: Negative for cough, sputum production, shortness of breath and wheezing.   Cardiovascular: Negative for chest pain, palpitations, orthopnea and leg swelling.  Gastrointestinal: Negative for abdominal pain, constipation, diarrhea, nausea and vomiting.  Genitourinary: Negative for dysuria, flank pain, frequency, hematuria and  urgency.  Musculoskeletal: Positive for back pain. Negative for falls and joint pain.  Skin: Negative for itching and rash.  Neurological: Negative for dizziness and headaches.  Psychiatric/Behavioral: Negative for depression, substance abuse and suicidal ideas. The patient is not nervous/anxious.    Maternal Medical History:  Reason for admission: Nausea.    Dilation: 1 Effacement (%): Thick Station: -3 Exam by:: MBS Blood pressure 117/70, pulse 83, temperature 98 F (36.7 C), temperature source Oral, resp. rate 16, height 5\' 2"  (1.575 m), weight 181 lb (82.1 kg), last menstrual period 06/19/2016. Exam Physical Exam  Constitutional: She appears well-developed and well-nourished.  HENT:  Head: Normocephalic and atraumatic.  Cardiovascular: Normal rate, regular rhythm and normal heart sounds.  Respiratory: Effort normal and breath sounds normal.  GI: Soft.  Genitourinary: Vagina normal and uterus normal.  Genitourinary Comments: Cervix :1 cm, thick, posterior, -3, soft    Prenatal labs: ABO, Rh: B/Positive/-- (05/07 1117) Antibody: Negative (05/07 1117) Rubella: 1.64 (05/07 1117) RPR: Non Reactive (09/24 0931)  HBsAg: Negative (05/07 1117)  HIV:    GBS: Positive (11/30 1543)   Assessment/Plan: 21yo G3P1011 at 40 weeks 1 days gestational age.  Will proceed with induction of labor. Will start with cytotec. Will start IV antibiotics for GBS when she is in the active phase of labor. Will allow patient regular diet for breakfast.    Allison Black R Allison Black 03/27/2017, 6:19 AM

## 2017-03-27 NOTE — Discharge Summary (Signed)
OB Discharge Summary     Patient Name: Allison Black DOB: 08-31-1995 MRN: [id number]  Date of admission: 03/27/2017 Delivering MD: Letitia Libraobert Paul Harris, MD  Date of Delivery: 03/27/2017  Date of discharge: 03/29/2017  Admitting diagnosis: Induction of labor for history of macrosomia Intrauterine pregnancy: 7885w1d     Secondary diagnosis: Macrosomia     Discharge diagnosis: Term Pregnancy Delivered, No other diagnosis                         Hospital course:  Induction of Labor With Vaginal Delivery   21 y.o. yo [id number] at 8285w1d was admitted to the hospital 03/27/2017 for induction of labor.  Indication for induction: Macrosomia, >39 weeks..  Patient had an uncomplicated labor course as follows: Membrane Rupture Time/Date: 10:50 AM ,03/27/2017   Intrapartum Procedures: Episiotomy:                                           Lacerations:  2nd degree [3]  Patient had delivery of a Viable infant.  Information for the patient's newborn:  Allison Black, Girl Margeart [[id number]][[id number]]  Delivery Method: Vag-Spont   03/27/2017  Details of delivery can be found in separate delivery note.  Patient had a routine postpartum course. Patient is discharged home 03/29/17.                                                                 Post partum procedures:none  Complications: None  Physical exam on 03/29/2017: Vitals:   03/28/17 1912 03/28/17 2346 03/29/17 0015 03/29/17 0723  BP: 114/61 116/62  119/67  Pulse: 69 68  (!) 51  Resp: 18 19  18   Temp: (!) 97.4 F (36.3 C) (!) 97.4 F (36.3 C) 98 F (36.7 C) 97.7 F (36.5 C)  TempSrc: Oral Oral Axillary Oral  SpO2: 99% 98%  99%  Weight:      Height:       General: alert, cooperative and no distress Lochia: appropriate Uterine Fundus: firm Incision: N/A DVT Evaluation: No evidence of DVT seen on physical exam. No cords or calf tenderness. No significant calf/ankle edema.  Labs: Lab Results  Component Value Date   WBC 10.9 03/28/2017   HGB  10.1 (L) 03/28/2017   HCT 31.0 (L) 03/28/2017   MCV 80.1 03/28/2017   PLT 180 03/28/2017   CMP Latest Ref Rng & Units 03/29/2016  Glucose 65 - 99 mg/dL 96  BUN 6 - 20 mg/dL 16  Creatinine 6.060.44 - 3.011.00 mg/dL 6.010.69  Sodium 093135 - 235145 mmol/L 137  Potassium 3.5 - 5.1 mmol/L 3.8  Chloride 101 - 111 mmol/L 105  CO2 22 - 32 mmol/L 24  Calcium 8.9 - 10.3 mg/dL 9.4  Total Protein 6.5 - 8.1 g/dL -  Total Bilirubin 0.3 - 1.2 mg/dL -  Alkaline Phos 38 - 573126 U/L -  AST 15 - 41 U/L -  ALT 14 - 54 U/L -    Discharge instruction: per After Visit Summary.  Medications:  Allergies as of 03/29/2017   No Known Allergies     Medication List    TAKE these medications  ALBUTEROL SULFATE HFA IN Inhale 2 puffs into the lungs every 4 (four) hours as needed (wheezing).   fluticasone 50 MCG/BLIST diskus inhaler Commonly known as:  FLOVENT DISKUS Inhale 1 puff into the lungs 2 (two) times daily.   FUSION 65-65-25-30 MG Caps Take 1 tablet by mouth daily.   ibuprofen 600 MG tablet Commonly known as:  ADVIL,MOTRIN Take 1 tablet (600 mg total) by mouth every 6 (six) hours.   PRENATAL VITAMIN PO Take by mouth.            Discharge Care Instructions  (From admission, onward)        Start     Ordered   03/29/17 0000  Discharge wound care:    Comments:  Perform wound care instructions   03/29/17 0954      Diet: routine diet  Activity: Advance as tolerated. Pelvic rest for 6 weeks.   Outpatient follow up: Follow-up Information    Nadara MustardHarris, Robert P, MD. Schedule an appointment as soon as possible for a visit in 2 week(s).   Specialty:  Obstetrics and Gynecology Why:  Early postpartum f/u for depression screen Contact information: [address] [place] [address] [phone]             Postpartum contraception: undecided, but states she wants her husband to get vasectomy Rhogam Given postpartum: no Rubella vaccine given postpartum: no Varicella vaccine given  postpartum: no TDaP given antepartum or postpartum: Yes AP on 01/27/17 Flu Vaccine given on 01/27/17  Newborn Data: Live born child  Birth Weight: 9 lb 8.7 oz (4330 g) APGAR: 8, 9  Newborn Delivery   Birth date/time:  03/27/2017 15:35:00 Delivery type:  Vaginal, Spontaneous      Baby Feeding: formula  Disposition:home with mother  SIGNED: Thomasene MohairStephen Nikko Goldwire, MD 03/29/2017 9:55 AM

## 2017-03-27 NOTE — Anesthesia Procedure Notes (Addendum)
Epidural Patient location during procedure: OB Start time: 03/27/2017 10:40 AM End time: 03/27/2017 10:47 AM  Staffing Anesthesiologist: Ival Pacer, Cleda MccreedyJoseph K, MD Performed: anesthesiologist   Preanesthetic Checklist Completed: patient identified, site marked, surgical consent, pre-op evaluation, timeout performed, IV checked, risks and benefits discussed and monitors and equipment checked  Epidural Patient position: sitting Prep: Betadine Patient monitoring: heart rate, continuous pulse ox and blood pressure Approach: midline Location: L3-L4 Injection technique: LOR saline  Needle:  Needle type: Tuohy  Needle gauge: 17 G Needle length: 9 cm and 9 Needle insertion depth: 5.5 cm Catheter type: closed end flexible Catheter size: 19 Gauge Catheter at skin depth: 10.5 cm Test dose: negative and 1.5% lidocaine with Epi 1:200 K  Assessment Sensory level: T10 Events: blood not aspirated, injection not painful, no injection resistance, negative IV test and no paresthesia  Additional Notes 1 attempt, transient left sided paresthesia that resolved with needle retraction and right re direction.  By nursing report, patient was having fetal decels pre block while supine so epidural was bolused with patient in left lateral position  Pt. Evaluated and documentation done after procedure finished. Patient identified. Risks/Benefits/Options discussed with patient including but not limited to bleeding, infection, nerve damage, paralysis, failed block, incomplete pain control, headache, blood pressure changes, nausea, vomiting, reactions to medication both or allergic, itching and postpartum back pain. Confirmed with bedside nurse the patient's most recent platelet count. Confirmed with patient that they are not currently taking any anticoagulation, have any bleeding history or any family history of bleeding disorders. Patient expressed understanding and wished to proceed. All questions were  answered. Sterile technique was used throughout the entire procedure. Please see nursing notes for vital signs. Test dose was given through epidural catheter and negative prior to continuing to dose epidural or start infusion. Warning signs of high block given to the patient including shortness of breath, tingling/numbness in hands, complete motor block, or any concerning symptoms with instructions to call for help. Patient was given instructions on fall risk and not to get out of bed. All questions and concerns addressed with instructions to call with any issues or inadequate analgesia.   Patient tolerated the insertion well without immediate complications.Reason for block:procedure for pain

## 2017-03-28 LAB — CBC
HCT: 31 % — ABNORMAL LOW (ref 35.0–47.0)
HEMOGLOBIN: 10.1 g/dL — AB (ref 12.0–16.0)
MCH: 26 pg (ref 26.0–34.0)
MCHC: 32.5 g/dL (ref 32.0–36.0)
MCV: 80.1 fL (ref 80.0–100.0)
Platelets: 180 10*3/uL (ref 150–440)
RBC: 3.87 MIL/uL (ref 3.80–5.20)
RDW: 15.3 % — ABNORMAL HIGH (ref 11.5–14.5)
WBC: 10.9 10*3/uL (ref 3.6–11.0)

## 2017-03-28 LAB — RPR: RPR Ser Ql: NONREACTIVE

## 2017-03-28 MED ORDER — VITAMIN C 500 MG PO TABS
500.0000 mg | ORAL_TABLET | Freq: Every day | ORAL | Status: DC
  Administered 2017-03-28 – 2017-03-29 (×2): 500 mg via ORAL
  Filled 2017-03-28 (×2): qty 1

## 2017-03-28 MED ORDER — FERROUS SULFATE 325 (65 FE) MG PO TABS
325.0000 mg | ORAL_TABLET | Freq: Every day | ORAL | Status: DC
  Administered 2017-03-28 – 2017-03-29 (×2): 325 mg via ORAL
  Filled 2017-03-28 (×2): qty 1

## 2017-03-28 NOTE — Progress Notes (Signed)
Post Partum Day 1 Subjective: up ad lib, voiding, tolerating PO and complains of perineal soreness. Concerned about baby spitting up formula. First child was switched to soy formula.  Objective: Blood pressure 113/75, pulse 63, temperature 97.7 F (36.5 C), temperature source Oral, resp. rate 20, height 5\' 2"  (1.575 m), weight 82.1 kg (181 lb), last menstrual period 06/19/2016, SpO2 98 %, unknown if currently breastfeeding.  Physical Exam:  General: alert, cooperative and no distress Lochia: appropriate Uterine Fundus: firm at U/ML/NT Perineum: slightly swollen and bruised, healing well DVT Evaluation: No evidence of DVT seen on physical exam.  Recent Labs    03/27/17 0524 03/28/17 0457  HGB 9.7* 10.1*  HCT 30.5* 31.0*  WBC 7.7 10.9  PLT 199 180    Assessment/Plan: stable PPD #1 Plan for discharge tomorrow  Discuss switching formula with pediatrician B POSITIVE/ RI/ VI TDAP and flu vaccine UTD Mild anemia: iron and vitamins Bottle feeding Contraception: ?   LOS: 1 day   Farrel ConnersColleen Keiran Sias 03/28/2017, 9:00 AM

## 2017-03-28 NOTE — Anesthesia Postprocedure Evaluation (Signed)
Anesthesia Post Note  Patient: Allison ChamberHeather Farish  Procedure(s) Performed: AN AD HOC LABOR EPIDURAL  Patient location during evaluation: Mother Baby Anesthesia Type: Epidural Level of consciousness: awake and alert and oriented Pain management: pain level controlled Vital Signs Assessment: post-procedure vital signs reviewed and stable Respiratory status: spontaneous breathing Cardiovascular status: blood pressure returned to baseline Postop Assessment: no headache and no backache Anesthetic complications: no Comments: Doing well     Last Vitals:  Vitals:   03/28/17 0814 03/28/17 1132  BP: 113/75   Pulse: 63   Resp: 20   Temp: 36.5 C 36.7 C  SpO2: 98%     Last Pain:  Vitals:   03/28/17 1132  TempSrc: Oral  PainSc:                  [name]

## 2017-03-28 NOTE — Plan of Care (Signed)
Vs stable; up on her own now; fundal checks: 1 below umbilicus, firm, but to the right everytime; low bleeding; pt has voided three times since delivery (once in birthplace and twice on mother/baby); taking scheduled motrin for pain (has low pain)

## 2017-03-28 NOTE — Progress Notes (Signed)
Handoff report given to Verlon AuLeslie, RN to continue care. Patient Mom resting in room, continue to assess.  Paged Timothy LassoKawatu, MD x2 to hold and/or cancel baby's discharge due to Mom not having discharge order. No response.  Continue to assess.  ZH, RN. 03/28/17

## 2017-03-29 MED ORDER — IBUPROFEN 600 MG PO TABS: 600.0000 mg | ORAL_TABLET | Freq: Four times a day (QID) | ORAL | 0 refills | Status: DC

## 2017-03-29 NOTE — Progress Notes (Signed)
Reviewed D/C instructions with pt and family. Pt verbalized understanding of teaching. Discharged to home via W/C. Pt to schedule f/u appt.  

## 2017-03-30 ENCOUNTER — Encounter: Payer: Self-pay | Admitting: Obstetrics & Gynecology

## 2017-04-01 NOTE — Telephone Encounter (Signed)
Call and see if still has sx's (this note is 132 days old).  Appt if so, or ER if severe chest pains.

## 2017-04-01 NOTE — Telephone Encounter (Signed)
2:50 today

## 2017-04-01 NOTE — Telephone Encounter (Signed)
Please advise 

## 2017-04-01 NOTE — Telephone Encounter (Signed)
Please advise overbooking due to no cancellations

## 2017-04-01 NOTE — Telephone Encounter (Signed)
Called and spoke with Patient about workng patient in to schedule. Pt doesn't have ride to come in today. Advise patiert call back if pain persist and if becomes severe to follow up with ER, Per RPH.

## 2017-04-01 NOTE — Telephone Encounter (Signed)
Please call and schedule a appt  with RPH, pt aware you will be calling, thanks

## 2017-05-06 ENCOUNTER — Encounter: Payer: Self-pay | Admitting: Obstetrics & Gynecology

## 2017-05-06 ENCOUNTER — Ambulatory Visit (INDEPENDENT_AMBULATORY_CARE_PROVIDER_SITE_OTHER): Payer: Medicaid Other | Admitting: Obstetrics & Gynecology

## 2017-05-06 VITALS — BP 120/80 | HR 86 | Ht 62.0 in | Wt 150.0 lb

## 2017-05-06 DIAGNOSIS — Z124 Encounter for screening for malignant neoplasm of cervix: Secondary | ICD-10-CM

## 2017-05-06 NOTE — Progress Notes (Signed)
  OBSTETRICS POSTPARTUM CLINIC PROGRESS NOTE  Subjective:     Allison Black is a 22 y.o. [phone] female who presents for a postpartum visit. She is 6 weeks postpartum following a Term pregnancy and delivery by Vaginal, no problems at delivery.  I have fully reviewed the prenatal and intrapartum course. Anesthesia: none.  Postpartum course has been complicated by uncomplicated.  Baby is feeding by Bottle.  Bleeding: patient has  resumed menses.  Bowel function is normal. Bladder function is normal.  Patient is not sexually active. Contraception method desired is none.  Postpartum depression screening: positive. [place] 17.  The following portions of the patient's history were reviewed and updated as appropriate: allergies, current medications, past family history, past medical history, past social history, past surgical history and problem list.  Review of Systems Pertinent items noted in HPI and remainder of comprehensive ROS otherwise negative.  Objective:    BP 120/80   Pulse 86   Ht 5\' 2"  (1.575 m)   Wt 150 lb (68 kg)   LMP 06/19/2016   BMI 27.44 kg/m   General:  alert and no distress   Breasts:  inspection negative, no nipple discharge or bleeding, no masses or nodularity palpable  Lungs: clear to auscultation bilaterally  Heart:  regular rate and rhythm, S1, S2 normal, no murmur, click, rub or gallop  Abdomen: soft, non-tender; bowel sounds normal; no masses,  no organomegaly.     Vulva:  normal  Vagina: normal vagina, no discharge, exudate, lesion, or erythema  Cervix:  no cervical motion tenderness and no lesions  Corpus: normal size, contour, position, consistency, mobility, non-tender  Adnexa:  normal adnexa and no mass, fullness, tenderness  Rectal Exam: Not performed.        Assessment:  Post Partum Care visit 1. Screening for cervical cancer - IGP, rfx Aptima HPV ASCU  2. Encounter for routine postpartum follow-up  Plan:  See orders and Patient  Instructions Contraceptive counseling for all types discussed Follow up in: 12 months or as needed.   PAP Depression counseling and treatment options offered and discussed.    Prefers no tx at this time Considering IUD for [facility], none desired today  Annamarie MajorPaul Zemirah Krasinski, MD, Merlinda FrederickFACOG [facility], [facility] 05/06/2017  10:02 AM

## 2017-05-07 LAB — IGP, RFX APTIMA HPV ASCU: PAP Smear Comment: 0

## 2017-07-20 ENCOUNTER — Ambulatory Visit: Payer: Medicaid Other | Admitting: Obstetrics & Gynecology

## 2017-11-02 ENCOUNTER — Other Ambulatory Visit: Payer: Self-pay

## 2017-11-02 ENCOUNTER — Emergency Department: Payer: Self-pay

## 2017-11-02 ENCOUNTER — Emergency Department
Admission: EM | Admit: 2017-11-02 | Discharge: 2017-11-02 | Disposition: A | Discharge status: Home / Self Care | Payer: Self-pay | Attending: Emergency Medicine | Admitting: Emergency Medicine

## 2017-11-02 ENCOUNTER — Encounter: Payer: Self-pay | Admitting: Emergency Medicine

## 2017-11-02 DIAGNOSIS — Z87891 Personal history of nicotine dependence: Secondary | ICD-10-CM | POA: Insufficient documentation

## 2017-11-02 DIAGNOSIS — J4521 Mild intermittent asthma with (acute) exacerbation: Secondary | ICD-10-CM | POA: Insufficient documentation

## 2017-11-02 DIAGNOSIS — J45901 Unspecified asthma with (acute) exacerbation: Secondary | ICD-10-CM

## 2017-11-02 DIAGNOSIS — J209 Acute bronchitis, unspecified: Secondary | ICD-10-CM | POA: Insufficient documentation

## 2017-11-02 LAB — BASIC METABOLIC PANEL
Anion gap: 8 (ref 5–15)
BUN: 12 mg/dL (ref 6–20)
CHLORIDE: 104 mmol/L (ref 98–111)
CO2: 28 mmol/L (ref 22–32)
Calcium: 9.2 mg/dL (ref 8.9–10.3)
Creatinine, Ser: 0.53 mg/dL (ref 0.44–1.00)
GFR calc Af Amer: >60 mL/min (ref >60)
GLUCOSE: 108 mg/dL — AB (ref 70–99)
POTASSIUM: 3.6 mmol/L (ref 3.5–5.1)
Sodium: 140 mmol/L (ref 135–145)

## 2017-11-02 LAB — CBC
HEMATOCRIT: 39.8 % (ref 35.0–47.0)
Hemoglobin: 13.4 g/dL (ref 12.0–16.0)
MCH: 28.5 pg (ref 26.0–34.0)
MCHC: 33.5 g/dL (ref 32.0–36.0)
MCV: 85 fL (ref 80.0–100.0)
Platelets: 289 10*3/uL (ref 150–440)
RBC: 4.69 MIL/uL (ref 3.80–5.20)
RDW: 13.5 % (ref 11.5–14.5)
WBC: 11.1 10*3/uL — ABNORMAL HIGH (ref 3.6–11.0)

## 2017-11-02 LAB — TROPONIN I: Troponin I: <0.03 ng/mL (ref <0.03)

## 2017-11-02 MED ORDER — PREDNISONE 20 MG PO TABS
40.0000 mg | ORAL_TABLET | Freq: Once | ORAL | Status: AC
  Administered 2017-11-02: 40 mg via ORAL
  Filled 2017-11-02: qty 2

## 2017-11-02 MED ORDER — PREDNISONE 20 MG PO TABS: 40.0000 mg | ORAL_TABLET | Freq: Every day | ORAL | 0 refills | Status: DC

## 2017-11-02 MED ORDER — IPRATROPIUM-ALBUTEROL 0.5-2.5 (3) MG/3ML IN SOLN
3.0000 mL | Freq: Once | RESPIRATORY_TRACT | Status: AC
  Administered 2017-11-02: 3 mL via RESPIRATORY_TRACT
  Filled 2017-11-02: qty 3

## 2017-11-02 NOTE — ED Triage Notes (Signed)
Patient presents to the ED with chest tightness x 2 days and shortness of breath.  Patient reports history of asthma and states she feels like she usually does with asthma exacerbations.  Patient reports needing steroids in the past and reports she does not currently have insurance and can't see her normal doctor.  Patient is in no obvious distress at this time.

## 2017-11-02 NOTE — ED Notes (Signed)
ED Provider at bedside. 

## 2017-11-02 NOTE — Discharge Instructions (Signed)
We believe that your symptoms are caused today by an exacerbation of your asthma.  Please take the prescribed medications and any medications that you have at home.  Follow up with your doctor as recommended.  If you develop any new or worsening symptoms, including but not limited to fever, persistent vomiting, worsening shortness of breath, or other symptoms that concern you, please return to the Emergency Department immediately.  

## 2017-11-02 NOTE — ED Provider Notes (Signed)
[facility] Emergency Department Provider Note   ____________________________________________   First MD Initiated Contact with Patient 11/02/17 1334     (approximate)  I have reviewed the triage vital signs and the nursing notes.   HISTORY  Chief Complaint Chest Pain    HPI Allison Black is a 22 y.o. female presents for evaluation for discomfort in her chest and some slight feeling of wheezing and shortness of breath  Patient reports that she is been using her inhalers and occasionally her nebulizer a couple of times daily at home for the last 2 days.  Symptoms started with a slightly scratchy throat runny nose and a dry cough about 3 days ago and those symptoms are seemingly getting better but she is noticed some slight amount of wheezing and a slight feeling of tightness in her chest.  Reports same multiple times with "asthma".  Denies ever having to be hospitalized or intubated.  She reports she is actually feeling quite well now after using a nebulizer before she went to work, but she starts a new job tomorrow and feels that she is going to need a few days of prednisone to keep her asthma symptoms at bay  Denies pregnancy.  Last menstrual cycle was normal and 2 weeks ago.   Past Medical History:  Diagnosis Date  . Asthma   . Heart murmur     Patient Active Problem List   Diagnosis Date Noted  . Need for immunization against influenza 01/27/2017  . Episodic lightheadedness 12/19/2016  . Depression with anxiety 10/09/2016    History reviewed. No pertinent surgical history.  Prior to Admission medications   Medication Sig Start Date End Date Taking? Authorizing Provider  ALBUTEROL SULFATE HFA IN Inhale 2 puffs into the lungs every 4 (four) hours as needed (wheezing).    [provider]  Fe Fum-Fe Poly-Vit C-Lactobac (FUSION) 65-65-25-30 MG CAPS Take 1 tablet by mouth daily. Patient not taking: Reported on 05/06/2017 12/19/16    Farrel Conners, CNM  fluticasone (FLOVENT DISKUS) 50 MCG/BLIST diskus inhaler Inhale 1 puff into the lungs 2 (two) times daily.    [provider]  ibuprofen (ADVIL,MOTRIN) 600 MG tablet Take 1 tablet (600 mg total) by mouth every 6 (six) hours. Patient not taking: Reported on 05/06/2017 03/29/17   Conard Novak, MD  predniSONE (DELTASONE) 20 MG tablet Take 2 tablets (40 mg total) by mouth daily with breakfast. 11/02/17   Sharyn Creamer, MD  Prenatal Vit-Fe Fumarate-FA (PRENATAL VITAMIN PO) Take by mouth.    [provider]    Allergies Patient has no known allergies.  Family History  Problem Relation Age of Onset  . Diabetes Maternal Grandmother   . Diabetes Cousin     Social History Social History   Tobacco Use  . Smoking status: Former Games developer  . Smokeless tobacco: Never Used  Substance Use Topics  . Alcohol use: No  . Drug use: Yes    Types: Marijuana    Review of Systems Constitutional: No fever/chills Eyes: No visual changes. ENT: No sore throat.  Was scratchy a couple days ago. Cardiovascular: Denies chest pain.  Reports that this feels like a slight tightness in the chest. Respiratory: Denies shortness of breath presently, but some earlier in the day before using her nebulizer at home and some wheezing that seems to be better now after nebulizer. Gastrointestinal: No abdominal pain.  No nausea, no vomiting.  Genitourinary: Negative for dysuria.  Denies pregnancy. Musculoskeletal: Negative for back  pain. Skin: Negative for rash. Neurological: Negative for headaches, focal weakness or numbness.    ____________________________________________   PHYSICAL EXAM:  VITAL SIGNS: ED Triage Vitals [11/02/17 1326]  Enc Vitals Group     BP 107/66     Pulse Rate 84     Resp 18     Temp 97.9 F (36.6 C)     Temp Source Oral     SpO2 99 %     Weight 150 lb (68 kg)     Height 5\' 2"  (1.575 m)     Head Circumference      Peak Flow      Pain  Score 6     Pain Loc      Pain Edu?      Excl. in GC?     Constitutional: Alert and oriented. Well appearing and in no acute distress. Eyes: Conjunctivae are normal. Head: Atraumatic. Nose: No congestion/rhinnorhea. Mouth/Throat: Mucous membranes are moist. Neck: No stridor.   Cardiovascular: Normal rate, regular rhythm. Grossly normal heart sounds.  Good peripheral circulation. Respiratory: Normal respiratory effort except for which is slightly prolonged expiratory phase with minimal amount of slight wheezing in the lower lobes and otherwise clear lungs.  She speaks in full and complete sentences without any distress and denies dyspnea at this time..  No retractions. Gastrointestinal: Soft and nontender. No distention. Musculoskeletal: No lower extremity tenderness nor edema. Neurologic:  Normal speech and language. No gross focal neurologic deficits are appreciated.  Skin:  Skin is warm, dry and intact. No rash noted.  No leg swelling or edema. Psychiatric: Mood and affect are normal. Speech and behavior are normal.  No history of blood clots.  Takes no birth controls.  No leg swelling.  No long trips or travel.  No recent hospitalizations or trauma.  Does not smoke.  ____________________________________________   LABS (all labs ordered are listed, but only abnormal results are displayed)  Labs Reviewed  BASIC METABOLIC PANEL - Abnormal; Notable for the following components:      Result Value   Glucose, Bld 108 (*)    All other components within normal limits  CBC - Abnormal; Notable for the following components:   WBC 11.1 (*)    All other components within normal limits  TROPONIN I   ____________________________________________  EKG  Reviewed and entered by me at 1330 Heart rate 80 QRS 85 QTc 400 Normal sinus rhythm, some slight artifact/motion, no evidence of ischemia.  Single T wave inverted in lead III is likely  nonpathologic. ____________________________________________  RADIOLOGY    CXR reviewed, negative for acute ____________________________________________   PROCEDURES  Procedure(s) performed: None  Procedures  Critical Care performed: No  ____________________________________________   INITIAL IMPRESSION / ASSESSMENT AND PLAN / ED COURSE  Pertinent labs & imaging results that were available during my care of the patient were reviewed by me and considered in my medical decision making (see chart for details).  Patient presents for chest tightness, improving after nebulizers but reports symptoms that seem to be consistent with upper respiratory infection/bronchitis followed by suspect slight exacerbation of mild asthma.  She is resting comfortably with no distress.  We will start her on prednisone, reports she has adequate supply of albuterol at home to last for the next several days already.  Symptoms appear consistent with very mild asthma attack.  No evidence of acute cardiac disease. PERC negative for PE risk.  No symptoms to suggest acute coronary syndrome.  No pneumothorax.  No evidence  of pneumonia.     ----------------------------------------- 2:51 PM on 11/02/2017 -----------------------------------------  Patient resting comfortably with clear lungs.  Normal and full sentences.  Reports she feels well.  Comfortable with plan for discharge with prednisone, continued use of her albuterol at home and careful return precautions.  Appears appropriate for ongoing outpatient management.  Return precautions and treatment recommendations and follow-up discussed with the patient who is agreeable with the plan.    ____________________________________________   FINAL CLINICAL IMPRESSION(S) / ED DIAGNOSES  Final diagnoses:  Mild asthma with acute exacerbation, unspecified whether persistent  Acute bronchitis, unspecified organism      NEW MEDICATIONS STARTED DURING THIS  VISIT:  Discharge Medication List as of 11/02/2017  2:50 PM    START taking these medications   Details  predniSONE (DELTASONE) 20 MG tablet Take 2 tablets (40 mg total) by mouth daily with breakfast., Starting Mon 11/02/2017, Print         Note:  This document was prepared using Dragon voice recognition software and may include unintentional dictation errors.     Sharyn CreamerQuale, Garv Kuechle, MD 11/02/17 Izell [id number]

## 2017-11-02 NOTE — ED Notes (Signed)
Pt alert and oriented X4, active, cooperative, pt in NAD. RR even and unlabored, color WNL.  Pt informed to return if any life threatening symptoms occur.  Discharge and followup instructions reviewed.  

## 2018-01-06 ENCOUNTER — Encounter: Payer: Self-pay | Admitting: Family Medicine

## 2018-01-06 ENCOUNTER — Ambulatory Visit: Payer: Self-pay | Admitting: Family Medicine

## 2018-01-06 VITALS — BP 112/78 | HR 71 | Ht 63.0 in | Wt 140.0 lb

## 2018-01-06 DIAGNOSIS — B9689 Other specified bacterial agents as the cause of diseases classified elsewhere: Secondary | ICD-10-CM

## 2018-01-06 DIAGNOSIS — Z7689 Persons encountering health services in other specified circumstances: Secondary | ICD-10-CM

## 2018-01-06 DIAGNOSIS — F418 Other specified anxiety disorders: Secondary | ICD-10-CM

## 2018-01-06 DIAGNOSIS — Z23 Encounter for immunization: Secondary | ICD-10-CM

## 2018-01-06 DIAGNOSIS — J4520 Mild intermittent asthma, uncomplicated: Secondary | ICD-10-CM

## 2018-01-06 DIAGNOSIS — N760 Acute vaginitis: Secondary | ICD-10-CM

## 2018-01-06 LAB — WET PREP FOR TRICH, YEAST, CLUE
Clue Cell Exam: POSITIVE — AB
TRICHOMONAS EXAM: NEGATIVE
YEAST EXAM: NEGATIVE

## 2018-01-06 MED ORDER — METRONIDAZOLE 500 MG PO TABS
500.0000 mg | ORAL_TABLET | Freq: Two times a day (BID) | ORAL | 0 refills | Status: DC
Start: 2018-01-06 — End: 2018-03-16

## 2018-01-06 MED ORDER — SERTRALINE HCL 100 MG PO TABS: 50.0000 mg | ORAL_TABLET | Freq: Every day | ORAL | 0 refills | Status: DC

## 2018-01-06 NOTE — Patient Instructions (Addendum)
Hyaluronic acid   Influenza (Flu) Vaccine (Inactivated or Recombinant): What You Need to Know 1. Why get vaccinated? Influenza ("flu") is a contagious disease that spreads around the [place] every year, usually between October and May. Flu is caused by influenza viruses, and is spread mainly by coughing, sneezing, and close contact. Anyone can get flu. Flu strikes suddenly and can last several days. Symptoms vary by age, but can include:  fever/chills  sore throat  muscle aches  fatigue  cough  headache  runny or stuffy nose  Flu can also lead to pneumonia and blood infections, and cause diarrhea and seizures in children. If you have a medical condition, such as heart or lung disease, flu can make it worse. Flu is more dangerous for some people. Infants and young children, people 23 years of age and older, pregnant women, and people with certain health conditions or a weakened immune system are at greatest risk. Each year thousands of people in the [place] die from flu, and many more are hospitalized. Flu vaccine can:  keep you from getting flu,  make flu less severe if you do get it, and  keep you from spreading flu to your family and other people. 2. Inactivated and recombinant flu vaccines A dose of flu vaccine is recommended every flu season. Children 6 months through 23 years of age may need two doses during the same flu season. Everyone else needs only one dose each flu season. Some inactivated flu vaccines contain a very small amount of a mercury-based preservative called thimerosal. Studies have not shown thimerosal in vaccines to be harmful, but flu vaccines that do not contain thimerosal are available. There is no live flu virus in flu shots. They cannot cause the flu. There are many flu viruses, and they are always changing. Each year a new flu vaccine is made to protect against three or four viruses that are likely to cause disease in the upcoming flu  season. But even when the vaccine doesn't exactly match these viruses, it may still provide some protection. Flu vaccine cannot prevent:  flu that is caused by a virus not covered by the vaccine, or  illnesses that look like flu but are not.  It takes about 2 weeks for protection to develop after vaccination, and protection lasts through the flu season. 3. Some people should not get this vaccine Tell the person who is giving you the vaccine:  If you have any severe, life-threatening allergies. If you ever had a life-threatening allergic reaction after a dose of flu vaccine, or have a severe allergy to any part of this vaccine, you may be advised not to get vaccinated. Most, but not all, types of flu vaccine contain a small amount of egg protein.  If you ever had Guillain-Barr Syndrome (also called GBS). Some people with a history of GBS should not get this vaccine. This should be discussed with your doctor.  If you are not feeling well. It is usually okay to get flu vaccine when you have a mild illness, but you might be asked to come back when you feel better.  4. Risks of a vaccine reaction With any medicine, including vaccines, there is a chance of reactions. These are usually mild and go away on their own, but serious reactions are also possible. Most people who get a flu shot do not have any problems with it. Minor problems following a flu shot include:  soreness, redness, or swelling where the shot was given  hoarseness  sore, red or itchy eyes  cough  fever  aches  headache  itching  fatigue  If these problems occur, they usually begin soon after the shot and last 1 or 2 days. More serious problems following a flu shot can include the following:  There may be a small increased risk of Guillain-Barre Syndrome (GBS) after inactivated flu vaccine. This risk has been estimated at 1 or 2 additional cases per million people vaccinated. This is much lower than the risk of  severe complications from flu, which can be prevented by flu vaccine.  Young children who get the flu shot along with pneumococcal vaccine (PCV13) and/or DTaP vaccine at the same time might be slightly more likely to have a seizure caused by fever. Ask your doctor for more information. Tell your doctor if a child who is getting flu vaccine has ever had a seizure.  Problems that could happen after any injected vaccine:  People sometimes faint after a medical procedure, including vaccination. Sitting or lying down for about 15 minutes can help prevent fainting, and injuries caused by a fall. Tell your doctor if you feel dizzy, or have vision changes or ringing in the ears.  Some people get severe pain in the shoulder and have difficulty moving the arm where a shot was given. This happens very rarely.  Any medication can cause a severe allergic reaction. Such reactions from a vaccine are very rare, estimated at about 1 in a million doses, and would happen within a few minutes to a few hours after the vaccination. As with any medicine, there is a very remote chance of a vaccine causing a serious injury or death. The safety of vaccines is always being monitored. For more information, visit: http://www.aguilar.org/ 5. What if there is a serious reaction? What should I look for? Look for anything that concerns you, such as signs of a severe allergic reaction, very high fever, or unusual behavior. Signs of a severe allergic reaction can include hives, swelling of the face and throat, difficulty breathing, a fast heartbeat, dizziness, and weakness. These would start a few minutes to a few hours after the vaccination. What should I do?  If you think it is a severe allergic reaction or other emergency that can't wait, call 9-1-1 and get the person to the nearest hospital. Otherwise, call your doctor.  Reactions should be reported to the Vaccine Adverse Event Reporting System (VAERS). Your doctor should  file this report, or you can do it yourself through the VAERS web site at www.vaers.SamedayNews.es, or by calling 9495471212. ? VAERS does not give medical advice. 6. The National Vaccine Injury Compensation Program The [employer] Vaccine Injury Compensation Program (VICP) is a federal program that was created to compensate people who may have been injured by certain vaccines. Persons who believe they may have been injured by a vaccine can learn about the program and about filing a claim by calling (585) 449-9244 or visiting the [place] website at GoldCloset.com.ee. There is a time limit to file a claim for compensation. 7. How can I learn more?  Ask your healthcare provider. He or she can give you the vaccine package insert or suggest other sources of information.  Call your local or state health department.  Contact the Centers for Disease Control and Prevention (CDC): ? Call 714 336 9254 (1-800-CDC-INFO) or ? Visit CDC's website at https://gibson.com/ Vaccine Information Statement, Inactivated Influenza Vaccine (11/11/2013) This information is not intended to replace advice given to you by your health care  provider. Make sure you discuss any questions you have with your health care provider. Document Released: 01/16/2006 Document Revised: 12/13/2015 Document Reviewed: 12/13/2015 Elsevier Interactive Patient Education  2017 [employer].

## 2018-01-06 NOTE — Progress Notes (Signed)
BP 112/78   Pulse 71   Ht 5\' 3"  (1.6 m)   Wt 140 lb (63.5 kg)   SpO2 98%   BMI 24.80 kg/m    Subjective:    Patient ID: Allison Black, female    DOB: 07-14-1995, 22 y.o.   MRN: [id number]  HPI: Allison Black is a 22 y.o. female  Chief Complaint  Patient presents with  . Establish Care  . Menstrual Problem    Smelly discharge since birth of second child, also irregular.    Here today to establish care. Recent pregnancy, delivered 03/2017. No longer breastfeeding.   Hx of mood problems and anxiety, has tried medications in the past without good results but didn't try them long. Now has stopped driving because of her fears. Feels these issues are significantly impacting her quality of life and ability to care for her kids. Denies SI/HI.   Fhx of narcotic abuse. Wishes to avoid any controlled/addictive substances.   Asthma, not currently on flovent. Just albuterol prn. No recent flares.   Vaginal discharge and odor since December when she had her child. Has not tried anything OTC. Notes sxs much worse around time of her period. No rashes, concerns for STIs, fevers, chills, dysuria.   Relevant past medical, surgical, family and social history reviewed and updated as indicated. Interim medical history since our last visit reviewed. Allergies and medications reviewed and updated.  Review of Systems  Per HPI unless specifically indicated above     Objective:    BP 112/78   Pulse 71   Ht 5\' 3"  (1.6 m)   Wt 140 lb (63.5 kg)   SpO2 98%   BMI 24.80 kg/m   Wt Readings from Last 3 Encounters:  01/06/18 140 lb (63.5 kg)  11/02/17 150 lb (68 kg)  05/06/17 150 lb (68 kg)    Physical Exam  Constitutional: She is oriented to person, place, and time. She appears well-developed and well-nourished. No distress.  HENT:  Head: Atraumatic.  Eyes: Conjunctivae and EOM are normal.  Neck: Normal range of motion. Neck supple.  Cardiovascular: Normal rate, regular rhythm and normal  heart sounds.  Pulmonary/Chest: Effort normal and breath sounds normal. No respiratory distress. She has no wheezes.  Abdominal: Soft. Bowel sounds are normal. There is no tenderness.  Musculoskeletal: Normal range of motion.  Lymphadenopathy:    She has no cervical adenopathy.  Neurological: She is alert and oriented to person, place, and time.  Skin: Skin is warm and dry.  Psychiatric: She has a normal mood and affect. Her behavior is normal. Thought content normal.  Nursing note and vitals reviewed.   Results for orders placed or performed in visit on 01/06/18  WET PREP FOR TRICH, YEAST, CLUE  Result Value Ref Range   Trichomonas Exam Negative Negative   Yeast Exam Negative Negative   Clue Cell Exam Positive (A) Negative      Assessment & Plan:   Problem List Items Addressed This Visit      Respiratory   Asthma    Stable without recent flares. Continue prn albuterol.         Other   Depression with anxiety - Primary    Will start zoloft and monitor closely for benefit. Risks and benefits reviewed.       Relevant Medications   sertraline (ZOLOFT) 100 MG tablet   Other Relevant Orders   WET PREP FOR TRICH, YEAST, CLUE (Completed)    Other Visit Diagnoses  Encounter to establish care       Needs flu shot       Relevant Orders   Flu Vaccine QUAD 6+ mos PF IM (Fluarix Quad PF) (Completed)   BV (bacterial vaginosis)       Tx with flagyl, probiotics. Avoid sexual contact through duration of tx.    Relevant Medications   metroNIDAZOLE (FLAGYL) 500 MG tablet       Follow up plan: Return in about 4 weeks (around 02/03/2018) for Mood, anxiety f/u.

## 2018-01-17 NOTE — Assessment & Plan Note (Signed)
Stable without recent flares. Continue prn albuterol.

## 2018-01-17 NOTE — Assessment & Plan Note (Signed)
Will start zoloft and monitor closely for benefit. Risks and benefits reviewed.

## 2018-02-03 ENCOUNTER — Ambulatory Visit: Payer: Medicaid Other | Admitting: Family Medicine

## 2018-02-26 ENCOUNTER — Encounter: Payer: Self-pay | Admitting: Emergency Medicine

## 2018-02-26 ENCOUNTER — Emergency Department: Payer: Medicaid Other

## 2018-02-26 ENCOUNTER — Emergency Department
Admission: EM | Admit: 2018-02-26 | Discharge: 2018-02-26 | Disposition: A | Discharge status: Home / Self Care | Payer: Medicaid Other | Attending: Emergency Medicine | Admitting: Emergency Medicine

## 2018-02-26 DIAGNOSIS — F17210 Nicotine dependence, cigarettes, uncomplicated: Secondary | ICD-10-CM | POA: Insufficient documentation

## 2018-02-26 DIAGNOSIS — R062 Wheezing: Secondary | ICD-10-CM | POA: Insufficient documentation

## 2018-02-26 DIAGNOSIS — Z79899 Other long term (current) drug therapy: Secondary | ICD-10-CM | POA: Insufficient documentation

## 2018-02-26 DIAGNOSIS — J209 Acute bronchitis, unspecified: Secondary | ICD-10-CM | POA: Insufficient documentation

## 2018-02-26 MED ORDER — BENZONATATE 100 MG PO CAPS: 200.0000 mg | ORAL_CAPSULE | Freq: Three times a day (TID) | ORAL | 0 refills | Status: DC | PRN

## 2018-02-26 MED ORDER — IPRATROPIUM-ALBUTEROL 0.5-2.5 (3) MG/3ML IN SOLN
3.0000 mL | Freq: Once | RESPIRATORY_TRACT | Status: AC
  Administered 2018-02-26: 3 mL via RESPIRATORY_TRACT
  Filled 2018-02-26: qty 3

## 2018-02-26 MED ORDER — AZITHROMYCIN 250 MG PO TABS: ORAL_TABLET | ORAL | 0 refills | Status: AC

## 2018-02-26 MED ORDER — ALBUTEROL SULFATE (2.5 MG/3ML) 0.083% IN NEBU: 2.5000 mg | INHALATION_SOLUTION | Freq: Once | RESPIRATORY_TRACT | Status: DC

## 2018-02-26 NOTE — ED Notes (Signed)
FIRST NURSE NOTE:  Pt c/o cough, pt has hx of asthma, states she thinks she has bronchitis.

## 2018-02-26 NOTE — ED Triage Notes (Signed)
Pt stating hx of asthma and is concerned that she may have bronchitis again. Pt stating her voice is hoarse, body aches, and cough. Pt stating green mucous. Pt stating her inhalers are not working to help all her sx. Pt well appearing and in NAD. Pt stating she is out of her nebulizer treatments.

## 2018-02-26 NOTE — ED Notes (Signed)
See triage note  Presents with cough and body aches.  States cough has been prod at times afebrile on arrival   Voice is hoarse

## 2018-02-26 NOTE — ED Provider Notes (Signed)
[facility] Emergency Department Provider Note   ____________________________________________   First MD Initiated Contact with Patient 02/26/18 1338     (approximate)  I have reviewed the triage vital signs and the nursing notes.   HISTORY  Chief Complaint Cough    HPI Allison Black is a 22 y.o. female patient presents with wheezing and coughing.  Patient has history of asthma as she believes the coughing spell has led him to bronchitis.  Patient also complain of decreased voice volume, body aches, and a productive sputum.  Patient also requesting a refill her nebulized solution.  Patient denies nausea, vomiting, diarrhea.  Patient rates her body ache as a 7/10.  Past Medical History:  Diagnosis Date  . Asthma   . Heart murmur   . Panic attack     Patient Active Problem List   Diagnosis Date Noted  . Asthma 02/10/2017  . Need for immunization against influenza 01/27/2017  . Episodic lightheadedness 12/19/2016  . Depression with anxiety 10/09/2016    History reviewed. No pertinent surgical history.  Prior to Admission medications   Medication Sig Start Date End Date Taking? Authorizing Provider  ALBUTEROL SULFATE HFA IN Inhale 2 puffs into the lungs every 4 (four) hours as needed (wheezing).    [provider]  azithromycin (ZITHROMAX Z-PAK) 250 MG tablet Take 2 tablets (500 mg) on  Day 1,  followed by 1 tablet (250 mg) once daily on Days 2 through 5. 02/26/18 03/03/18  Joni Reining, PA-C  benzonatate (TESSALON PERLES) 100 MG capsule Take 2 capsules (200 mg total) by mouth 3 (three) times daily as needed. 02/26/18 02/26/19  Joni Reining, PA-C  Fe Fum-Fe Poly-Vit C-Lactobac (FUSION) 65-65-25-30 MG CAPS Take 1 tablet by mouth daily. Patient not taking: Reported on 05/06/2017 12/19/16   Farrel Conners, CNM  ibuprofen (ADVIL,MOTRIN) 600 MG tablet Take 1 tablet (600 mg total) by mouth every 6 (six) hours. Patient not taking:  Reported on 05/06/2017 03/29/17   Conard Novak, MD  metroNIDAZOLE (FLAGYL) 500 MG tablet Take 1 tablet (500 mg total) by mouth 2 (two) times daily. 01/06/18   Particia Nearing, PA-C  Prenatal Vit-Fe Fumarate-FA (PRENATAL VITAMIN PO) Take by mouth.    [provider]  sertraline (ZOLOFT) 100 MG tablet Take 0.5 tablets (50 mg total) by mouth daily. 01/06/18   Particia Nearing, PA-C    Allergies Patient has no known allergies.  Family History  Problem Relation Age of Onset  . Diabetes Maternal Grandmother   . Diabetes Cousin     Social History Social History   Tobacco Use  . Smoking status: Current Every Day Smoker  . Smokeless tobacco: Never Used  Substance Use Topics  . Alcohol use: No  . Drug use: Yes    Types: Marijuana    Review of Systems  Constitutional: No fever/chills.  Myalgia. Eyes: No visual changes. ENT: No sore throat. Cardiovascular: Denies chest pain. Respiratory: Denies shortness of breath.  Productive cough. Gastrointestinal: No abdominal pain.  No nausea, no vomiting.  No diarrhea.  No constipation. Genitourinary: Negative for dysuria. Musculoskeletal: Negative for back pain. Skin: Negative for rash. Neurological: Negative for headaches, focal weakness or numbness.   ____________________________________________   PHYSICAL EXAM:  VITAL SIGNS: ED Triage Vitals  Enc Vitals Group     BP 02/26/18 1256 (!) 99/59     Pulse Rate 02/26/18 1256 74     Resp 02/26/18 1256 18     Temp 02/26/18  1256 (!) 97.4 F (36.3 C)     Temp Source 02/26/18 1256 Oral     SpO2 02/26/18 1256 97 %     Weight 02/26/18 1259 130 lb (59 kg)     Height 02/26/18 1259 5\' 2"  (1.575 m)     Head Circumference --      Peak Flow --      Pain Score 02/26/18 1259 7     Pain Loc --      Pain Edu? --      Excl. in GC? --    Constitutional: Alert and oriented. Well appearing and in no acute distress. Nose: Edematous nasal turbinates.  Mouth/Throat: Mucous  membranes are moist.  Oropharynx non-erythematous.  Postnasal drainage. Neck: No stridor.  Hematological/Lymphatic/Immunilogical: No cervical lymphadenopathy. Cardiovascular: Normal rate, regular rhythm. Grossly normal heart sounds.  Good peripheral circulation. Respiratory: Normal respiratory effort.  No retractions. Lungs mild wheezing and productive cough. Gastrointestinal: Soft and nontender. No distention. No abdominal bruits. No CVA tenderness. Musculoskeletal: No lower extremity tenderness nor edema.  No joint effusions. Neurologic:  Normal speech and language. No gross focal neurologic deficits are appreciated. No gait instability. Skin:  Skin is warm, dry and intact. No rash noted. Psychiatric: Mood and affect are normal. Speech and behavior are normal.  ____________________________________________   LABS (all labs ordered are listed, but only abnormal results are displayed)  Labs Reviewed - No data to display ____________________________________________  EKG   ____________________________________________  RADIOLOGY  ED MD interpretation:    Official radiology report(s): Dg Chest 2 View  Result Date: 02/26/2018 CLINICAL DATA:  Asthma with bronchitis. EXAM: CHEST - 2 VIEW COMPARISON:  11/02/2017 FINDINGS: The heart size and mediastinal contours are within normal limits. Both lungs are clear. No significant pulmonary hyperinflation. The visualized skeletal structures are unremarkable. IMPRESSION: No active cardiopulmonary disease. Electronically Signed   By: Tollie Ethavid  Kwon M.D.   On: 02/26/2018 13:44    ____________________________________________   PROCEDURES  Procedure(s) performed: None  Procedures  Critical Care performed: No  ____________________________________________   INITIAL IMPRESSION / ASSESSMENT AND PLAN / ED COURSE  As part of my medical decision making, I reviewed the following data within the electronic MEDICAL RECORD NUMBER    Patient presents  with URI signs and symptoms and a green productive cough.  Patient did have wheezing has not improved with inhalers.  Discussed chest x-ray with patient showed no acute pulmonary process.  Patient given DuoNeb which decreased her wheezing but increase her cough.  Patient given discharge care instructions and advised take medication as directed.     ____________________________________________   FINAL CLINICAL IMPRESSION(S) / ED DIAGNOSES  Final diagnoses:  Acute bronchitis, unspecified organism     ED Discharge Orders         Ordered    azithromycin (ZITHROMAX Z-PAK) 250 MG tablet     02/26/18 1404    benzonatate (TESSALON PERLES) 100 MG capsule  3 times daily PRN     02/26/18 1404           Note:  This document was prepared using Dragon voice recognition software and may include unintentional dictation errors.    Joni ReiningSmith, Ronald K, PA-C 02/26/18 1536    Emily FilbertWilliams, Jonathan E, MD 03/01/18 [phone]

## 2018-03-16 ENCOUNTER — Other Ambulatory Visit: Payer: Self-pay

## 2018-03-16 ENCOUNTER — Emergency Department
Admission: EM | Admit: 2018-03-16 | Discharge: 2018-03-16 | Disposition: A | Discharge status: Home / Self Care | Payer: Medicaid Other | Attending: Emergency Medicine | Admitting: Emergency Medicine

## 2018-03-16 ENCOUNTER — Encounter: Payer: Self-pay | Admitting: Emergency Medicine

## 2018-03-16 DIAGNOSIS — F17210 Nicotine dependence, cigarettes, uncomplicated: Secondary | ICD-10-CM | POA: Insufficient documentation

## 2018-03-16 DIAGNOSIS — F1210 Cannabis abuse, uncomplicated: Secondary | ICD-10-CM | POA: Insufficient documentation

## 2018-03-16 DIAGNOSIS — J020 Streptococcal pharyngitis: Secondary | ICD-10-CM | POA: Insufficient documentation

## 2018-03-16 DIAGNOSIS — Z79899 Other long term (current) drug therapy: Secondary | ICD-10-CM | POA: Insufficient documentation

## 2018-03-16 DIAGNOSIS — H9201 Otalgia, right ear: Secondary | ICD-10-CM | POA: Insufficient documentation

## 2018-03-16 DIAGNOSIS — M7918 Myalgia, other site: Secondary | ICD-10-CM | POA: Insufficient documentation

## 2018-03-16 DIAGNOSIS — J45909 Unspecified asthma, uncomplicated: Secondary | ICD-10-CM | POA: Insufficient documentation

## 2018-03-16 LAB — GROUP A STREP BY PCR: Group A Strep by PCR: DETECTED — AB

## 2018-03-16 LAB — INFLUENZA PANEL BY PCR (TYPE A & B)
Influenza A By PCR: NEGATIVE
Influenza B By PCR: NEGATIVE

## 2018-03-16 MED ORDER — MAGIC MOUTHWASH: ORAL | 0 refills | Status: DC

## 2018-03-16 MED ORDER — IBUPROFEN 600 MG PO TABS
600.0000 mg | ORAL_TABLET | Freq: Once | ORAL | Status: AC
  Administered 2018-03-16: 600 mg via ORAL
  Filled 2018-03-16: qty 1

## 2018-03-16 MED ORDER — FIRST-DUKES MOUTHWASH MT SUSP: 5.0000 mL | Freq: Three times a day (TID) | OROMUCOSAL | 0 refills | Status: DC

## 2018-03-16 MED ORDER — AMOXICILLIN 875 MG PO TABS: 875.0000 mg | ORAL_TABLET | Freq: Two times a day (BID) | ORAL | 0 refills | Status: DC

## 2018-03-16 NOTE — ED Notes (Signed)
See triage note   Presents with sore throat,body aches and right ear pain   Afebrile on arrival

## 2018-03-16 NOTE — Discharge Instructions (Signed)
Follow-up with your primary care provider if any continued problems.  Increase fluids and stay hydrated.  You may take Tylenol or ibuprofen as needed for throat pain or fever.  Begin taking amoxicillin 875 twice daily for the next 10 days.  Do not stop taking the medication as your strep throat will come back.

## 2018-03-16 NOTE — ED Triage Notes (Signed)
2 days sore throat, body aches.  Now right ear pain with swollowing

## 2018-03-16 NOTE — ED Provider Notes (Signed)
[facility] Emergency Department Provider Note  ____________________________________________   First MD Initiated Contact with Patient 03/16/18 1120     (approximate)  I have reviewed the triage vital signs and the nursing notes.   HISTORY  Chief Complaint Sore Throat; Generalized Body Aches; and Otalgia   HPI Allison Black is a 22 y.o. female presents to the ED with complaint of 2 days sore throat body aches.  Patient also complains of her right ear hurting especially when she swallows.  Patient works in [employer] and has been exposed to multiple people.  She states that temperature is been as high as 102 at home.  Past Medical History:  Diagnosis Date  . Asthma   . Heart murmur   . Panic attack     Patient Active Problem List   Diagnosis Date Noted  . Asthma 02/10/2017  . Need for immunization against influenza 01/27/2017  . Episodic lightheadedness 12/19/2016  . Depression with anxiety 10/09/2016    History reviewed. No pertinent surgical history.  Prior to Admission medications   Medication Sig Start Date End Date Taking? Authorizing Provider  ALBUTEROL SULFATE HFA IN Inhale 2 puffs into the lungs every 4 (four) hours as needed (wheezing).    [provider]  amoxicillin (AMOXIL) 875 MG tablet Take 1 tablet (875 mg total) by mouth 2 (two) times daily. 03/16/18   Tommi Rumps, PA-C  Diphenhyd-Hydrocort-Nystatin (FIRST-DUKES MOUTHWASH) SUSP Use as directed 5 mLs in the mouth or throat 4 (four) times daily -  before meals and at bedtime. 03/16/18   Tommi Rumps, PA-C  magic mouthwash SOLN 5 ml ac and hs 03/16/18   Bridget Hartshorn L, PA-C  sertraline (ZOLOFT) 100 MG tablet Take 0.5 tablets (50 mg total) by mouth daily. 01/06/18   Particia Nearing, PA-C    Allergies Patient has no known allergies.  Family History  Problem Relation Age of Onset  . Diabetes Maternal Grandmother   . Diabetes Cousin     Social  History Social History   Tobacco Use  . Smoking status: Current Every Day Smoker  . Smokeless tobacco: Never Used  Substance Use Topics  . Alcohol use: No  . Drug use: Yes    Types: Marijuana    Review of Systems Constitutional: Positive fever/chills Eyes: No visual changes. ENT: Positive sore throat.  Right ear pain. Cardiovascular: Denies chest pain. Respiratory: Denies shortness of breath. Gastrointestinal: No abdominal pain.  No nausea, no vomiting. Musculoskeletal: Positive for muscle aches. Skin: Negative for rash. Neurological: Negative for headaches, focal weakness or numbness. ___________________________________________   PHYSICAL EXAM:  VITAL SIGNS: ED Triage Vitals [03/16/18 1057]  Enc Vitals Group     BP (!) 94/59     Pulse Rate 95     Resp 16     Temp 97.7 F (36.5 C)     Temp Source Oral     SpO2 100 %     Weight 129 lb 13.6 oz (58.9 kg)     Height 5\' 2"  (1.575 m)     Head Circumference      Peak Flow      Pain Score 8     Pain Loc      Pain Edu?      Excl. in GC?    Constitutional: Alert and oriented. Well appearing and in no acute distress. Eyes: Conjunctivae are normal.  Head: Atraumatic. Nose: No congestion/rhinnorhea. Mouth/Throat: Mucous membranes are moist.  Oropharynx mild erythema without  exudate present. Neck: No stridor.   Hematological/Lymphatic/Immunilogical: No cervical lymphadenopathy. Cardiovascular: Normal rate, regular rhythm. Grossly normal heart sounds.  Good peripheral circulation. Respiratory: Normal respiratory effort.  No retractions. Lungs CTAB. Musculoskeletal: Moves upper and lower extremities then difficulty normal gait was noted. Neurologic:  Normal speech and language. No gross focal neurologic deficits are appreciated.  Skin:  Skin is warm, dry and intact. No rash noted. Psychiatric: Mood and affect are normal. Speech and behavior are normal.  ____________________________________________   LABS (all labs  ordered are listed, but only abnormal results are displayed)  Labs Reviewed  GROUP A STREP BY PCR - Abnormal; Notable for the following components:      Result Value   Group A Strep by PCR DETECTED (*)    All other components within normal limits  INFLUENZA PANEL BY PCR (TYPE A & B)    PROCEDURES  Procedure(s) performed: None  Procedures  Critical Care performed: No  ____________________________________________   INITIAL IMPRESSION / ASSESSMENT AND PLAN / ED COURSE  As part of my medical decision making, I reviewed the following data within the electronic MEDICAL RECORD NUMBER Notes from prior ED visits and [place] Controlled Substance Database  She presents to the ED with complaint of sore throat, body aches and right ear pain.  She is also had fever for the last several days.  Physical exam is suspicious for influenza versus strep pharyngitis.  Test were obtained and resulted with strep test being positive.  She was placed on amoxicillin 875 twice daily for 10 days.  She also requested Magic mouthwash for her discomfort.  Patient is encouraged to take Tylenol or ibuprofen as needed for throat pain and fever.  She is to follow-up with her PCP if any continued problems.  ____________________________________________   FINAL CLINICAL IMPRESSION(S) / ED DIAGNOSES  Final diagnoses:  Strep pharyngitis     ED Discharge Orders         Ordered    amoxicillin (AMOXIL) 875 MG tablet  2 times daily     03/16/18 1348    magic mouthwash SOLN  Status:  Discontinued    Note to Pharmacy:  Equal parts of diphenhydramine 12.5mg /375ml Viscous lidocaine 2% maalox   03/16/18 1411    magic mouthwash SOLN    Note to Pharmacy:  Equal parts of diphenhydramine 12.5mg /725ml Viscous lidocaine 2% maalox   03/16/18 1412    Diphenhyd-Hydrocort-Nystatin (FIRST-DUKES MOUTHWASH) SUSP  3 times daily before meals & bedtime     03/16/18 1417           Note:  This document was prepared using Dragon voice  recognition software and may include unintentional dictation errors.    Tommi RumpsSummers, Purnell Daigle L, PA-C 03/16/18 1609    Emily FilbertWilliams, Jonathan E, MD 03/17/18 [phone]

## 2018-07-31 ENCOUNTER — Other Ambulatory Visit: Payer: Self-pay

## 2018-07-31 ENCOUNTER — Ambulatory Visit
Admission: EM | Admit: 2018-07-31 | Discharge: 2018-07-31 | Disposition: A | Discharge status: Home / Self Care | Payer: Self-pay | Attending: Family Medicine | Admitting: Family Medicine

## 2018-07-31 ENCOUNTER — Encounter: Payer: Self-pay | Admitting: Emergency Medicine

## 2018-07-31 DIAGNOSIS — R062 Wheezing: Secondary | ICD-10-CM

## 2018-07-31 DIAGNOSIS — J4521 Mild intermittent asthma with (acute) exacerbation: Secondary | ICD-10-CM

## 2018-07-31 DIAGNOSIS — Z7189 Other specified counseling: Secondary | ICD-10-CM

## 2018-07-31 MED ORDER — PREDNISONE 20 MG PO TABS: 40.0000 mg | ORAL_TABLET | Freq: Every day | ORAL | 0 refills | Status: DC

## 2018-07-31 NOTE — Discharge Instructions (Addendum)
It was very nice meeting you today in clinic. Thank you for entrusting me with your care.   As discussed, your asthma is likely flaring due to a component of seasonal allergies.  Please utilize the medications that we discussed. Your prescriptions have been called in to your pharmacy.  Continue inhalers as previous prescribed. Start oral allergy medication (loratadine or cetirizine) daily.   Make arrangements to follow up with your regular doctor in 1 week for re-evaluation. If your symptoms/condition worsens, please seek follow up care either here or in the ER. Please remember, our [facility] providers are "right here with you" when you need [place].   Again, it was my pleasure to take care of you today. Thank you for choosing our clinic. I hope that you start to feel better quickly.   Quentin Mulling, MSN, APRN, FNP-C, CEN Advanced Practice Provider [facility]

## 2018-07-31 NOTE — ED Triage Notes (Signed)
Patient states that she has asthma.  Patient states that she has been using her inhaler and nebulizer at home.  Patient reports some tightness in her chest yesterday.  Patient denies fevers.

## 2018-07-31 NOTE — ED Provider Notes (Signed)
[address], [address] [place], [place] [address] [phone]   Name: Allison Black DOB: [birth year] MRN: [id number] CSN: [id number]  Arrival date and time:  07/31/18 1245  Chief Complaint:  Asthma  NOTE: Prior to seeing the patient today, I have reviewed the triage nursing documentation and vital signs. Clinical staff has updated patient's PMH/PSHx, current medication list, and drug allergies/intolerances to ensure comprehensive history available to assist in medical decision making.   History:   HPI: Allison Black is a 23 y.o. female who presents today with complaints of an acute exacerbation of her asthma. Patient notes that she developed central chest tightness yesterday while at work. She was subsequently sent home for concerns related to COVID-19. Patient with mild intermittent cough, which is chronic in nature. She notes increased shortness of breath associated with exertion. She has not experienced any fevers. Patient has been using her Albuterol MDI and SVN, which has helped to improve her symptoms "some". Patient reports that this will be her second exacerbation in the recent past.  Past Medical History:  Diagnosis Date  . Asthma   . Heart murmur   . Panic attack     History reviewed. No pertinent surgical history.  Family History  Problem Relation Age of Onset  . Diabetes Maternal Grandmother   . Diabetes Cousin     Social History   Socioeconomic History  . Marital status: Married    Spouse name: Allison Black  . Number of children: 2  . Years of education: Not on file  . Highest education level: Not on file  Occupational History  . Not on file  Social Needs  . Financial resource strain: Not on file  . Food insecurity:    Worry: Not on file    Inability: Not on file  . Transportation needs:    Medical: Not on file    Non-medical: Not on file  Tobacco Use  . Smoking status: Former Games developer  . Smokeless tobacco: Never Used  Substance and Sexual Activity  .  Alcohol use: No  . Drug use: Not Currently    Types: Marijuana  . Sexual activity: Yes    Birth control/protection: None  Lifestyle  . Physical activity:    Days per week: Not on file    Minutes per session: Not on file  . Stress: Not on file  Relationships  . Social connections:    Talks on phone: Not on file    Gets together: Not on file    Attends religious service: Not on file    Active member of club or organization: Not on file    Attends meetings of clubs or organizations: Not on file    Relationship status: Not on file  . Intimate partner violence:    Fear of current or ex partner: Not on file    Emotionally abused: Not on file    Physically abused: Not on file    Forced sexual activity: Not on file  Other Topics Concern  . Not on file  Social History Narrative   Son - Allison Black    Daughter - [name]    Patient Active Problem List   Diagnosis Date Noted  . Asthma 02/10/2017  . Need for immunization against influenza 01/27/2017  . Episodic lightheadedness 12/19/2016  . Depression with anxiety 10/09/2016    Home Medications:    Current Meds  Medication Sig  . ALBUTEROL SULFATE HFA IN Inhale 2 puffs into the lungs every 4 (four) hours as needed (wheezing).  Allergies:   Patient has no known allergies.  Review of Systems (ROS): Review of Systems  Constitutional: Negative for chills and fever.  HENT: Positive for sneezing. Negative for congestion, rhinorrhea, sinus pain and voice change.   Respiratory: Positive for cough (slight; NP), chest tightness, shortness of breath (exertional) and wheezing. Negative for stridor.   Cardiovascular: Negative for chest pain and palpitations.  Skin: Negative for color change and rash.  Neurological: Negative for dizziness, seizures, syncope, weakness and headaches.  Hematological: Negative for adenopathy.  All other systems reviewed and are negative.    Physical Exam:  Triage Vital Signs ED Triage Vitals  Enc Vitals  Group     BP 07/31/18 1259 (!) 94/56     Pulse Rate 07/31/18 1259 60     Resp 07/31/18 1259 14     Temp 07/31/18 1259 97.6 F (36.4 C)     Temp Source 07/31/18 1259 Oral     SpO2 07/31/18 1259 100 %     Weight 07/31/18 1255 120 lb (54.4 kg)     Height 07/31/18 1255  (1.575 m)     Head Circumference --      Peak Flow --      Pain Score 07/31/18 1254 6     Pain Loc --      Pain Edu? --      Excl. in GC? --     Physical Exam  Constitutional: She is oriented to person, place, and time and well-developed, well-nourished, and in no distress.  HENT:  Head: Normocephalic and atraumatic.  Mouth/Throat: Oropharynx is clear and moist and mucous membranes are normal.  Eyes: Pupils are equal, round, and reactive to light. Conjunctivae and EOM are normal.  Neck: Normal range of motion. Neck supple. No tracheal deviation present.  Cardiovascular: Normal rate, regular rhythm, normal heart sounds and intact distal pulses. Exam reveals no gallop and no friction rub.  No murmur heard. Pulmonary/Chest: Effort normal. No respiratory distress. She has wheezes (slight scattered expiratory). She has no rales.  Lymphadenopathy:    She has no cervical adenopathy.  Neurological: She is alert and oriented to person, place, and time.  Skin: Skin is warm and dry. No rash noted. No erythema.  Psychiatric: Mood, affect and judgment normal.  Nursing note and vitals reviewed.    Urgent Care Treatments / Results:   LABS: PLEASE NOTE: all labs that were ordered this encounter are listed, however only abnormal results are displayed. Labs Reviewed - No data to display  EKG: -None  RADIOLOGY: No results found.  PRODEDURES: Procedures  MEDICATIONS RECEIVED THIS VISIT: Medications - No data to display   Initial Impression / Assessment and Plan / Urgent Care Course:   Pertinent labs & imaging results that were available during my care of the patient were personally reviewed by me and considered in  my medical decision making (see chart for details).   [name] is a 23 y.o. female who presents to [facility] today with complaints of chest tightness and SOB that began while at work yesterday. Patient was sent home due to concerns for COVID-19. PMH (+) for asthma. She notes that this is her second recent exacerbation requiring treatment. She has not had any fevers. Patient using prescribed Albuterol MDI and SVN treatments at home, which have helped "some".   Exam today is reassuring. She is in NAD; no accessory muscle use or increased WOB noted. Patient is able to speak in complete sentences. Discussed flare of  known asthma with a likely related component of seasonal allergies. Will treat with prednisone 40 mg daily x 4 days. Patient to continue SVN/MDI treatments at home as previously prescribed by her PCP. She was encouraged to add daily allergy medication such as loratadine or cetirizine to hopefully prevent recurrent exacerbations. Discussed that there are no immediate concerns for COVID-19.   Current condition warrants patient being out of work in order to recover from her current asthma exacerbation. She was provided with the appropriate documentation to provide to his job that will allow for her to RTW on 08/03/2018 with no restrictions.   Discussed follow up with primary care physician this week for re-evaluation. I have reviewed the follow up and strict return precautions for any new or worsening symptoms. Patient is aware of symptoms that would be deemed urgent/emergent, and would thus require further evaluation either here or in the emergency department. At the time of discharge, she verbalized understanding and consent with the discharge plan as it was reviewed with her. All questions were fielded by provider and/or clinic staff prior to patient discharge.    Final Clinical Impressions(s) / Urgent Care Diagnoses:   Final diagnoses:  Mild intermittent asthma with exacerbation   Wheezing  Advice Given About Covid-19 Virus Infection    New Prescriptions:   Meds ordered this encounter  Medications  . predniSONE (DELTASONE) 20 MG tablet    Sig: Take 2 tablets (40 mg total) by mouth daily.    Dispense:  8 tablet    Refill:  0    Controlled Substance Prescriptions:  [place] Controlled Substance Registry consulted? Not Applicable  NOTE: This note was prepared using Dragon dictation software along with smaller phrase technology. Despite my best ability to proofread, there is the potential that transcriptional errors may still occur from this process, and are completely unintentional.     Verlee MonteGray, Kinan Safley E, NP 07/31/18 1333

## 2018-11-16 ENCOUNTER — Other Ambulatory Visit: Payer: Self-pay

## 2018-11-16 ENCOUNTER — Encounter: Payer: Self-pay | Admitting: Obstetrics and Gynecology

## 2018-11-16 ENCOUNTER — Ambulatory Visit (INDEPENDENT_AMBULATORY_CARE_PROVIDER_SITE_OTHER): Payer: Medicaid Other | Admitting: Obstetrics and Gynecology

## 2018-11-16 VITALS — BP 94/61 | HR 64 | Ht 62.0 in | Wt 128.1 lb

## 2018-11-16 DIAGNOSIS — N72 Inflammatory disease of cervix uteri: Secondary | ICD-10-CM

## 2018-11-16 DIAGNOSIS — B977 Papillomavirus as the cause of diseases classified elsewhere: Secondary | ICD-10-CM

## 2018-11-16 NOTE — Progress Notes (Signed)
Patient comes in today as a new patient for abnormal Pap. She has no concerns today.

## 2018-11-16 NOTE — Progress Notes (Signed)
HPI:      [name] is a 23 y.o. [id number] who LMP was Patient's last menstrual period was 10/23/2018.  Subjective:   She presents today to discuss an abnormal Pap smear which revealed negative cytologic abnormality but positive HPV with specific negative type XVI and negative type XVIII. This is her first abnormal Pap. She also asked multiple questions regarding her history of GBS during a previous pregnancy and regarding 2 occurrences of BV that she has had in the last year.    Hx: The following portions of the patient's history were reviewed and updated as appropriate:             She  has a past medical history of Asthma, Heart murmur, and Panic attack. She does not have any pertinent problems on file. She  has no past surgical history on file. Her family history includes Diabetes in her cousin and maternal grandmother. She  reports that she has been smoking. She has never used smokeless tobacco. She reports previous drug use. Drug: Marijuana. She reports that she does not drink alcohol. She currently has no medications in their medication list. She has No Known Allergies.       Review of Systems:  Review of Systems  Constitutional: Denied constitutional symptoms, night sweats, recent illness, fatigue, fever, insomnia and weight loss.  Eyes: Denied eye symptoms, eye pain, photophobia, vision change and visual disturbance.  Ears/Nose/Throat/Neck: Denied ear, nose, throat or neck symptoms, hearing loss, nasal discharge, sinus congestion and sore throat.  Cardiovascular: Denied cardiovascular symptoms, arrhythmia, chest pain/pressure, edema, exercise intolerance, orthopnea and palpitations.  Respiratory: Denied pulmonary symptoms, asthma, pleuritic pain, productive sputum, cough, dyspnea and wheezing.  Gastrointestinal: Denied, gastro-esophageal reflux, melena, nausea and vomiting.  Genitourinary: See HPI for additional information.  Musculoskeletal: Denied musculoskeletal  symptoms, stiffness, swelling, muscle weakness and myalgia.  Dermatologic: Denied dermatology symptoms, rash and scar.  Neurologic: Denied neurology symptoms, dizziness, headache, neck pain and syncope.  Psychiatric: Denied psychiatric symptoms, anxiety and depression.  Endocrine: Denied endocrine symptoms including hot flashes and night sweats.   Meds:   No current outpatient medications on file prior to visit.   No current facility-administered medications on file prior to visit.     Objective:     Vitals:   11/16/18 1451  BP: 94/61  Pulse: 64              Pap smear reviewed directly with the patient HPV typing reviewed with the patient  Assessment:    [id number] Patient Active Problem List   Diagnosis Date Noted  . Asthma 02/10/2017  . Need for immunization against influenza 01/27/2017  . Episodic lightheadedness 12/19/2016  . Depression with anxiety 10/09/2016     1. High risk human papilloma virus (HPV) infection of cervix     NEGATIVE CYTOLOGY   Plan:            1.  With positive HPV but negative cytology, especially with -16 and 18 I have recommended a follow-up Pap smear yearly.  She does not need colposcopy because of her negative cytology. We spent a great deal of time discussing the natural course and history of HPV and its relationship to cervical cancer.  All of her questions were answered. We have also discussed BV in detail-no relation to abnormal Pap smears- but we have discussed use of probiotics and the relationship of douching bubblebaths tight clothing etc. We have also discussed GBS because the patient experienced this with 1 of her  pregnancies and she was unsure if there was any relation. Orders No orders of the defined types were placed in this encounter.   No orders of the defined types were placed in this encounter.     F/U  Return in about 1 year (around 11/16/2019) for Annual Physical. I spent 33 minutes involved in the care of this patient  of which greater than 50% was spent discussing abnormal Pap smears HPV BV GBS.  All patient's questions were answered.  Finis Bud, M.D. 11/16/2018 4:02 PM

## 2018-11-19 ENCOUNTER — Encounter: Payer: Medicaid Other | Admitting: Obstetrics and Gynecology

## 2019-01-21 ENCOUNTER — Other Ambulatory Visit: Payer: Self-pay

## 2019-01-21 DIAGNOSIS — Z20822 Contact with and (suspected) exposure to covid-19: Secondary | ICD-10-CM

## 2019-01-23 ENCOUNTER — Telehealth: Payer: Self-pay | Admitting: Family Medicine

## 2019-01-23 LAB — NOVEL CORONAVIRUS, NAA: SARS-CoV-2, NAA: DETECTED — AB

## 2019-01-23 NOTE — Telephone Encounter (Signed)
Received call from the call center re: positive covid test  I called pt and it went to voicemail Left message informing her of test results  inst to isolate herself and go to ED if severe symptoms like shortness of breath  Will route to PCP

## 2019-01-24 NOTE — Telephone Encounter (Signed)
Noted  

## 2019-04-21 ENCOUNTER — Ambulatory Visit (INDEPENDENT_AMBULATORY_CARE_PROVIDER_SITE_OTHER)
Admission: RE | Admit: 2019-04-21 | Discharge: 2019-04-21 | Disposition: A | Discharge status: Home / Self Care | Payer: Medicaid Other | Source: Ambulatory Visit

## 2019-04-21 DIAGNOSIS — R319 Hematuria, unspecified: Secondary | ICD-10-CM

## 2019-04-21 DIAGNOSIS — N390 Urinary tract infection, site not specified: Secondary | ICD-10-CM

## 2019-04-21 MED ORDER — PHENAZOPYRIDINE HCL 200 MG PO TABS: 200.0000 mg | ORAL_TABLET | Freq: Three times a day (TID) | ORAL | 0 refills | Status: DC

## 2019-04-21 MED ORDER — CEPHALEXIN 500 MG PO CAPS: 500.0000 mg | ORAL_CAPSULE | Freq: Two times a day (BID) | ORAL | 0 refills | Status: AC

## 2019-04-21 NOTE — Discharge Instructions (Signed)
Begin Keflex twice daily for the next week May use Pyridium as needed for burning/irritation Drink plenty of fluids Please continue to monitor symptoms, follow-up in person for testing if symptoms not resolving with the above

## 2019-04-21 NOTE — ED Provider Notes (Signed)
Virtual Visit via Video Note:  [name]  initiated request for Telemedicine visit with [facility] Urgent [facility] team. I connected with [name]  on 04/21/2019 at 2:51 PM  for a synchronized telemedicine visit using a video enabled HIPPA compliant telemedicine application. I verified that I am speaking with [name]  using two identifiers. [name] C [name], PA-C  was physically located in a [facility] Urgent care site and [name] was located at a different location.   The limitations of evaluation and management by telemedicine as well as the availability of in-person appointments were discussed. Patient was informed that she  may incur a bill ( including co-pay) for this virtual visit encounter. [name]  expressed understanding and gave verbal consent to proceed with virtual visit.     History of Present Illness:Allison Black  is a 24 y.o. female presents for evaluation of possible UTI.  Patient states that over the past 4 days she has had dysuria, urinary frequency and urgency.  She has noticed hematuria over the past couple days.  She is also started developed some itching which she notices at other times other than with urination.  Denies any vaginal discharge.  Does have history of prior UTIs and feels similar.  Does also note that she has history of BV which symptoms are not fully consistent with BV.  She denies any fevers, nausea or vomiting.  Denies dental pain.  Does report some mild low back pain.  Has menstrual cycle was 3 weeks ago, denies possibility of pregnancy.   No Known Allergies   Past Medical History:  Diagnosis Date  . Asthma   . Heart murmur   . Panic attack      Social History   Tobacco Use  . Smoking status: Current Every Day Smoker  . Smokeless tobacco: Never Used  Substance Use Topics  . Alcohol use: No  . Drug use: Not Currently    Types: Marijuana        Observations/Objective: Physical Exam  Constitutional: She is  oriented to person, place, and time and well-developed, well-nourished, and in no distress. No distress.  HENT:  Head: Normocephalic and atraumatic.  Pulmonary/Chest: Effort normal. No respiratory distress.  Speaking in full sentences  Neurological: She is alert and oriented to person, place, and time.  Speech clear, face symmetric     Assessment and Plan:    ICD-10-CM   1. Urinary tract infection with hematuria, site unspecified  N39.0    R31.9     Given hematuria and symptoms we will treat for UTI today with Keflex twice daily x1 week.  Deferring treatment for BV as well and recommending to follow-up in person if not improving with Keflex.  Push fluids.  Pyridium as needed for dysuria.  Discussed strict return precautions. Patient verbalized understanding and is agreeable with plan.    Follow Up Instructions:     I discussed the assessment and treatment plan with the patient. The patient was provided an opportunity to ask questions and all were answered. The patient agreed with the plan and demonstrated an understanding of the instructions.   The patient was advised to call back or seek an in-person evaluation if the symptoms worsen or if the condition fails to improve as anticipated.      [name], PA-C  04/21/2019 2:51 PM         [name] C, PA-C 04/21/19 1452

## 2019-09-15 ENCOUNTER — Ambulatory Visit (INDEPENDENT_AMBULATORY_CARE_PROVIDER_SITE_OTHER)
Admission: RE | Admit: 2019-09-15 | Discharge: 2019-09-15 | Disposition: A | Discharge status: Home / Self Care | Payer: Medicaid Other | Source: Ambulatory Visit

## 2019-09-15 DIAGNOSIS — N898 Other specified noninflammatory disorders of vagina: Secondary | ICD-10-CM

## 2019-09-15 MED ORDER — METRONIDAZOLE 500 MG PO TABS: 500.0000 mg | ORAL_TABLET | Freq: Two times a day (BID) | ORAL | 0 refills | Status: DC

## 2019-09-15 NOTE — Discharge Instructions (Signed)
Take the Flagyl as directed.  Do not have sex for the next 7 days.    Follow up with your primary care provider or come here to be seen in person if your symptoms are not improving.

## 2019-09-15 NOTE — ED Provider Notes (Signed)
Virtual Visit via Video Note:  Allison Black  initiated request for Telemedicine visit with [facility] Urgent Care team. I connected with Allison Black  on 09/15/2019 at 10:46 AM  for a synchronized telemedicine visit using a video enabled HIPPA compliant telemedicine application. I verified that I am speaking with Allison Black  using two identifiers. Mickie Bail, NP  was physically located in a [facility] Urgent care site and Vincent Ehrler was located at a different location.   The limitations of evaluation and management by telemedicine as well as the availability of in-person appointments were discussed. Patient was informed that she  may incur a bill ( including co-pay) for this virtual visit encounter. Allison Black  expressed understanding and gave verbal consent to proceed with virtual visit.     History of Present Illness:Allison Black  is a 24 y.o. female presents for evaluation of malodorous white vaginal discharge x 1 day.  She has a history of frequent BV.  She denies fever, chills, abdominal pain, dysuria, back pain, pelvic pain, lesions, rash, or other symptoms.  No treatment attempted at home.  She denies current pregnancy or breastfeeding.      No Known Allergies   Past Medical History:  Diagnosis Date  . Asthma   . Heart murmur   . Panic attack      Social History   Tobacco Use  . Smoking status: Current Every Day Smoker  . Smokeless tobacco: Never Used  Vaping Use  . Vaping Use: Never used  Substance Use Topics  . Alcohol use: No  . Drug use: Not Currently    Types: Marijuana    ROS: as stated in HPI.  All other systems reviewed and negative.      Observations/Objective: Physical Exam  VITALS: Patient denies fever. GENERAL: Alert, appears well and in no acute distress. HEENT: Atraumatic. Oral mucosa appears moist.  NECK: Normal movements of the head and neck. CARDIOPULMONARY: No increased WOB. Speaking in clear sentences. I:E ratio WNL.  MS:  Moves all visible extremities without noticeable abnormality. PSYCH: Pleasant and cooperative, well-groomed. Speech normal rate and rhythm. Affect is appropriate. Insight and judgement are appropriate. Attention is focused, linear, and appropriate.  NEURO: CN grossly intact. Oriented as arrived to appointment on time with no prompting. Moves both UE equally.  SKIN: No obvious lesions, wounds, erythema, or cyanosis noted on face or hands.   Assessment and Plan:    ICD-10-CM   1. Vaginal discharge  N89.8        Follow Up Instructions: Patient states her symptoms are similar to previous episodes of BV.  Treating with Flagyl.  Discussed with patient that she will need to be seen in person either by her PCP or here at the urgent care if her symptoms do not completely resolve with this medication as she may need a vaginal swab for testing.  Patient agrees to plan of care.      I discussed the assessment and treatment plan with the patient. The patient was provided an opportunity to ask questions and all were answered. The patient agreed with the plan and demonstrated an understanding of the instructions.   The patient was advised to call back or seek an in-person evaluation if the symptoms worsen or if the condition fails to improve as anticipated.      Mickie Bail, NP  09/15/2019 10:46 AM         Mickie Bail, NP 09/15/19 1046

## 2019-10-03 ENCOUNTER — Ambulatory Visit (INDEPENDENT_AMBULATORY_CARE_PROVIDER_SITE_OTHER)
Admission: RE | Admit: 2019-10-03 | Discharge: 2019-10-03 | Disposition: A | Discharge status: Home / Self Care | Payer: Medicaid Other | Source: Ambulatory Visit

## 2019-10-03 DIAGNOSIS — R300 Dysuria: Secondary | ICD-10-CM

## 2019-10-03 MED ORDER — CEPHALEXIN 500 MG PO CAPS: 500.0000 mg | ORAL_CAPSULE | Freq: Two times a day (BID) | ORAL | 0 refills | Status: AC

## 2019-10-03 MED ORDER — PHENAZOPYRIDINE HCL 200 MG PO TABS: 200.0000 mg | ORAL_TABLET | Freq: Three times a day (TID) | ORAL | 0 refills | Status: DC

## 2019-10-03 NOTE — Discharge Instructions (Signed)
Take the Keflex and Pyridium as directed.    Follow up with your primary care provider or come here to be seen in person if your symptoms are not improving.

## 2019-10-03 NOTE — ED Provider Notes (Signed)
Virtual Visit via Video Note:  Allison Black  initiated request for Telemedicine visit with [facility] Urgent Care team. I connected with [name]  on 10/03/2019 at 10:42 AM  for a synchronized telemedicine visit using a video enabled HIPPA compliant telemedicine application. I verified that I am speaking with [name]  using two identifiers. Mickie Bail, NP  was physically located in a [facility] Urgent care site and Lora Chavers was located at a different location.   The limitations of evaluation and management by telemedicine as well as the availability of in-person appointments were discussed. Patient was informed that she  may incur a bill ( including co-pay) for this virtual visit encounter. Allison Black  expressed understanding and gave verbal consent to proceed with virtual visit.     History of Present Illness:Allison Black  is a 24 y.o. female presents for evaluation of dysuria x 2 days.  She also reports urinary frequency and urgency.  She states her symptoms feel similar to previous UTIs.  She denies fever, chills, abdominal pain, back pain, vaginal discharge, pelvic pain, rash, lesions, or other symptoms.  Treatment attempted at home with increased water intake.  She denies current pregnancy or breastfeeding.      No Known Allergies   Past Medical History:  Diagnosis Date  . Asthma   . Heart murmur   . Panic attack      Social History   Tobacco Use  . Smoking status: Current Every Day Smoker  . Smokeless tobacco: Never Used  Vaping Use  . Vaping Use: Never used  Substance Use Topics  . Alcohol use: No  . Drug use: Not Currently    Types: Marijuana    ROS: as stated in HPI.  All other systems reviewed and negative.     Observations/Objective: Physical Exam  VITALS: Patient denies fever. GENERAL: Alert, appears well and in no acute distress. HEENT: Atraumatic. Oral mucosa appears moist. NECK: Normal movements of the head and  neck. CARDIOPULMONARY: No increased WOB. Speaking in clear sentences. I:E ratio WNL.  MS: Moves all visible extremities without noticeable abnormality. PSYCH: Pleasant and cooperative, well-groomed. Speech normal rate and rhythm. Affect is appropriate. Insight and judgement are appropriate. Attention is focused, linear, and appropriate.  NEURO: CN grossly intact. Oriented as arrived to appointment on time with no prompting. Moves both UE equally.  SKIN: No obvious lesions, wounds, erythema, or cyanosis noted on face or hands.   Assessment and Plan:    ICD-10-CM   1. Dysuria  R30.0        Follow Up Instructions: Treating with 5 day course of Keflex.  Also prescribed Pyridium per patient request.  Instructed patient to follow-up with her PCP or come here to be seen in person if her symptoms are not improving.  Patient agrees to plan of care.      I discussed the assessment and treatment plan with the patient. The patient was provided an opportunity to ask questions and all were answered. The patient agreed with the plan and demonstrated an understanding of the instructions.   The patient was advised to call back or seek an in-person evaluation if the symptoms worsen or if the condition fails to improve as anticipated.      Mickie Bail, NP  10/03/2019 10:42 AM         Mickie Bail, NP 10/03/19 1042

## 2019-10-05 ENCOUNTER — Ambulatory Visit
Admission: EM | Admit: 2019-10-05 | Discharge: 2019-10-05 | Disposition: A | Discharge status: Home / Self Care | Payer: Medicaid Other | Attending: Family Medicine | Admitting: Family Medicine

## 2019-10-05 ENCOUNTER — Encounter: Payer: Self-pay | Admitting: Emergency Medicine

## 2019-10-05 DIAGNOSIS — R350 Frequency of micturition: Secondary | ICD-10-CM | POA: Insufficient documentation

## 2019-10-05 DIAGNOSIS — R300 Dysuria: Secondary | ICD-10-CM | POA: Insufficient documentation

## 2019-10-05 DIAGNOSIS — N898 Other specified noninflammatory disorders of vagina: Secondary | ICD-10-CM | POA: Insufficient documentation

## 2019-10-05 LAB — POCT URINALYSIS DIP (MANUAL ENTRY)
Bilirubin, UA: NEGATIVE
Glucose, UA: NEGATIVE mg/dL
Ketones, POC UA: NEGATIVE mg/dL
Nitrite, UA: NEGATIVE
Protein Ur, POC: NEGATIVE mg/dL
Spec Grav, UA: >=1.03 — AB (ref 1.010–1.025)
Urobilinogen, UA: 0.2 E.U./dL
pH, UA: 5.5 (ref 5.0–8.0)

## 2019-10-05 LAB — POCT URINE PREGNANCY: Preg Test, Ur: NEGATIVE

## 2019-10-05 MED ORDER — FLUCONAZOLE 150 MG PO TABS: ORAL_TABLET | ORAL | 0 refills | Status: DC

## 2019-10-05 NOTE — Discharge Instructions (Signed)
Your swab tests are pending.  If your test results are positive, we will call you.  You may need additional treatment and your partner(s) may also need treatment.      You may have a urinary tract infection. We are going to culture your urine and will call you as soon as we have the results.   Drink plenty of water, 8-10 glasses per day.   You may take AZO over the counter for painful urination.  Follow up with your primary care provider as needed.   Go to the Emergency Department if you experience severe pain, shortness of breath, high fever, or other concerns.

## 2019-10-05 NOTE — ED Provider Notes (Signed)
[facility]   [id number] 10/05/19 Arrival Time: 1402   CC: VAGINAL DISCHARGE  SUBJECTIVE:  Allison Black is a 24 y.o. female who presents with complaints of dysuria, vaginal irritation for the last 2 days. Reports recent antibiotic use for probable UTI and BV. Patient is sexually active. She has not taken OTC medications for this. There are no aggravating or alleviating symptoms. Denies the same symptoms in the past. She denies fever, chills, nausea, vomiting, abdominal or pelvic pain, urinary symptoms, vaginal itching, vaginal odor, vaginal bleeding, dyspareunia, vaginal rashes or lesions.   Patient's last menstrual period was 09/02/2019.   ROS: As per HPI.  All other pertinent ROS negative.     Past Medical History:  Diagnosis Date  . Asthma   . Heart murmur   . Panic attack    History reviewed. No pertinent surgical history. No Known Allergies No current facility-administered medications on file prior to encounter.   Current Outpatient Medications on File Prior to Encounter  Medication Sig Dispense Refill  . cephALEXin (KEFLEX) 500 MG capsule Take 1 capsule (500 mg total) by mouth 2 (two) times daily for 5 days. 10 capsule 0  . metroNIDAZOLE (FLAGYL) 500 MG tablet Take 1 tablet (500 mg total) by mouth 2 (two) times daily. 14 tablet 0  . phenazopyridine (PYRIDIUM) 200 MG tablet Take 1 tablet (200 mg total) by mouth 3 (three) times daily. 6 tablet 0    Social History   Socioeconomic History  . Marital status: Married    Spouse name: [name]  . Number of children: 2  . Years of education: Not on file  . Highest education level: Not on file  Occupational History  . Not on file  Tobacco Use  . Smoking status: Current Every Day Smoker  . Smokeless tobacco: Never Used  Vaping Use  . Vaping Use: Never used  Substance and Sexual Activity  . Alcohol use: No  . Drug use: Not Currently    Types: Marijuana  . Sexual activity: Yes    Birth control/protection: None   Other Topics Concern  . Not on file  Social History Narrative   Son - [name]    Daughter - [name]   Social Determinants of Health   Financial Resource Strain:   . Difficulty of Paying Living Expenses:   Food Insecurity:   . Worried About Programme researcher, broadcasting/film/video in the Last Year:   . Barista in the Last Year:   Transportation Needs:   . Freight forwarder (Medical):   Marland Kitchen Lack of Transportation (Non-Medical):   Physical Activity:   . Days of Exercise per Week:   . Minutes of Exercise per Session:   Stress:   . Feeling of Stress :   Social Connections:   . Frequency of Communication with Friends and Family:   . Frequency of Social Gatherings with Friends and Family:   . Attends Religious Services:   . Active Member of Clubs or Organizations:   . Attends Banker Meetings:   Marland Kitchen Marital Status:   Intimate Partner Violence:   . Fear of Current or Ex-Partner:   . Emotionally Abused:   Marland Kitchen Physically Abused:   . Sexually Abused:    Family History  Problem Relation Age of Onset  . Diabetes Maternal Grandmother   . Diabetes Cousin     OBJECTIVE:  Vitals:   10/05/19 1407  BP: 101/70  Pulse: 94  Resp: 14  Temp: 99 F (37.2 C)  TempSrc: Oral  SpO2: 97%     General appearance: Alert, NAD, appears stated age Head: NCAT Throat: lips, mucosa, and tongue normal; teeth and gums normal Lungs: CTA bilaterally without adventitious breath sounds Heart: regular rate and rhythm.  Radial pulses 2+ symmetrical bilaterally Back: no CVA tenderness Abdomen: soft, non-tender; bowel sounds normal; no masses or organomegaly; no guarding or rebound tenderness GU: External examination without vulvar lesions, mild erythema present Skin: warm and dry Psychological:  Alert and cooperative. Normal mood and affect.  LABS:  Results for orders placed or performed during the hospital encounter of 10/05/19  POCT urine pregnancy  Result Value Ref Range   Preg Test, Ur  Negative Negative  POCT urinalysis dipstick  Result Value Ref Range   Color, UA yellow yellow   Clarity, UA clear clear   Glucose, UA negative negative mg/dL   Bilirubin, UA negative negative   Ketones, POC UA negative negative mg/dL   Spec Grav, UA >=8.676 (A) 1.010 - 1.025   Blood, UA small (A) negative   pH, UA 5.5 5.0 - 8.0   Protein Ur, POC negative negative mg/dL   Urobilinogen, UA 0.2 0.2 or 1.0 E.U./dL   Nitrite, UA Negative Negative   Leukocytes, UA Small (1+) (A) Negative    Labs Reviewed  POCT URINALYSIS DIP (MANUAL ENTRY) - Abnormal; Notable for the following components:      Result Value   Spec Grav, UA >=1.030 (*)    Blood, UA small (*)    Leukocytes, UA Small (1+) (*)    All other components within normal limits  URINE CULTURE  POCT URINE PREGNANCY  CERVICOVAGINAL ANCILLARY ONLY    ASSESSMENT & PLAN:  1. Urinary frequency   2. Dysuria   3. Vaginal irritation     Meds ordered this encounter  Medications  . fluconazole (DIFLUCAN) 150 MG tablet    Sig: Take one tablet at the onset of symptoms, if still having symptoms in 3 days, take the second tablet.    Dispense:  2 tablet    Refill:  0    Order Specific Question:   Supervising Provider    Answer:   Merrilee Jansky [[id number]]    Pending: Labs Reviewed  POCT URINALYSIS DIP (MANUAL ENTRY) - Abnormal; Notable for the following components:      Result Value   Spec Grav, UA >=1.030 (*)    Blood, UA small (*)    Leukocytes, UA Small (1+) (*)    All other components within normal limits  URINE CULTURE  POCT URINE PREGNANCY  CERVICOVAGINAL ANCILLARY ONLY   UA negative for infection Will culture and call with positive results Vaginal self-swab obtained.  We will follow up with you regarding abnormal results HIV/ syphilis testing today Prescribed diflucan 150 mg once daily and then second dose 72 hours later Take medications as prescribed and to completion If tests results are positive, please  abstain from sexual activity until you and your partner(s) have been treated Follow up with PCP or [facility] if symptoms persists Return here or go to ER if you have any new or worsening symptoms fever, chills, nausea, vomiting, abdominal or pelvic pain, painful intercourse, vaginal discharge, vaginal bleeding, persistent symptoms despite treatment Reviewed expectations re: course of current medical issues. Questions answered. Outlined signs and symptoms indicating need for more acute intervention. Patient verbalized understanding. After Visit Summary given.        Moshe Cipro, NP 10/05/19 1514

## 2019-10-05 NOTE — ED Triage Notes (Signed)
Pt c/o uti like symptoms x 2 days. She states she gets frequent UTIs but this one has a lot of vginal irritation. Pt denies discharge, she states her vagina feels very dry and sometimes itchy. She had frequent urination the first day but it has subsided.

## 2019-10-06 LAB — CERVICOVAGINAL ANCILLARY ONLY
Bacterial Vaginitis (gardnerella): NEGATIVE
Candida Glabrata: NEGATIVE
Candida Vaginitis: POSITIVE — AB
Chlamydia: NEGATIVE
Comment: NEGATIVE
Comment: NEGATIVE
Comment: NEGATIVE
Comment: NEGATIVE
Comment: NEGATIVE
Comment: NORMAL
Neisseria Gonorrhea: NEGATIVE
Trichomonas: NEGATIVE

## 2019-10-07 ENCOUNTER — Ambulatory Visit: Payer: Medicaid Other | Admitting: Advanced Practice Midwife

## 2019-10-08 LAB — URINE CULTURE: Culture: >=100000 — AB

## 2019-10-10 ENCOUNTER — Telehealth (HOSPITAL_COMMUNITY): Payer: Self-pay

## 2019-10-10 MED ORDER — NITROFURANTOIN MONOHYD MACRO 100 MG PO CAPS: 100.0000 mg | ORAL_CAPSULE | Freq: Two times a day (BID) | ORAL | 0 refills | Status: DC

## 2019-10-20 ENCOUNTER — Encounter: Payer: Self-pay | Admitting: Emergency Medicine

## 2019-10-20 ENCOUNTER — Other Ambulatory Visit: Payer: Self-pay

## 2019-10-20 ENCOUNTER — Ambulatory Visit
Admission: EM | Admit: 2019-10-20 | Discharge: 2019-10-20 | Disposition: A | Discharge status: Home / Self Care | Payer: Medicaid Other | Attending: Family Medicine | Admitting: Family Medicine

## 2019-10-20 DIAGNOSIS — Z202 Contact with and (suspected) exposure to infections with a predominantly sexual mode of transmission: Secondary | ICD-10-CM | POA: Insufficient documentation

## 2019-10-20 LAB — CHLAMYDIA/NGC RT PCR (ARMC ONLY)
Chlamydia Tr: NOT DETECTED
N gonorrhoeae: NOT DETECTED

## 2019-10-20 MED ORDER — DOXYCYCLINE HYCLATE 100 MG PO CAPS: 100.0000 mg | ORAL_CAPSULE | Freq: Two times a day (BID) | ORAL | 0 refills | Status: DC

## 2019-10-20 MED ORDER — CEFTRIAXONE SODIUM 500 MG IJ SOLR
500.0000 mg | Freq: Once | INTRAMUSCULAR | Status: AC
  Administered 2019-10-20: 500 mg via INTRAMUSCULAR

## 2019-10-20 NOTE — ED Triage Notes (Signed)
Patient here for STD testing. Her husband was tested yesterday but has not gotten the results back yet. She is wanting to be tested for chlamydia and gonorrhea.

## 2019-10-20 NOTE — ED Provider Notes (Signed)
[facility]    CSN: [id number] Arrival date & time: 10/20/19  1945  History   Chief Complaint Chief Complaint  Patient presents with  . Exposure to STD   HPI   24 year old female presents with the above complaint.  Patient states that her husband has informed her of infidelity.  He is currently having dysuria and has been tested for STDs.  His testing is not back.  Patient states that she is concerned and wanted to get tested today.  Patient states that she is having some vaginal discharge but often has bacterial vaginosis.  She is having no other symptoms.  She would like STD testing today.  No other complaints concerns at this time.  Past Medical History:  Diagnosis Date  . Asthma   . Heart murmur   . Panic attack     Patient Active Problem List   Diagnosis Date Noted  . Asthma 02/10/2017  . Need for immunization against influenza 01/27/2017  . Episodic lightheadedness 12/19/2016  . Depression with anxiety 10/09/2016    OB History    Gravida  3   Para  2   Term  2   Preterm      AB  1   Living  2     SAB  1   TAB      Ectopic      Multiple  0   Live Births  2            Home Medications    Prior to Admission medications   Medication Sig Start Date End Date Taking? Authorizing Provider  doxycycline (VIBRAMYCIN) 100 MG capsule Take 1 capsule (100 mg total) by mouth 2 (two) times daily. 10/20/19   Tommie Sams, DO    Family History Family History  Problem Relation Age of Onset  . Diabetes Maternal Grandmother   . Diabetes Cousin     Social History Social History   Tobacco Use  . Smoking status: Former Games developer  . Smokeless tobacco: Never Used  Vaping Use  . Vaping Use: Never used  Substance Use Topics  . Alcohol use: No  . Drug use: Yes    Types: Marijuana    Comment: last used 10/20/19     Allergies   Patient has no known allergies.   Review of Systems Review of Systems  Constitutional: Negative.     Genitourinary: Positive for vaginal discharge.   Physical Exam Triage Vital Signs ED Triage Vitals  Enc Vitals Group     BP 10/20/19 1954 122/82     Pulse Rate 10/20/19 1954 100     Resp 10/20/19 1954 18     Temp 10/20/19 1954 (!) 97.5 F (36.4 C)     Temp Source 10/20/19 1954 Oral     SpO2 10/20/19 1954 98 %     Weight 10/20/19 1953 128 lb 1.4 oz (58.1 kg)     Height 10/20/19 1953 5\' 2"  (1.575 m)     Head Circumference --      Peak Flow --      Pain Score 10/20/19 1953 0     Pain Loc --      Pain Edu? --      Excl. in GC? --    Updated Vital Signs BP 122/82 (BP Location: Right Arm)   Pulse 100   Temp (!) 97.5 F (36.4 C) (Oral)   Resp 18   Ht 5\' 2"  (1.575 m)   Wt 58.1 kg  LMP 10/14/2019   SpO2 98%   BMI 23.43 kg/m   Visual Acuity Right Eye Distance:   Left Eye Distance:   Bilateral Distance:    Right Eye Near:   Left Eye Near:    Bilateral Near:     Physical Exam Vitals and nursing note reviewed.  Constitutional:      General: She is not in acute distress.    Appearance: Normal appearance. She is not ill-appearing.  Eyes:     General:        Right eye: No discharge.        Left eye: No discharge.     Conjunctiva/sclera: Conjunctivae normal.  Cardiovascular:     Rate and Rhythm: Normal rate and regular rhythm.  Pulmonary:     Effort: Pulmonary effort is normal.     Breath sounds: Normal breath sounds.  Abdominal:     General: There is no distension.     Palpations: Abdomen is soft.     Tenderness: There is no abdominal tenderness.  Neurological:     Mental Status: She is alert.  Psychiatric:        Mood and Affect: Mood normal.        Behavior: Behavior normal.    UC Treatments / Results  Labs (all labs ordered are listed, but only abnormal results are displayed) Labs Reviewed  CHLAMYDIA/NGC RT PCR [facility] ONLY)    EKG   Radiology No results found.  Procedures Procedures (including critical care time)  Medications  Ordered in UC Medications  cefTRIAXone (ROCEPHIN) injection 500 mg (500 mg Intramuscular Given 10/20/19 2013)    Initial Impression / Assessment and Plan / UC Course  I have reviewed the triage vital signs and the nursing notes.  Pertinent labs & imaging results that were available during my care of the patient were reviewed by me and considered in my medical decision making (see chart for details).    24 year old female presents with concern for STD.  Patient desires empiric treatment.  Rocephin given today.  Sending in doxycycline.  Will call with positive test results.  Final Clinical Impressions(s) / UC Diagnoses   Final diagnoses:  Possible exposure to STD     Discharge Instructions     Results should be back tomorrow.   Check mychart. We will call with positive results.  Take care  Dr. Adriana Simas    ED Prescriptions    Medication Sig Dispense Auth. Provider   doxycycline (VIBRAMYCIN) 100 MG capsule Take 1 capsule (100 mg total) by mouth 2 (two) times daily. 14 capsule Everlene Other G, DO     PDMP not reviewed this encounter.   Tommie Sams, [place] 10/20/19 2028

## 2019-10-20 NOTE — Discharge Instructions (Signed)
Results should be back tomorrow.   Check mychart. We will call with positive results.  Take care  Dr. Adriana Simas

## 2019-11-24 ENCOUNTER — Ambulatory Visit
Admission: EM | Admit: 2019-11-24 | Discharge: 2019-11-24 | Disposition: A | Discharge status: Home / Self Care | Payer: Self-pay | Attending: Emergency Medicine | Admitting: Emergency Medicine

## 2019-11-24 ENCOUNTER — Other Ambulatory Visit: Payer: Self-pay

## 2019-11-24 DIAGNOSIS — R110 Nausea: Secondary | ICD-10-CM

## 2019-11-24 DIAGNOSIS — Z0189 Encounter for other specified special examinations: Secondary | ICD-10-CM

## 2019-11-24 MED ORDER — ONDANSETRON HCL 4 MG PO TABS: 4.0000 mg | ORAL_TABLET | Freq: Four times a day (QID) | ORAL | 0 refills | Status: DC | PRN

## 2019-11-24 NOTE — ED Provider Notes (Signed)
[facility]    CSN: [id number] Arrival date & time: 11/24/19  1339      History   Chief Complaint Chief Complaint  Patient presents with  . Nausea    HPI Allison Black is a 24 y.o. female.   Patient presents with 2-week history of nausea, abdominal "tightness" and decreased appetite.  No emesis.  She reports diarrhea after eating but states this is her baseline.  She denies fever, chills, dysuria, back pain, rash, or other symptoms.  No treatments attempted at home.  The history is provided by the patient.    Past Medical History:  Diagnosis Date  . Asthma   . Heart murmur   . Panic attack     Patient Active Problem List   Diagnosis Date Noted  . Asthma 02/10/2017  . Need for immunization against influenza 01/27/2017  . Episodic lightheadedness 12/19/2016  . Depression with anxiety 10/09/2016    History reviewed. No pertinent surgical history.  OB History    Gravida  3   Para  2   Term  2   Preterm      AB  1   Living  2     SAB  1   TAB      Ectopic      Multiple  0   Live Births  2            Home Medications    Prior to Admission medications   Medication Sig Start Date End Date Taking? Authorizing Provider  doxycycline (VIBRAMYCIN) 100 MG capsule Take 1 capsule (100 mg total) by mouth 2 (two) times daily. 10/20/19   Tommie Sams, DO  ondansetron (ZOFRAN) 4 MG tablet Take 1 tablet (4 mg total) by mouth every 6 (six) hours as needed for nausea or vomiting. 11/24/19   Mickie Bail, NP    Family History Family History  Problem Relation Age of Onset  . Diabetes Maternal Grandmother   . Diabetes Cousin     Social History Social History   Tobacco Use  . Smoking status: Former Games developer  . Smokeless tobacco: Never Used  Vaping Use  . Vaping Use: Never used  Substance Use Topics  . Alcohol use: No  . Drug use: Yes    Types: Marijuana    Comment: last used 10/20/19     Allergies   Patient has no known  allergies.   Review of Systems Review of Systems  Constitutional: Positive for appetite change. Negative for chills and fever.  HENT: Negative for ear pain and sore throat.   Eyes: Negative for pain and visual disturbance.  Respiratory: Negative for cough and shortness of breath.   Cardiovascular: Negative for chest pain and palpitations.  Gastrointestinal: Positive for abdominal pain, diarrhea and nausea. Negative for vomiting.  Genitourinary: Negative for dysuria and hematuria.  Musculoskeletal: Negative for arthralgias and back pain.  Skin: Negative for color change and rash.  Neurological: Negative for seizures and syncope.  All other systems reviewed and are negative.    Physical Exam Triage Vital Signs ED Triage Vitals  Enc Vitals Group     BP 11/24/19 1354 108/72     Pulse Rate 11/24/19 1354 65     Resp 11/24/19 1354 18     Temp 11/24/19 1354 98.5 F (36.9 C)     Temp Source 11/24/19 1354 Oral     SpO2 11/24/19 1354 98 %     Weight 11/24/19 1355 120 lb (54.4 kg)  Height 11/24/19 1355 5\' 2"  (1.575 m)     Head Circumference --      Peak Flow --      Pain Score 11/24/19 1354 4     Pain Loc --      Pain Edu? --      Excl. in GC? --    No data found.  Updated Vital Signs BP 108/72 (BP Location: Left Arm)   Pulse 65   Temp 98.5 F (36.9 C) (Oral)   Resp 18   Ht 5\' 2"  (1.575 m)   Wt 120 lb (54.4 kg)   LMP 11/10/2019   SpO2 98%   BMI 21.95 kg/m   Visual Acuity Right Eye Distance:   Left Eye Distance:   Bilateral Distance:    Right Eye Near:   Left Eye Near:    Bilateral Near:     Physical Exam Vitals and nursing note reviewed.  Constitutional:      General: She is not in acute distress.    Appearance: Normal appearance. She is well-developed. She is not ill-appearing.  HENT:     Head: Normocephalic and atraumatic.     Mouth/Throat:     Mouth: Mucous membranes are moist.     Pharynx: Oropharynx is clear.  Eyes:     Conjunctiva/sclera:  Conjunctivae normal.  Cardiovascular:     Rate and Rhythm: Normal rate and regular rhythm.     Heart sounds: No murmur heard.   Pulmonary:     Effort: Pulmonary effort is normal. No respiratory distress.     Breath sounds: Normal breath sounds.  Abdominal:     General: Bowel sounds are normal.     Palpations: Abdomen is soft.     Tenderness: There is no abdominal tenderness. There is no right CVA tenderness, left CVA tenderness, guarding or rebound.  Musculoskeletal:     Cervical back: Neck supple.  Skin:    General: Skin is warm and dry.     Findings: No rash.  Neurological:     General: No focal deficit present.     Mental Status: She is alert and oriented to person, place, and time.     Gait: Gait normal.  Psychiatric:        Mood and Affect: Mood normal.        Behavior: Behavior normal.      UC Treatments / Results  Labs (all labs ordered are listed, but only abnormal results are displayed) Labs Reviewed  NOVEL CORONAVIRUS, NAA    EKG   Radiology No results found.  Procedures Procedures (including critical care time)  Medications Ordered in UC Medications - No data to display  Initial Impression / Assessment and Plan / UC Course  I have reviewed the triage vital signs and the nursing notes.  Pertinent labs & imaging results that were available during my care of the patient were reviewed by me and considered in my medical decision making (see chart for details).   Nausea without vomiting.  Treating with Zofran.  Instructed patient to stay hydrated with clear liquids.  PCR COVID pending.  Instructed patient to self quarantine until the test result is back.  Instructed her to go to the ED if she has acute worsening symptoms and to follow-up with her PCP if her symptoms or not improving.  Patient agrees to plan of care.     Final Clinical Impressions(s) / UC Diagnoses   Final diagnoses:  Nausea without vomiting  Patient request for diagnostic testing  Discharge Instructions     Take the antinausea medication as directed.    Keep yourself hydrated with clear liquids, such as water, Gatorade, Pedialyte, Sprite, or ginger ale.    Go to the emergency department if you have acute worsening symptoms.    Follow up with your primary care provider if your symptoms are not improving.    Your COVID test is pending.  You should self quarantine until the test result is back.        ED Prescriptions    Medication Sig Dispense Auth. Provider   ondansetron (ZOFRAN) 4 MG tablet Take 1 tablet (4 mg total) by mouth every 6 (six) hours as needed for nausea or vomiting. 12 tablet Mickie Bail, NP     PDMP not reviewed this encounter.   Mickie Bail, NP 11/24/19 1429

## 2019-11-24 NOTE — Discharge Instructions (Signed)
Take the antinausea medication as directed.    Keep yourself hydrated with clear liquids, such as water, Gatorade, Pedialyte, Sprite, or ginger ale.    Go to the emergency department if you have acute worsening symptoms.    Follow up with your primary care provider if your symptoms are not improving.    Your COVID test is pending.  You should self quarantine until the test result is back.      

## 2019-11-24 NOTE — ED Triage Notes (Signed)
Patient states that over the last 2 weeks she has been having nausea but hasn't vomited. States that sometimes she has a tightness in her abdomen and doesn't have an appetite. States that after she does eat she goes to the bathroom immediately after.

## 2019-11-26 LAB — SARS-COV-2, NAA 2 DAY TAT

## 2019-11-26 LAB — NOVEL CORONAVIRUS, NAA: SARS-CoV-2, NAA: NOT DETECTED

## 2019-12-18 ENCOUNTER — Ambulatory Visit
Admission: EM | Admit: 2019-12-18 | Discharge: 2019-12-18 | Disposition: A | Discharge status: Home / Self Care | Payer: Self-pay | Attending: Emergency Medicine | Admitting: Emergency Medicine

## 2019-12-18 ENCOUNTER — Other Ambulatory Visit: Payer: Self-pay

## 2019-12-18 ENCOUNTER — Encounter: Payer: Self-pay | Admitting: Emergency Medicine

## 2019-12-18 DIAGNOSIS — B373 Candidiasis of vulva and vagina: Secondary | ICD-10-CM

## 2019-12-18 DIAGNOSIS — N3001 Acute cystitis with hematuria: Secondary | ICD-10-CM

## 2019-12-18 DIAGNOSIS — B3731 Acute candidiasis of vulva and vagina: Secondary | ICD-10-CM

## 2019-12-18 LAB — URINALYSIS, COMPLETE (UACMP) WITH MICROSCOPIC
Bilirubin Urine: NEGATIVE
Glucose, UA: NEGATIVE mg/dL
Ketones, ur: NEGATIVE mg/dL
Nitrite: POSITIVE — AB
Protein, ur: 100 mg/dL — AB
RBC / HPF: >50 RBC/hpf (ref 0–5)
Specific Gravity, Urine: >1.03 — ABNORMAL HIGH (ref 1.005–1.030)
WBC, UA: >50 WBC/hpf (ref 0–5)
pH: 6 (ref 5.0–8.0)

## 2019-12-18 LAB — WET PREP, GENITAL
Clue Cells Wet Prep HPF POC: NONE SEEN
Sperm: NONE SEEN
Trich, Wet Prep: NONE SEEN

## 2019-12-18 MED ORDER — PHENAZOPYRIDINE HCL 200 MG PO TABS: 200.0000 mg | ORAL_TABLET | Freq: Three times a day (TID) | ORAL | 0 refills | Status: DC | PRN

## 2019-12-18 MED ORDER — MICONAZOLE NITRATE 2 % EX CREA: 1.0000 "application " | TOPICAL_CREAM | Freq: Two times a day (BID) | CUTANEOUS | 0 refills | Status: DC

## 2019-12-18 MED ORDER — NITROFURANTOIN MONOHYD MACRO 100 MG PO CAPS: 100.0000 mg | ORAL_CAPSULE | Freq: Two times a day (BID) | ORAL | 0 refills | Status: DC

## 2019-12-18 MED ORDER — ONDANSETRON 8 MG PO TBDP: ORAL_TABLET | ORAL | 0 refills | Status: DC

## 2019-12-18 MED ORDER — FLUCONAZOLE 150 MG PO TABS: 150.0000 mg | ORAL_TABLET | Freq: Once | ORAL | 1 refills | Status: AC

## 2019-12-18 NOTE — ED Triage Notes (Signed)
Patient c/o vaginal irritation and burning when urinating for the past 2 days.  Patient denies vaginal discharged.  Patient states that she just finished her menstrual period.

## 2019-12-18 NOTE — Discharge Instructions (Addendum)
I have sent your urine off for culture to make sure you are on the correct antibiotic.  Finish the [employer], even feel better.  Pyridium as needed for symptoms.  You can try the miconazole for the vaginal irritation.

## 2019-12-18 NOTE — ED Provider Notes (Signed)
HPI  SUBJECTIVE:  Allison Black is a 24 y.o. female who presents with dysuria, urgency, frequency, hematuria, vaginal irritation and itching starting 2 days ago.  States that she finished menses 2 days ago.  No cloudy or odorous urine, no genital rash, swelling, odor, abnormal discharge.  She is in a long-term monogamous relationship with a female who is asymptomatic, STDs are not a concern today.  No recent antibiotic.  No antipyretic in the past 6 hours.  No new soaps or perfumed body washes.  She tried Pyridium without improvement in her symptoms.  Symptoms worse with walking.  She has a past medical history of BV and yeast, UTI, nephrolithiasis.  No history of gonorrhea, chlamydia, HIV, HSV, syphilis, trichomonas, pyelonephritis, diabetes.  LMP: Last week.  Denies the possibility of being pregnant.  PMD: [facility].   Past Medical History:  Diagnosis Date  . Asthma   . Heart murmur   . Panic attack     History reviewed. No pertinent surgical history.  Family History  Problem Relation Age of Onset  . Diabetes Maternal Grandmother   . Diabetes Cousin     Social History   Tobacco Use  . Smoking status: Former Games developer  . Smokeless tobacco: Never Used  Vaping Use  . Vaping Use: Never used  Substance Use Topics  . Alcohol use: No  . Drug use: Yes    Types: Marijuana    Comment: last used 10/20/19    No current facility-administered medications for this encounter.  Current Outpatient Medications:  .  doxycycline (VIBRAMYCIN) 100 MG capsule, Take 1 capsule (100 mg total) by mouth 2 (two) times daily., Disp: 14 capsule, Rfl: 0 .  ondansetron (ZOFRAN) 4 MG tablet, Take 1 tablet (4 mg total) by mouth every 6 (six) hours as needed for nausea or vomiting., Disp: 12 tablet, Rfl: 0  No Known Allergies   ROS  As noted in HPI.   Physical Exam  BP 106/73 (BP Location: Left Arm)   Pulse 75   Temp 97.8 F (36.6 C) (Oral)   Resp 14   Ht 5\' 2"  (1.575 m)   Wt 54.4 kg    LMP 12/08/2019 (Approximate)   SpO2 100%   BMI 21.95 kg/m   Constitutional: Well developed, well nourished, no acute distress Eyes:  EOMI, conjunctiva normal bilaterally HENT: Normocephalic, atraumatic,mucus membranes moist Respiratory: Normal inspiratory effort Cardiovascular: Normal rate GI: nondistended.  No suprapubic, flank tenderness Back: No CVAT skin: No rash, skin intact Musculoskeletal: no deformities Neurologic: Alert & oriented x 3, no focal neuro deficits Psychiatric: Speech and behavior appropriate   ED Course   Medications - No data to display  Orders Placed This Encounter  Procedures  . Wet prep, genital    Standing Status:   Standing    Number of Occurrences:   1  . Urine culture    Standing Status:   Standing    Number of Occurrences:   1    Order Specific Question:   List patient's active antibiotics    Answer:   macrobid  . Urinalysis, Complete w Microscopic    Standing Status:   Standing    Number of Occurrences:   1    Results for orders placed or performed during the hospital encounter of 12/18/19 (from the past 24 hour(s))  Urinalysis, Complete w Microscopic Urine, Clean Catch     Status: Abnormal   Collection Time: 12/18/19  9:24 AM  Result Value Ref Range   Color,  Urine AMBER (A) YELLOW   APPearance CLOUDY (A) CLEAR   Specific Gravity, Urine >1.030 (H) 1.005 - 1.030   pH 6.0 5.0 - 8.0   Glucose, UA NEGATIVE NEGATIVE mg/dL   Hgb urine dipstick LARGE (A) NEGATIVE   Bilirubin Urine NEGATIVE NEGATIVE   Ketones, ur NEGATIVE NEGATIVE mg/dL   Protein, ur 683 (A) NEGATIVE mg/dL   Nitrite POSITIVE (A) NEGATIVE   Leukocytes,Ua SMALL (A) NEGATIVE   Squamous Epithelial / LPF 0-5 0 - 5   WBC, UA >50 0 - 5 WBC/hpf   RBC / HPF >50 0 - 5 RBC/hpf   Bacteria, UA MANY (A) NONE SEEN  Wet prep, genital     Status: Abnormal   Collection Time: 12/18/19  9:24 AM   Specimen: Vaginal  Result Value Ref Range   Yeast Wet Prep HPF POC PRESENT (A) NONE SEEN    Trich, Wet Prep NONE SEEN NONE SEEN   Clue Cells Wet Prep HPF POC NONE SEEN NONE SEEN   WBC, Wet Prep HPF POC FEW (A) NONE SEEN   Sperm NONE SEEN    No results found.  ED Clinical Impression  No diagnosis found.   ED Assessment/Plan  1.  Presentation consistent with UTI.  Sending home with Macrobid, Pyridium.  Sending urine off for culture to confirm antibiotic choice.  2.  Wet prep positive for yeast.  Diflucan x2 and external miconazole cream  3.  Medication refill-patient states that she frequently has nausea and is requesting a refill on Zofran.  Discussed labs,MDM, treatment plan, and plan for follow-up with patient. Discussed sn/sx that should prompt return to the ED. patient agrees with plan.   No orders of the defined types were placed in this encounter.   *This clinic note was created using Dragon dictation software. Therefore, there may be occasional mistakes despite careful proofreading.   ?    Domenick Gong, MD 12/19/19 1044

## 2019-12-20 LAB — URINE CULTURE: Culture: >=100000 — AB

## 2019-12-28 ENCOUNTER — Telehealth: Payer: Self-pay | Admitting: Obstetrics & Gynecology

## 2019-12-28 NOTE — Telephone Encounter (Signed)
Patient is calling need her last Tdap printed out. Patient has been advised to fill out release of information form. Patient is needing this printed out today for an appointment she is being seen for tomorrow. Could you help with this?

## 2019-12-28 NOTE — Telephone Encounter (Signed)
TDAP info printed and put up front for pt p/u.

## 2020-02-03 ENCOUNTER — Emergency Department: Payer: No Typology Code available for payment source

## 2020-02-03 ENCOUNTER — Other Ambulatory Visit: Payer: Self-pay

## 2020-02-03 ENCOUNTER — Emergency Department
Admission: EM | Admit: 2020-02-03 | Discharge: 2020-02-04 | Disposition: A | Discharge status: Home / Self Care | Payer: No Typology Code available for payment source | Attending: Emergency Medicine | Admitting: Emergency Medicine

## 2020-02-03 DIAGNOSIS — J45909 Unspecified asthma, uncomplicated: Secondary | ICD-10-CM | POA: Insufficient documentation

## 2020-02-03 DIAGNOSIS — Z87891 Personal history of nicotine dependence: Secondary | ICD-10-CM | POA: Insufficient documentation

## 2020-02-03 DIAGNOSIS — M79641 Pain in right hand: Secondary | ICD-10-CM | POA: Insufficient documentation
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
## 2020-02-04 NOTE — ED Notes (Signed)
[employer] bedside. Pt read implied consent rights.
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
## 2020-02-04 NOTE — Discharge Instructions (Signed)
Please use ibuprofen or Tylenol for any continued pain. 

## 2020-05-11 ENCOUNTER — Other Ambulatory Visit (HOSPITAL_COMMUNITY)
Admission: RE | Admit: 2020-05-11 | Discharge: 2020-05-11 | Disposition: A | Discharge status: Home / Self Care | Payer: 59 | Source: Ambulatory Visit | Attending: Obstetrics & Gynecology | Admitting: Obstetrics & Gynecology

## 2020-05-11 ENCOUNTER — Ambulatory Visit (INDEPENDENT_AMBULATORY_CARE_PROVIDER_SITE_OTHER): Payer: 59 | Admitting: Obstetrics & Gynecology

## 2020-05-11 ENCOUNTER — Other Ambulatory Visit: Payer: Self-pay

## 2020-05-11 ENCOUNTER — Encounter: Payer: Self-pay | Admitting: Obstetrics & Gynecology

## 2020-05-11 VITALS — BP 120/80 | Ht 62.0 in | Wt 124.0 lb

## 2020-05-11 DIAGNOSIS — N760 Acute vaginitis: Secondary | ICD-10-CM | POA: Insufficient documentation

## 2020-05-11 DIAGNOSIS — Z113 Encounter for screening for infections with a predominantly sexual mode of transmission: Secondary | ICD-10-CM | POA: Diagnosis not present

## 2020-05-11 DIAGNOSIS — Z01419 Encounter for gynecological examination (general) (routine) without abnormal findings: Secondary | ICD-10-CM | POA: Diagnosis not present

## 2020-05-11 DIAGNOSIS — R87610 Atypical squamous cells of undetermined significance on cytologic smear of cervix (ASC-US): Secondary | ICD-10-CM | POA: Diagnosis not present

## 2020-05-11 DIAGNOSIS — Z124 Encounter for screening for malignant neoplasm of cervix: Secondary | ICD-10-CM | POA: Insufficient documentation

## 2020-05-11 DIAGNOSIS — N3001 Acute cystitis with hematuria: Secondary | ICD-10-CM

## 2020-05-11 DIAGNOSIS — B9689 Other specified bacterial agents as the cause of diseases classified elsewhere: Secondary | ICD-10-CM | POA: Diagnosis not present

## 2020-05-11 DIAGNOSIS — N898 Other specified noninflammatory disorders of vagina: Secondary | ICD-10-CM | POA: Diagnosis not present

## 2020-05-11 DIAGNOSIS — B373 Candidiasis of vulva and vagina: Secondary | ICD-10-CM | POA: Insufficient documentation

## 2020-05-11 DIAGNOSIS — F419 Anxiety disorder, unspecified: Secondary | ICD-10-CM

## 2020-05-11 MED ORDER — SULFAMETHOXAZOLE-TRIMETHOPRIM 800-160 MG PO TABS
1.0000 | ORAL_TABLET | Freq: Two times a day (BID) | ORAL | 0 refills | Status: AC
Start: 2020-05-11 — End: 2020-05-14

## 2020-05-11 NOTE — Progress Notes (Signed)
HPI:      [name] is a 25 y.o. [id number] who LMP was Patient's last menstrual period was 04/19/2020., she presents today for her annual examination. The patient has no complaints today other than anxiety and stress related to family status (lives w ex and wants to move apart hen she can afford to) with their 2 children;  Also reports vaginal irritation and UTI-like sx's.  The patient is sexually active. Her last pap: approximate date 2020 and was normal. The patient does perform self breast exams.  There is no notable family history of breast or ovarian cancer in her family.  The patient has regular exercise: yes.  The patient denies current symptoms of depression.    GYN History: Contraception: none except condoms    Prior SE to OCPs and Nexplanon  PMHx: Past Medical History:  Diagnosis Date  . Asthma   . Heart murmur   . Panic attack    History reviewed. No pertinent surgical history. Family History  Problem Relation Age of Onset  . Diabetes Maternal Grandmother   . Diabetes Cousin    Social History   Tobacco Use  . Smoking status: Former Games developer  . Smokeless tobacco: Never Used  Vaping Use  . Vaping Use: Never used  Substance Use Topics  . Alcohol use: Yes  . Drug use: Not Currently    Types: Marijuana    Comment: last used 10/20/19    Current Outpatient Medications:  .  sulfamethoxazole-trimethoprim (BACTRIM DS) 800-160 MG tablet, Take 1 tablet by mouth 2 (two) times daily for 3 days., Disp: 6 tablet, Rfl: 0 Allergies: Patient has no known allergies.  Review of Systems  Constitutional: Negative for chills, fever and malaise/fatigue.  HENT: Negative for congestion, sinus pain and sore throat.   Eyes: Negative for blurred vision and pain.  Respiratory: Negative for cough and wheezing.   Cardiovascular: Negative for chest pain and leg swelling.  Gastrointestinal: Negative for abdominal pain, constipation, diarrhea, heartburn, nausea and vomiting.   Genitourinary: Negative for dysuria, frequency, hematuria and urgency.  Musculoskeletal: Negative for back pain, joint pain, myalgias and neck pain.  Skin: Negative for itching and rash.  Neurological: Negative for dizziness, tremors and weakness.  Endo/Heme/Allergies: Does not bruise/bleed easily.  Psychiatric/Behavioral: Negative for depression. The patient is nervous/anxious. The patient does not have insomnia.     Objective: BP 120/80   Ht 5\' 2"  (1.575 m)   Wt 124 lb (56.2 kg)   LMP 04/19/2020   BMI 22.68 kg/m   Filed Weights   05/11/20 1513  Weight: 124 lb (56.2 kg)   Body mass index is 22.68 kg/m. Physical Exam Constitutional:      General: She is not in acute distress.    Appearance: She is well-developed and well-nourished.  Genitourinary:     Bladder, vagina, uterus, rectum and urethral meatus normal.     There is no rash or lesion on the right labia.     There is no rash or lesion on the left labia.    No lesions in the vagina.     No vaginal bleeding.      Right Adnexa: not tender and no mass present.    Left Adnexa: not tender and no mass present.    No cervical motion tenderness, friability, lesion or polyp.     Uterus is mobile.     Uterus is not enlarged.     No uterine mass detected.    Uterus is midaxial.  Pelvic exam was performed with patient in the lithotomy position.  Breasts:     Right: No mass, skin change or tenderness.     Left: No mass, skin change or tenderness.    HENT:     Head: Normocephalic and atraumatic. No laceration.     Right Ear: Hearing normal.     Left Ear: Hearing normal.     Nose: No epistaxis or foreign body.     Mouth/Throat:     Mouth: Oropharynx is clear and moist and mucous membranes are normal.     Pharynx: Uvula midline.  Eyes:     Pupils: Pupils are equal, round, and reactive to light.  Neck:     Thyroid: No thyromegaly.  Cardiovascular:     Rate and Rhythm: Normal rate and regular rhythm.     Heart sounds:  No murmur heard. No friction rub. No gallop.   Pulmonary:     Effort: Pulmonary effort is normal. No respiratory distress.     Breath sounds: Normal breath sounds. No wheezing.  Abdominal:     General: Bowel sounds are normal. There is no distension.     Palpations: Abdomen is soft.     Tenderness: There is no abdominal tenderness. There is no rebound.  Musculoskeletal:        General: Normal range of motion.     Cervical back: Normal range of motion and neck supple.  Neurological:     Mental Status: She is alert and oriented to person, place, and time.     Cranial Nerves: No cranial nerve deficit.  Skin:    General: Skin is warm and dry.  Psychiatric:        Mood and Affect: Mood and affect normal.        Judgment: Judgment normal.  Vitals reviewed.   UA - POS LEUK and BLOOD  Assessment:  ANNUAL EXAM 1. Women's annual routine gynecological examination   2. Screening for cervical cancer   3. Vaginal irritation   4. Anxiety   5. Acute cystitis with hematuria      Screening Plan:            1.  Cervical Screening-  Pap smear done today  2. Breast screening- Exam annually and mammogram>40 planned   3. Anxiety, seeking counseling w appt at [facility] soon  4. Labs - for vag irritation such as BV, STD today  Also, UTI by UA, will check culture Tx as UTI, Bactrim  5. Counseling for contraception: condoms  The pregnancy intention screening data noted above was reviewed. Potential methods of contraception were discussed. The patient elected to proceed with Female Condom.      F/U  Return in about 1 year (around 05/11/2021) for Annual.  Annamarie Major, MD, [facility], [facility] 05/11/2020  3:45 PM

## 2020-05-11 NOTE — Patient Instructions (Addendum)
Thank you for choosing [facility]. As part of our ongoing efforts to improve patient experience, we would appreciate your feedback. Please fill out the short survey that you will receive by mail or MyChart. Your opinion is important to [place]! -Dr Tiburcio Pea  Plan Boric Acid suppository (vaginal) twice weekly Plan Bactrim for UTI for the next 3 days to treat  Health Maintenance, Female Adopting a healthy lifestyle and getting preventive care are important in promoting health and wellness. Ask your health care provider about:  The right schedule for you to have regular tests and exams.  Things you can do on your own to prevent diseases and keep yourself healthy. What should I know about diet, weight, and exercise? Eat a healthy diet  Eat a diet that includes plenty of vegetables, fruits, low-fat dairy products, and lean protein.  Do not eat a lot of foods that are high in solid fats, added sugars, or sodium.   Maintain a healthy weight Body mass index (BMI) is used to identify weight problems. It estimates body fat based on height and weight. Your health care provider can help determine your BMI and help you achieve or maintain a healthy weight. Get regular exercise Get regular exercise. This is one of the most important things you can do for your health. Most adults should:  Exercise for at least 150 minutes each week. The exercise should increase your heart rate and make you sweat (moderate-intensity exercise).  Do strengthening exercises at least twice a week. This is in addition to the moderate-intensity exercise.  Spend less time sitting. Even light physical activity can be beneficial. Watch cholesterol and blood lipids Have your blood tested for lipids and cholesterol at 26 years of age, then have this test every 5 years. Have your cholesterol levels checked more often if:  Your lipid or cholesterol levels are high.  You are older than 26 years of age.  You are at high risk for  heart disease. What should I know about cancer screening? Depending on your health history and family history, you may need to have cancer screening at various ages. This may include screening for:  Breast cancer.  Cervical cancer.  Colorectal cancer.  Skin cancer.  Lung cancer. What should I know about heart disease, diabetes, and high blood pressure? Blood pressure and heart disease  High blood pressure causes heart disease and increases the risk of stroke. This is more likely to develop in people who have high blood pressure readings, are of African descent, or are overweight.  Have your blood pressure checked: ? Every 3-5 years if you are 7-28 years of age. ? Every year if you are 62 years old or older. Diabetes Have regular diabetes screenings. This checks your fasting blood sugar level. Have the screening done:  Once every three years after age 3 if you are at a normal weight and have a low risk for diabetes.  More often and at a younger age if you are overweight or have a high risk for diabetes. What should I know about preventing infection? Hepatitis B If you have a higher risk for hepatitis B, you should be screened for this virus. Talk with your health care provider to find out if you are at risk for hepatitis B infection. Hepatitis C Testing is recommended for:  Everyone born from [birth year] through 1966.  Anyone with known risk factors for hepatitis C. Sexually transmitted infections (STIs)  Get screened for STIs, including gonorrhea and chlamydia, if: ? You  are sexually active and are younger than 26 years of age. ? You are older than 26 years of age and your health care provider tells you that you are at risk for this type of infection. ? Your sexual activity has changed since you were last screened, and you are at increased risk for chlamydia or gonorrhea. Ask your health care provider if you are at risk.  Ask your health care provider about whether you are at  high risk for HIV. Your health care provider may recommend a prescription medicine to help prevent HIV infection. If you choose to take medicine to prevent HIV, you should first get tested for HIV. You should then be tested every 3 months for as long as you are taking the medicine. Pregnancy  If you are about to stop having your period (premenopausal) and you may become pregnant, seek counseling before you get pregnant.  Take 400 to 800 micrograms (mcg) of folic acid every day if you become pregnant.  Ask for birth control (contraception) if you want to prevent pregnancy. Osteoporosis and menopause Osteoporosis is a disease in which the bones lose minerals and strength with aging. This can result in bone fractures. If you are 61 years old or older, or if you are at risk for osteoporosis and fractures, ask your health care provider if you should:  Be screened for bone loss.  Take a calcium or vitamin D supplement to lower your risk of fractures.  Be given hormone replacement therapy (HRT) to treat symptoms of menopause. Follow these instructions at home: Lifestyle  Do not use any products that contain nicotine or tobacco, such as cigarettes, e-cigarettes, and chewing tobacco. If you need help quitting, ask your health care provider.  Do not use street drugs.  Do not share needles.  Ask your health care provider for help if you need support or information about quitting drugs. Alcohol use  Do not drink alcohol if: ? Your health care provider tells you not to drink. ? You are pregnant, may be pregnant, or are planning to become pregnant.  If you drink alcohol: ? Limit how much you use to 0-1 drink a day. ? Limit intake if you are breastfeeding.  Be aware of how much alcohol is in your drink. In the [place] one drink equals one 12 oz bottle of beer (355 mL), one 5 oz glass of wine (148 mL), or one 1 oz glass of hard liquor (44 mL). General instructions  Schedule regular health,  dental, and eye exams.  Stay current with your vaccines.  Tell your health care provider if: ? You often feel depressed. ? You have ever been abused or do not feel safe at home. Summary  Adopting a healthy lifestyle and getting preventive care are important in promoting health and wellness.  Follow your health care provider's instructions about healthy diet, exercising, and getting tested or screened for diseases.  Follow your health care provider's instructions on monitoring your cholesterol and blood pressure. This information is not intended to replace advice given to you by your health care provider. Make sure you discuss any questions you have with your health care provider. Document Revised: 03/17/2018 Document Reviewed: 03/17/2018 Elsevier Patient Education  2021 [employer].

## 2020-05-14 DIAGNOSIS — R87610 Atypical squamous cells of undetermined significance on cytologic smear of cervix (ASC-US): Secondary | ICD-10-CM | POA: Diagnosis not present

## 2020-05-15 ENCOUNTER — Other Ambulatory Visit: Payer: Self-pay | Admitting: Obstetrics & Gynecology

## 2020-05-15 LAB — CERVICOVAGINAL ANCILLARY ONLY
Bacterial Vaginitis (gardnerella): POSITIVE — AB
Candida Glabrata: NEGATIVE
Candida Vaginitis: POSITIVE — AB
Chlamydia: NEGATIVE
Comment: NEGATIVE
Comment: NEGATIVE
Comment: NEGATIVE
Comment: NEGATIVE
Comment: NEGATIVE
Comment: NORMAL
Neisseria Gonorrhea: NEGATIVE
Trichomonas: NEGATIVE

## 2020-05-15 LAB — URINE CULTURE

## 2020-05-15 MED ORDER — METRONIDAZOLE 0.75 % VA GEL: 1.0000 | Freq: Every day | VAGINAL | 0 refills | Status: AC

## 2020-05-15 MED ORDER — FLUCONAZOLE 150 MG PO TABS: 150.0000 mg | ORAL_TABLET | Freq: Once | ORAL | 3 refills | Status: AC

## 2020-05-16 DIAGNOSIS — Z79899 Other long term (current) drug therapy: Secondary | ICD-10-CM | POA: Diagnosis not present

## 2020-05-16 DIAGNOSIS — F329 Major depressive disorder, single episode, unspecified: Secondary | ICD-10-CM | POA: Diagnosis not present

## 2020-05-16 DIAGNOSIS — Z1389 Encounter for screening for other disorder: Secondary | ICD-10-CM | POA: Diagnosis not present

## 2020-05-16 DIAGNOSIS — F6381 Intermittent explosive disorder: Secondary | ICD-10-CM | POA: Diagnosis not present

## 2020-05-17 ENCOUNTER — Encounter: Payer: Self-pay | Admitting: Obstetrics & Gynecology

## 2020-05-17 LAB — CYTOLOGY - PAP
Comment: NEGATIVE
Diagnosis: UNDETERMINED — AB
High risk HPV: NEGATIVE

## 2020-06-13 DIAGNOSIS — F329 Major depressive disorder, single episode, unspecified: Secondary | ICD-10-CM | POA: Diagnosis not present

## 2020-06-13 DIAGNOSIS — R454 Irritability and anger: Secondary | ICD-10-CM | POA: Diagnosis not present

## 2020-07-11 ENCOUNTER — Other Ambulatory Visit: Payer: Self-pay

## 2020-07-11 DIAGNOSIS — F329 Major depressive disorder, single episode, unspecified: Secondary | ICD-10-CM | POA: Diagnosis not present

## 2020-07-11 DIAGNOSIS — R454 Irritability and anger: Secondary | ICD-10-CM | POA: Diagnosis not present

## 2020-07-11 DIAGNOSIS — F411 Generalized anxiety disorder: Secondary | ICD-10-CM | POA: Diagnosis not present

## 2020-07-11 MED ORDER — TRAZODONE HCL 50 MG PO TABS
ORAL_TABLET | ORAL | 1 refills | Status: DC
  Filled 2020-07-11: qty 60, 30d supply, fill #0

## 2020-07-11 MED ORDER — FLUOXETINE HCL 10 MG PO CAPS
ORAL_CAPSULE | ORAL | 1 refills | Status: DC
  Filled 2020-07-11: qty 90, 30d supply, fill #0

## 2020-07-11 MED ORDER — PROPRANOLOL HCL 10 MG PO TABS
ORAL_TABLET | ORAL | 0 refills | Status: DC
  Filled 2020-07-11: qty 60, 30d supply, fill #0

## 2020-07-12 ENCOUNTER — Other Ambulatory Visit: Payer: Self-pay

## 2020-07-27 ENCOUNTER — Other Ambulatory Visit: Payer: Self-pay

## 2020-07-31 DIAGNOSIS — R5383 Other fatigue: Secondary | ICD-10-CM | POA: Diagnosis not present

## 2020-07-31 DIAGNOSIS — L700 Acne vulgaris: Secondary | ICD-10-CM | POA: Diagnosis not present

## 2020-07-31 DIAGNOSIS — Z124 Encounter for screening for malignant neoplasm of cervix: Secondary | ICD-10-CM | POA: Diagnosis not present

## 2020-07-31 DIAGNOSIS — F339 Major depressive disorder, recurrent, unspecified: Secondary | ICD-10-CM | POA: Diagnosis not present

## 2020-08-23 ENCOUNTER — Other Ambulatory Visit: Payer: Self-pay

## 2020-08-23 MED ORDER — PROPRANOLOL HCL 10 MG PO TABS
ORAL_TABLET | ORAL | 0 refills | Status: DC
  Filled 2020-08-23: qty 60, 30d supply, fill #0

## 2020-08-24 ENCOUNTER — Other Ambulatory Visit: Payer: Self-pay | Admitting: Obstetrics & Gynecology

## 2020-08-24 MED ORDER — METRONIDAZOLE 0.75 % VA GEL: 1.0000 | Freq: Every day | VAGINAL | 0 refills | Status: AC

## 2020-09-10 ENCOUNTER — Other Ambulatory Visit: Payer: Self-pay

## 2020-10-04 DIAGNOSIS — Z0000 Encounter for general adult medical examination without abnormal findings: Secondary | ICD-10-CM | POA: Diagnosis not present

## 2020-10-04 DIAGNOSIS — F339 Major depressive disorder, recurrent, unspecified: Secondary | ICD-10-CM | POA: Diagnosis not present

## 2020-10-04 DIAGNOSIS — R5383 Other fatigue: Secondary | ICD-10-CM | POA: Diagnosis not present

## 2020-10-04 DIAGNOSIS — N898 Other specified noninflammatory disorders of vagina: Secondary | ICD-10-CM | POA: Diagnosis not present

## 2020-10-05 ENCOUNTER — Encounter: Payer: Self-pay | Admitting: Obstetrics and Gynecology

## 2020-10-05 ENCOUNTER — Ambulatory Visit (INDEPENDENT_AMBULATORY_CARE_PROVIDER_SITE_OTHER): Payer: 59 | Admitting: Obstetrics and Gynecology

## 2020-10-05 ENCOUNTER — Other Ambulatory Visit: Payer: Self-pay

## 2020-10-05 VITALS — BP 110/80 | Ht 62.0 in | Wt 127.0 lb

## 2020-10-05 DIAGNOSIS — R87810 Cervical high risk human papillomavirus (HPV) DNA test positive: Secondary | ICD-10-CM | POA: Diagnosis not present

## 2020-10-05 DIAGNOSIS — N898 Other specified noninflammatory disorders of vagina: Secondary | ICD-10-CM | POA: Diagnosis not present

## 2020-10-05 NOTE — Progress Notes (Signed)
Patient, No Pcp Per (Inactive)   Chief Complaint  Patient presents with   Vaginal Discharge    Some itchiness and irritation, no odor    Urinary Tract Infection    Some discomfort urinating    HPI:      [name] is a 25 y.o. [id number] whose LMP was Patient's last menstrual period was 09/23/2020 (exact date)., presents today for vaginal itching and irritation, no odor for several days. Also sometimes gets vaginal swelling, dryness and dyspareunia with sx. Saw PCP yesterday and was given diflucan. Sx improving today. Feels like she gets frequent recurrent yeast vag and BV sx since birth of last child a couple yrs ago. Hx of BV and yeast on 2/22 culture, neg STD testing. Pt uses antibacterial soap, dryer sheets sometimes and wears thongs all day. Not taking probiotics. Tried boric acid in past and sx felt worse.  Pt also sometimes with dysuria from concentrated urine. Being eval for issues with eating/drinking, and sometimes gets dehydrated, causing dysuria. Pt's last pap 2/22 was ASCUS/neg HPV DNA. Had neg pap with pos HPV DNA 8/20 with PCP, colpo not indicated. 2018 pap was normal, neg HPV DNA. Pt ha questions about what this means.  Pt seeing PCP for eval of fatigue, arthralgias.   Past Medical History:  Diagnosis Date   Asthma    Heart murmur    Panic attack     Past Surgical History:  Procedure Laterality Date   NO PAST SURGERIES      Family History  Problem Relation Age of Onset   Diabetes Maternal Grandmother    Diabetes Cousin     Social History   Socioeconomic History   Marital status: Married    Spouse name: [name]   Number of children: 2   Years of education: Not on file   Highest education level: Not on file  Occupational History   Not on file  Tobacco Use   Smoking status: Former    Pack years: 0.00   Smokeless tobacco: Never  Vaping Use   Vaping Use: Never used  Substance and Sexual Activity   Alcohol use: Yes   Drug use: Not Currently     Types: Marijuana    Comment: last used 10/20/19   Sexual activity: Yes    Birth control/protection: None, Condom  Other Topics Concern   Not on file  Social History Narrative   Son - [name]    Daughter - [name]   Social Determinants of Health   Financial Resource Strain: Not on file  Food Insecurity: Not on file  Transportation Needs: Not on file  Physical Activity: Not on file  Stress: Not on file  Social Connections: Not on file  Intimate Partner Violence: Not on file    Outpatient Medications Prior to Visit  Medication Sig Dispense Refill   FLUoxetine (PROZAC) 10 MG capsule Take 3 capsules by mouth daily 90 capsule 1   propranolol (INDERAL) 10 MG tablet take 1 tablet by mouth 2 times daily as needed 60 tablet 0   traZODone (DESYREL) 50 MG tablet Take 1-2 tablets by mouth at bedtime as needed 60 tablet 1   propranolol (INDERAL) 10 MG tablet Take 1 tablet by mouth 2 times daily as needed 60 tablet 0   No facility-administered medications prior to visit.      ROS:  Review of Systems  Constitutional:  Positive for fatigue. Negative for fever.  Gastrointestinal:  Negative for blood in stool, constipation, diarrhea, nausea  and vomiting.  Genitourinary:  Positive for dyspareunia, dysuria, vaginal discharge and vaginal pain. Negative for flank pain, frequency, hematuria, urgency and vaginal bleeding.  Musculoskeletal:  Positive for arthralgias. Negative for back pain.  Skin:  Negative for rash.  BREAST: No symptoms   OBJECTIVE:   Vitals:  BP 110/80   Ht 5\' 2"  (1.575 m)   Wt 127 lb (57.6 kg)   LMP 09/23/2020 (Exact Date)   BMI 23.23 kg/m   Physical Exam Constitutional:      Appearance: Normal appearance.  Pulmonary:     Effort: Pulmonary effort is normal.  Genitourinary:    Labia:        Right: No rash, tenderness or lesion.        Left: No rash, tenderness or lesion.      Comments: NEG EXT GYN EXAM Musculoskeletal:        General: Normal range of motion.   Neurological:     Mental Status: She is alert and oriented to person, place, and time.  Psychiatric:        Judgment: Judgment normal.    Assessment/Plan: Vaginal itching--hx of recurrent yeast vag and BV. Sx improving after diflucan tx from PCP today. Discussed changing to dove sens skin soap, line dry underwear, d/c thong use, add probiotics. I think many of her sx of dryness, ext dyspareunia and recurrent yeast vag are due to thong use and rubbing. See if sx improve. If not,  may need wkly diflucan for 3-6 months as preventive  Cervical high risk human papillomavirus (HPV) DNA test positive--neg pap 2020 and ASCUS with neg HPV DNA 2022. Discussed results and mgmt. Repeat in 1 yr. F/u prn.     Return if symptoms worsen or fail to improve.  Onyx Edgley B. Luiscarlos Kaczmarczyk, PA-C 10/05/2020 4:50 PM

## 2020-11-01 ENCOUNTER — Ambulatory Visit
Admission: EM | Admit: 2020-11-01 | Discharge: 2020-11-01 | Disposition: A | Discharge status: Home / Self Care | Payer: 59 | Attending: Emergency Medicine | Admitting: Emergency Medicine

## 2020-11-01 ENCOUNTER — Encounter: Payer: Self-pay | Admitting: Emergency Medicine

## 2020-11-01 DIAGNOSIS — B373 Candidiasis of vulva and vagina: Secondary | ICD-10-CM | POA: Insufficient documentation

## 2020-11-01 DIAGNOSIS — N390 Urinary tract infection, site not specified: Secondary | ICD-10-CM | POA: Insufficient documentation

## 2020-11-01 DIAGNOSIS — B3731 Acute candidiasis of vulva and vagina: Secondary | ICD-10-CM

## 2020-11-01 LAB — POCT URINALYSIS DIP (MANUAL ENTRY)
Bilirubin, UA: NEGATIVE
Glucose, UA: NEGATIVE mg/dL
Ketones, POC UA: NEGATIVE mg/dL
Nitrite, UA: NEGATIVE
Protein Ur, POC: NEGATIVE mg/dL
Spec Grav, UA: 1.025 (ref 1.010–1.025)
Urobilinogen, UA: 0.2 E.U./dL
pH, UA: 7.5 (ref 5.0–8.0)

## 2020-11-01 LAB — POCT URINE PREGNANCY: Preg Test, Ur: NEGATIVE

## 2020-11-01 MED ORDER — PHENAZOPYRIDINE HCL 200 MG PO TABS: 200.0000 mg | ORAL_TABLET | Freq: Three times a day (TID) | ORAL | 0 refills | Status: AC

## 2020-11-01 MED ORDER — FLUCONAZOLE 150 MG PO TABS: 150.0000 mg | ORAL_TABLET | Freq: Every day | ORAL | 0 refills | Status: AC

## 2020-11-01 MED ORDER — NITROFURANTOIN MONOHYD MACRO 100 MG PO CAPS: 100.0000 mg | ORAL_CAPSULE | Freq: Two times a day (BID) | ORAL | 0 refills | Status: AC

## 2020-11-01 NOTE — Discharge Instructions (Addendum)
You were seen today for an infection in the lower urinary tract. Your urine sample today was sent for a culture. The urine culture will show what type of bacteria grows and if you are on the appropriate antibiotic. If the antibiotic needs to be changed, you will receive a phone call from the follow up nurse who will give you more information. If you do not receive a call, then you are on the correct antibiotic.  Take antibiotics as directed. Finish course even if feeling better sooner. Drink plenty of clear fluids. Over the counter "Uristat" or "Azo Standard" may help your discomfort.  This will turn your urine dark orange or red and may stain contacts (remove before using). You may experience 24 - 48 hours of continuing discomfort until medication controls the infection.  Return to clinic or go to the ER if you develop a fever, one-sided back pain, or vomiting as these are signs of a worsening infection.   Take your first Diflucan today and your second Diflucan 3 days after your antibiotics are finished.

## 2020-11-01 NOTE — ED Triage Notes (Signed)
Pt presents today with lower abd pain with dysuria that began this morning. +vaginal burning, denies discharge. She believes she may have an UTI.

## 2020-11-01 NOTE — ED Provider Notes (Signed)
Subjective:    Allison Black is a very pleasant 25 y.o. female who presents with concerns for UTI vs yeast due to vaginal irritation and dysuria onset today.  Patient endorses vaginal burning.  Patient also endorses some nausea at times with lower abdominal discomfort. No unilateral back pain, vomiting, fever, vaginal discharge.  Past medical history, past surgical history, current medications reviewed.  Allergies: has No Known Allergies.  Review of Systems See HPI   Objective:     Vitals:   11/01/20 1002  BP: 97/62  Pulse: 63  Resp: 18  Temp: 97.8 F (36.6 C)  SpO2: 98%     General: Appears well-developed and well-nourished. No acute distress.  Cardiovascular: Normal rate  Pulm/Chest: No respiratory distress.  Neurological: Alert and oriented to person, place, and time.  Skin: Skin is warm and dry.  Psychiatric: Normal mood, affect, behavior, and thought content.  GU:  Deferred secondary to self collect specimen.  Laboratory:  Orders Placed This Encounter  Procedures   Urine Culture   POCT urinalysis dipstick   POCT urine pregnancy   Results for orders placed or performed during the hospital encounter of 11/01/20  POCT urinalysis dipstick  Result Value Ref Range   Color, UA yellow yellow   Clarity, UA cloudy (A) clear   Glucose, UA negative negative mg/dL   Bilirubin, UA negative negative   Ketones, POC UA negative negative mg/dL   Spec Grav, UA 6.967 8.938 - 1.025   Blood, UA small (A) negative   pH, UA 7.5 5.0 - 8.0   Protein Ur, POC negative negative mg/dL   Urobilinogen, UA 0.2 0.2 or 1.0 E.U./dL   Nitrite, UA Negative Negative   Leukocytes, UA Small (1+) (A) Negative  POCT urine pregnancy  Result Value Ref Range   Preg Test, Ur Negative Negative    -hCG negative -Urinalysis reveals cloudy urine, small blood and small leukocytes. Assessment:   1. Acute UTI  2. Vaginal yeast infection  Meds ordered this encounter  Medications   nitrofurantoin,  macrocrystal-monohydrate, (MACROBID) 100 MG capsule    Sig: Take 1 capsule (100 mg total) by mouth 2 (two) times daily for 5 days.    Dispense:  10 capsule    Refill:  0    Order Specific Question:   Supervising Provider    Answer:   Merrilee Jansky [[id number]]   fluconazole (DIFLUCAN) 150 MG tablet    Sig: Take 1 tablet (150 mg total) by mouth daily for 2 days.    Dispense:  2 tablet    Refill:  0    Order Specific Question:   Supervising Provider    Answer:   Merrilee Jansky [[id number]]   phenazopyridine (PYRIDIUM) 200 MG tablet    Sig: Take 1 tablet (200 mg total) by mouth 3 (three) times daily for 2 days.    Dispense:  6 tablet    Refill:  0    Order Specific Question:   Supervising Provider    Answer:   Merrilee Jansky [id number]     Plan:   MDM: Patient presents with concerns for UTI vs yeast due to vaginal irritation and dysuria onset today.  Patient endorses vaginal burning.  Patient also endorses some nausea at times with lower abdominal discomfort. No unilateral back pain, vomiting, fever, vaginal discharge.  Chart review completed.  Given symptoms along with assessment findings and urinalysis results, likely UTI with yeast infection.  Rx'd Macrobid and Pyridium to the patient's preferred pharmacy and  awaiting urine culture results.  Also Rx'd Diflucan to the patient's preferred pharmacy.  Advised about home treatment and care along with strict return/emergency department precautions for severe worsening of symptoms.  Patient verbalized understanding and agreed with plan.  Patient stable upon discharge.  Urinalysis reveals cloudy urine, small blood and small leukocytes.  hCG negative.    Discharge Instructions      You were seen today for an infection in the lower urinary tract. Your urine sample today was sent for a culture. The urine culture will show what type of bacteria grows and if you are on the appropriate antibiotic. If the antibiotic needs to be changed, you will  receive a phone call from the follow up nurse who will give you more information. If you do not receive a call, then you are on the correct antibiotic.  Take antibiotics as directed. Finish course even if feeling better sooner. Drink plenty of clear fluids. Over the counter "Uristat" or "Azo Standard" may help your discomfort.  This will turn your urine dark orange or red and may stain contacts (remove before using). You may experience 24 - 48 hours of continuing discomfort until medication controls the infection.  Return to clinic or go to the ER if you develop a fever, one-sided back pain, or vomiting as these are signs of a worsening infection.   Take your first Diflucan today and your second Diflucan 3 days after your antibiotics are finished.      [name], FNP-C 11/01/20   A copy of these instructions was provided to the patient or responsible parent/guardian, who expressed understanding and agreed with the treatment plan.  All questions addressed.  This note was partially made with the aid of speech-to-text dictation; typographical errors are not intentional.    [name], FNP 11/01/20 1034

## 2020-11-03 LAB — URINE CULTURE: Culture: >=100000 — AB

## 2021-01-01 DIAGNOSIS — M797 Fibromyalgia: Secondary | ICD-10-CM | POA: Diagnosis not present

## 2021-01-01 DIAGNOSIS — F419 Anxiety disorder, unspecified: Secondary | ICD-10-CM | POA: Diagnosis not present

## 2021-01-01 DIAGNOSIS — J9801 Acute bronchospasm: Secondary | ICD-10-CM | POA: Diagnosis not present

## 2021-01-01 DIAGNOSIS — F339 Major depressive disorder, recurrent, unspecified: Secondary | ICD-10-CM | POA: Diagnosis not present

## 2021-03-27 ENCOUNTER — Ambulatory Visit (INDEPENDENT_AMBULATORY_CARE_PROVIDER_SITE_OTHER): Payer: 59 | Admitting: Psychiatry

## 2021-03-27 ENCOUNTER — Other Ambulatory Visit: Payer: Self-pay

## 2021-03-27 ENCOUNTER — Encounter: Payer: Self-pay | Admitting: Psychiatry

## 2021-03-27 VITALS — BP 105/70 | HR 87 | Temp 98.3°F | Ht 63.0 in | Wt 126.6 lb

## 2021-03-27 DIAGNOSIS — F411 Generalized anxiety disorder: Secondary | ICD-10-CM | POA: Diagnosis not present

## 2021-03-27 DIAGNOSIS — F319 Bipolar disorder, unspecified: Secondary | ICD-10-CM

## 2021-03-27 DIAGNOSIS — F1221 Cannabis dependence, in remission: Secondary | ICD-10-CM | POA: Insufficient documentation

## 2021-03-27 DIAGNOSIS — F1220 Cannabis dependence, uncomplicated: Secondary | ICD-10-CM | POA: Insufficient documentation

## 2021-03-27 DIAGNOSIS — F1290 Cannabis use, unspecified, uncomplicated: Secondary | ICD-10-CM | POA: Diagnosis not present

## 2021-03-27 MED ORDER — GABAPENTIN 300 MG PO CAPS: 300.0000 mg | ORAL_CAPSULE | Freq: Three times a day (TID) | ORAL | 1 refills | Status: DC

## 2021-03-27 MED ORDER — HYDROXYZINE HCL 10 MG PO TABS: 10.0000 mg | ORAL_TABLET | Freq: Three times a day (TID) | ORAL | 1 refills | Status: DC | PRN

## 2021-03-27 MED ORDER — FLUOXETINE HCL 20 MG PO CAPS: 20.0000 mg | ORAL_CAPSULE | Freq: Every day | ORAL | 0 refills | Status: DC

## 2021-03-27 NOTE — Patient Instructions (Signed)
Hydroxyzine Capsules or Tablets What is this medication? HYDROXYZINE (hye Chalfant i zeen) treats the symptoms of allergies and allergic reactions. It may also be used to treat anxiety or cause drowsiness before a procedure. It works by blocking histamine, a substance released by the body during an allergic reaction. It belongs to a group of medications called antihistamines. This medicine may be used for other purposes; ask your health care provider or pharmacist if you have questions. COMMON BRAND NAME(S): ANX, Atarax, Rezine, Vistaril What should I tell my care team before I take this medication? They need to know if you have any of these conditions: Glaucoma Heart disease History of irregular heartbeat Kidney disease Liver disease Lung or breathing disease, like asthma Stomach or intestine problems Thyroid disease Trouble passing urine An unusual or allergic reaction to hydroxyzine, cetirizine, other medications, foods, dyes or preservatives Pregnant or trying to get pregnant Breast-feeding How should I use this medication? Take this medication by mouth with a full glass of water. Follow the directions on the prescription label. You may take this medication with food or on an empty stomach. Take your medication at regular intervals. Do not take your medication more often than directed. Talk to your care team regarding the use of this medication in children. Special care may be needed. While this medication may be prescribed for children as young as 1 years of age for selected conditions, precautions do apply. Patients over 15 years old may have a stronger reaction and need a smaller dose. Overdosage: If you think you have taken too much of this medicine contact a poison control center or emergency room at once. NOTE: This medicine is only for you. Do not share this medicine with others. What if I miss a dose? If you miss a dose, take it as soon as you can. If it is almost time for your next  dose, take only that dose. Do not take double or extra doses. What may interact with this medication? Do not take this medication with any of the following: Cisapride Dronedarone Pimozide Thioridazine This medication may also interact with the following: Alcohol Antihistamines for allergy, cough, and cold Atropine Barbiturate medications for sleep or seizures, like phenobarbital Certain antibiotics like erythromycin or clarithromycin Certain medications for anxiety or sleep Certain medications for bladder problems like oxybutynin, tolterodine Certain medications for depression or psychotic disturbances Certain medications for irregular heart beat Certain medications for Parkinson's disease like benztropine, trihexyphenidyl Certain medications for seizures like phenobarbital, primidone Certain medications for stomach problems like dicyclomine, hyoscyamine Certain medications for travel sickness like scopolamine Ipratropium Narcotic medications for pain Other medications that prolong the QT interval (which can cause an abnormal heart rhythm) like dofetilide This list may not describe all possible interactions. Give your health care provider a list of all the medicines, herbs, non-prescription drugs, or dietary supplements you use. Also tell them if you smoke, drink alcohol, or use illegal drugs. Some items may interact with your medicine. What should I watch for while using this medication? Tell your care team if your symptoms do not improve. You may get drowsy or dizzy. Do not drive, use machinery, or do anything that needs mental alertness until you know how this medication affects you. Do not stand or sit up quickly, especially if you are an older patient. This reduces the risk of dizzy or fainting spells. Alcohol may interfere with the effect of this medication. Avoid alcoholic drinks. Your mouth may get dry. Chewing sugarless gum or sucking hard  candy, and drinking plenty of water may  help. Contact your care team if the problem does not go away or is severe. This medication may cause dry eyes and blurred vision. If you wear contact lenses you may feel some discomfort. Lubricating drops may help. See your eye care specialist if the problem does not go away or is severe. If you are receiving skin tests for allergies, tell your care team you are using this medication. What side effects may I notice from receiving this medication? Side effects that you should report to your care team as soon as possible: Allergic reactions--skin rash, itching, hives, swelling of the face, lips, tongue, or throat Heart rhythm changes--fast or irregular heartbeat, dizziness, feeling faint or lightheaded, chest pain, trouble breathing Side effects that usually do not require medical attention (report to your care team if they continue or are bothersome): Confusion Drowsiness Dry mouth Hallucinations Headache This list may not describe all possible side effects. Call your doctor for medical advice about side effects. You may report side effects to FDA at 1-800-FDA-1088. Where should I keep my medication? Keep out of the reach of children and pets. Store at room temperature between 15 and 30 degrees C (59 and 86 degrees F). Keep container tightly closed. Throw away any unused medication after the expiration date. NOTE: This sheet is a summary. It may not cover all possible information. If you have questions about this medicine, talk to your doctor, pharmacist, or health care provider.  2022 Elsevier/Gold Standard (2020-06-06 00:00:00) Cannabis Use Disorder Cannabis use disorder occurs when marijuana use disrupts a person's daily life or causes health problems. This condition can be dangerous. The health problems this condition can cause include: Long-lasting problems with thinking and learning. These can be permanent in young people. Mental health problems, such as severe anxiety, paranoia,  hallucinations, or schizophrenia. Dangerously high blood pressure and heart rate. Breathing problems. Problems with child development during and after pregnancy. People with this condition are also more likely to use other drugs. What are the causes? This condition is caused by using marijuana too much over time. It is not caused by using it only once in a while. Many people with this condition use marijuana because it gives them a feeling of extreme pleasure or relaxation. What increases the risk? This condition is more likely to develop in: Men. People with a family history of cannabis use disorder. People with mental health issues such as depression or post-traumatic stress disorder (PTSD). What are the signs or symptoms? Symptoms of this condition include: Addiction Using greater amounts of marijuana than you want to, or using marijuana for longer than you want to. Craving marijuana. Spending a lot of time getting marijuana, using it, or recovering from its effects. Having problems at work, at school, at home, or in relationships because of marijuana use. Giving up or cutting down on important life activities because of marijuana use. Using marijuana at times when it is dangerous, such as while you are driving a car. Needing more and more marijuana to get the same desired effect (building up a tolerance). Lack of motivation, known as amotivational syndrome, which leads to poor school and work performance. Physical problems A long-lasting cough. Bronchitis. Emphysema. Throat and lung cancer. Mental problems Psychosis. Anxiety. Trouble sleeping. Increase in violent behavior in young people. Withdrawal problems You may have symptoms when you stop using marijuana. Symptoms include: Irritability or anger. Anxiety or restlessness. Trouble sleeping. Loss of appetite or weight loss. Aches and  pains. Shakiness, sweating, or chills. How is this diagnosed? This condition is  diagnosed with an assessment. During the assessment, your health care provider will ask about your marijuana use and how it affects your life. You will be diagnosed with the condition if you have had at least two symptoms of this condition within a 40-month period. How severe the condition is depends on how many symptoms you have. If you have two to three symptoms, your condition is mild. If you have four to five symptoms, your condition is moderate. If you have six or more symptoms, your condition is severe. Your health care provider may perform a physical exam or do lab tests to see if you have physical problems resulting from marijuana use. Your health care provider may also screen for drug use and refer you to a mental health professional for evaluation. How is this treated? Treatment for this condition is usually provided by mental health professionals with training in substance use disorders. Your treatment may involve: Counseling. This treatment is also called talk therapy. It is provided by substance use treatment counselors. A counselor can address the reasons you use marijuana and suggest ways to keep you from using it again. The goals of talk therapy are to: Find healthy activities to replace using marijuana. Identify and avoid the things that trigger your marijuana use. Help you learn how to handle cravings. Support groups. Support groups are led by people who have quit using marijuana. They provide emotional support, advice, and guidance. Medicine. Medicine is used to treat mental health issues that trigger marijuana use or that result from it. Follow these instructions at home: Lifestyle Make healthy lifestyle choices, such as: Eating a healthy diet. Getting enough exercise. Improving your stress-management skills.  General instructions Take over-the-counter, prescription medicines, and herbal remedies only as told by your health care provider. Check with your health care provider  before starting any new medicines. Work with Higher education careers adviser or group to develop tools to keep you from using marijuana again (relapsing). Learn daily living skills and work Psychologist, occupational. Keep all follow-up visits as told by your health care provider. This is important. Where to find more information [employer] on Drug Abuse: motorcyclefax.com Centers for Disease Control and Prevention: http://www.wolf.info/ Substance Abuse and Mental Health Services Administration: ktimeonline.com Contact a health care provider if: You are not able to take your medicines as told. Your symptoms get worse. Get help right away if: You have serious thoughts about hurting yourself or others. If you ever feel like you may hurt yourself or others, or have thoughts about taking your own life, get help right away. You can go to your nearest emergency department or call: Your local emergency services (911 in the [place] A suicide crisis helpline, such as the [place] at (872) 045-0942 or 988 in the [place]. This is open 24 hours a day in the [place] Summary Cannabis use disorder is when using marijuana disrupts a person's daily life or causes health problems. This condition is caused by using marijuana too much over time. Treatment for this condition is usually provided by mental health professionals with training in substance use disorders. Treatment may involve counseling, support groups, or medicine. This information is not intended to replace advice given to you by your health care provider. Make sure you discuss any questions you have with your health care provider. Document Revised: 10/17/2020 Document Reviewed: 02/22/2019 Elsevier Patient Education  2022 [employer].
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
## 2021-04-18 ENCOUNTER — Ambulatory Visit: Payer: 59 | Admitting: Psychiatry

## 2021-04-23 ENCOUNTER — Telehealth (INDEPENDENT_AMBULATORY_CARE_PROVIDER_SITE_OTHER): Payer: 59 | Admitting: Psychiatry

## 2021-04-23 ENCOUNTER — Encounter: Payer: Self-pay | Admitting: Psychiatry

## 2021-04-23 ENCOUNTER — Other Ambulatory Visit: Payer: Self-pay

## 2021-04-23 DIAGNOSIS — Z91199 Patient's noncompliance with other medical treatment and regimen due to unspecified reason: Secondary | ICD-10-CM | POA: Diagnosis not present

## 2021-04-23 DIAGNOSIS — F319 Bipolar disorder, unspecified: Secondary | ICD-10-CM

## 2021-04-23 DIAGNOSIS — F1290 Cannabis use, unspecified, uncomplicated: Secondary | ICD-10-CM

## 2021-04-23 DIAGNOSIS — F411 Generalized anxiety disorder: Secondary | ICD-10-CM

## 2021-04-23 DIAGNOSIS — Z9189 Other specified personal risk factors, not elsewhere classified: Secondary | ICD-10-CM

## 2021-04-23 HISTORY — DX: Cannabis use, unspecified, uncomplicated: F12.90

## 2021-04-23 NOTE — Progress Notes (Signed)
Virtual Visit via Video Note  I connected with Allison Black Black on 04/23/21 at 10:30 AM EST by a video enabled telemedicine application and verified that I am speaking with the correct person using two identifiers.  Location Provider Location : [facility] Patient Location : Home  Participants: Patient , Provider    I discussed the limitations of evaluation and management by telemedicine and the availability of in person appointments. The patient expressed understanding and agreed to proceed.  I discussed the assessment and treatment plan with the patient. The patient was provided an opportunity to ask questions and all were answered. The patient agreed with the plan and demonstrated an understanding of the instructions.   The patient was advised to call back or seek an in-person evaluation if the symptoms worsen or if the condition fails to improve as anticipated.    BH MD OP Progress Note  04/23/2021 11:09 AM Allison Black  MRN:  [id number]  Chief Complaint:  Chief Complaint   Follow-up; Anxiety    HPI: Allison Black is a 26 year old Caucasian female, employed, lives in [place], separated, has a history of bipolar and related disorders, GAD, long-term use of cannabis, fibromyalgia, was evaluated by telemedicine today.  Patient missed an appointment recently, reported that her daughter was sick and hence was unable to keep it.  Patient today returns reporting worsening anxiety, worrying about different things, having racing thoughts, concentration problems.  This has been getting worse since the past few weeks.  Current medications are not beneficial per her report.  She does have upcoming appointment for therapy.  Patient also reports worsening mood symptoms, irritability, snaps at others a lot, excessive sleepiness, difficulty falling asleep at night, lack of appetite, low energy, concentration problems.  Patient reports she also struggles with extreme fatigue likely multifactorial  including her history of fibromyalgia.  Patient reports most days she wakes up in the morning and does not want to get out of the bed and has had thoughts of not wanting to be awake.  Patient however reports she does not have any suicidal thoughts.  Patient denies any homicidality or perceptual disturbances.  Patient is compliant on her current medications.  Denies side effects.  Patient denies any other concerns today.  Visit Diagnosis:    ICD-10-CM   1. Bipolar and related disorder (HCC)  F31.9    unspecified    2. GAD (generalized anxiety disorder)  F41.1     3. Long term current use of cannabis  F12.90     4. At risk for prolonged QT interval syndrome  Z91.89 EKG 12-Lead    5. Noncompliance with treatment plan  Z91.199       Past Psychiatric History: Reviewed past psychiatric history from progress note on 03/27/2021.  Past trials of Prozac, gabapentin-fibromyalgia, propranolol.  Past Medical History:  Past Medical History:  Diagnosis Date   Asthma    Heart murmur    Panic attack     Past Surgical History:  Procedure Laterality Date   NO PAST SURGERIES      Family Psychiatric History: Reviewed family psychiatric history from progress note on 03/27/2021.  Family History:  Family History  Problem Relation Age of Onset   Bipolar disorder Mother    Mental illness Sister    Bipolar disorder Maternal Aunt    Bipolar disorder Maternal Aunt    Bipolar disorder Maternal Aunt    Schizophrenia Maternal Aunt    Suicidality Maternal Aunt    Diabetes Maternal Grandmother    Diabetes  Cousin     Social History: Reviewed social history from progress note on 03/27/2021. Social History   Socioeconomic History   Marital status: Married    Spouse name: [name]   Number of children: 2   Years of education: Not on file   Highest education level: Not on file  Occupational History   Not on file  Tobacco Use   Smoking status: Former   Smokeless tobacco: Never  Vaping Use    Vaping Use: Never used  Substance and Sexual Activity   Alcohol use: Yes    Alcohol/week: 1.0 standard drink    Types: 1 Shots of liquor per week    Comment: 1-2 shots a few times a month   Drug use: Yes    Frequency: 28.0 times per week    Types: Marijuana    Comment: last used 10/20/19   Sexual activity: Yes    Birth control/protection: None, Condom  Other Topics Concern   Not on file  Social History Narrative   Son - [name]    Daughter - [name]   Social Determinants of Health   Financial Resource Strain: Not on file  Food Insecurity: Not on file  Transportation Needs: Not on file  Physical Activity: Not on file  Stress: Not on file  Social Connections: Not on file    Allergies: No Known Allergies  Metabolic Disorder Labs: No results found for: HGBA1C, MPG No results found for: PROLACTIN No results found for: CHOL, TRIG, HDL, CHOLHDL, VLDL, LDLCALC No results found for: TSH  Therapeutic Level Labs: No results found for: LITHIUM No results found for: VALPROATE No components found for:  CBMZ  Current Medications: Current Outpatient Medications  Medication Sig Dispense Refill   albuterol (VENTOLIN HFA) 108 (90 Base) MCG/ACT inhaler Inhale into the lungs.     FLUoxetine (PROZAC) 20 MG capsule Take 1 capsule (20 mg total) by mouth daily with breakfast. 90 capsule 0   gabapentin (NEURONTIN) 300 MG capsule Take 1 capsule (300 mg total) by mouth 3 (three) times daily. 90 capsule 1   hydrOXYzine (ATARAX) 10 MG tablet Take 1 tablet (10 mg total) by mouth 3 (three) times daily as needed for anxiety. 90 tablet 1   No current facility-administered medications for this visit.     Musculoskeletal: Strength & Muscle Tone:  UTA Gait & Station:  Seated Patient leans: N/A  Psychiatric Specialty Exam: Review of Systems  Constitutional:  Positive for fatigue.  Psychiatric/Behavioral:  Positive for decreased concentration, dysphoric mood and sleep disturbance. The patient is  nervous/anxious.   All other systems reviewed and are negative.  There were no vitals taken for this visit.There is no height or weight on file to calculate BMI.  General Appearance: Casual  Eye Contact:  Fair  Speech:  Clear and Coherent  Volume:  Normal  Mood:  Anxious, Depressed, and Irritable  Affect:  Congruent  Thought Process:  Goal Directed and Descriptions of Associations: Intact  Orientation:  Full (Time, Place, and Person)  Thought Content: Logical   Suicidal Thoughts:  No  Homicidal Thoughts:  No  Memory:  Immediate;   Fair Recent;   Fair Remote;   Fair  Judgement:  Fair  Insight:  Fair  Psychomotor Activity:  Normal  Concentration:  Concentration: Fair and Attention Span: Fair  Recall:  [employer] of Knowledge: Fair  Language: Fair  Akathisia:  No  Handed:  Right  AIMS (if indicated): not done  Assets:  Communication Skills Desire for  Improvement Housing Intimacy Social Support  ADL's:  Intact  Cognition: WNL  Sleep:  Poor   Screenings: GAD-7    Flowsheet Row Video Visit from 04/23/2021 in [facility] Office Visit from 01/06/2018 in [facility]  Total GAD-7 Score 19 20      PHQ2-9    Flowsheet Row Video Visit from 04/23/2021 in [facility] Office Visit from 03/27/2021 in [facility] Office Visit from 01/06/2018 in [facility]  PHQ-2 Total Score 6 6 4   PHQ-9 Total Score 24 25 18       Flowsheet Row Video Visit from 04/23/2021 in [facility] Office Visit from 03/27/2021 in [facility] ED from 11/01/2020 in [facility]   C-SSRS RISK CATEGORY Low Risk No Risk No Risk        Assessment and Plan: Latravia Southgate is a 26 year old Caucasian female, employed, separated, has a history of anxiety, depression, fibromyalgia, abdominal pain was evaluated by telemedicine  today.  Patient with worsening mood symptoms, will benefit from the following medication management as well as will need psychotherapy sessions.  Plan as noted below.  Plan  Bipolar disorder unspecified-rule out bipolar type I versus secondary to substance use-cannabis-unstable Continue gabapentin 300 mg p.o. 3 times daily Prozac at lower dose to 20 mg p.o. daily. We will consider adding Abilify-however she will need an EKG prior to that. Referred for CBT-pending.  She has upcoming appointment with Ms. Christina Hussami  GAD-unstable Continue Prozac 20 mg p.o. daily Hydroxyzine 10 mg p.o. 3 times daily as needed for anxiety attacks Referred for CBT-pending.  She will need more frequent psychotherapy sessions.  Long-term use of cannabis-rule out cannabis use disorder-unstable Provided education. Patient reports she is trying to cut back.  Noncompliant with labs-pending labs TSH, urine drug screen.  Patient advised to get labs done.  At risk for prolonged QT syndrome-we will order EKG.  Patient provided with phone #[phone].  Discussed with patient that once I review the EKG will consider adding Abilify at low dosage to manage her mood symptoms.  She will also benefit from a sleep medication in the future.  Follow-up in clinic in 2 weeks or sooner if needed.  Patient advised to contact the clinic to make a follow-up appointment after getting EKG completed.  This note was generated in part or whole with voice recognition software. Voice recognition is usually quite accurate but there are transcription errors that can and very often do occur. I apologize for any typographical errors that were not detected and corrected.      30, MD 04/23/2021, 11:09 AM

## 2021-04-26 ENCOUNTER — Other Ambulatory Visit: Payer: Self-pay

## 2021-04-26 ENCOUNTER — Ambulatory Visit
Admission: RE | Admit: 2021-04-26 | Discharge: 2021-04-26 | Disposition: A | Discharge status: Home / Self Care | Payer: 59 | Source: Ambulatory Visit | Attending: Psychiatry | Admitting: Psychiatry

## 2021-04-26 DIAGNOSIS — Z9189 Other specified personal risk factors, not elsewhere classified: Secondary | ICD-10-CM | POA: Insufficient documentation

## 2021-04-29 ENCOUNTER — Telehealth: Payer: Self-pay | Admitting: Psychiatry

## 2021-04-29 NOTE — Telephone Encounter (Signed)
Attempted to contact patient to discuss EKG as well as to make medication changes as discussed during her last visit.  Had to leave a voicemail.

## 2021-05-02 ENCOUNTER — Telehealth: Payer: Self-pay

## 2021-05-02 DIAGNOSIS — F319 Bipolar disorder, unspecified: Secondary | ICD-10-CM

## 2021-05-02 MED ORDER — ARIPIPRAZOLE 2 MG PO TABS: 2.0000 mg | ORAL_TABLET | Freq: Every day | ORAL | 0 refills | Status: DC

## 2021-05-02 NOTE — Telephone Encounter (Signed)
pt is returning your call about her EKG.

## 2021-05-02 NOTE — Telephone Encounter (Signed)
Returned call to patient.  Discussed EKG. Discuss starting Abilify 2 mg, patient agreeable.  Patient advised to make an appointment in 2 weeks to reevaluate.

## 2021-05-06 ENCOUNTER — Other Ambulatory Visit: Payer: Self-pay

## 2021-05-06 ENCOUNTER — Other Ambulatory Visit (HOSPITAL_COMMUNITY)
Admission: RE | Admit: 2021-05-06 | Discharge: 2021-05-06 | Disposition: A | Discharge status: Home / Self Care | Payer: 59 | Source: Ambulatory Visit | Attending: Obstetrics and Gynecology | Admitting: Obstetrics and Gynecology

## 2021-05-06 ENCOUNTER — Encounter: Payer: Self-pay | Admitting: Obstetrics and Gynecology

## 2021-05-06 ENCOUNTER — Ambulatory Visit (INDEPENDENT_AMBULATORY_CARE_PROVIDER_SITE_OTHER): Payer: 59 | Admitting: Obstetrics and Gynecology

## 2021-05-06 VITALS — BP 110/70 | Ht 62.0 in | Wt 121.0 lb

## 2021-05-06 DIAGNOSIS — N926 Irregular menstruation, unspecified: Secondary | ICD-10-CM

## 2021-05-06 DIAGNOSIS — N898 Other specified noninflammatory disorders of vagina: Secondary | ICD-10-CM

## 2021-05-06 DIAGNOSIS — Z113 Encounter for screening for infections with a predominantly sexual mode of transmission: Secondary | ICD-10-CM | POA: Insufficient documentation

## 2021-05-06 DIAGNOSIS — N3001 Acute cystitis with hematuria: Secondary | ICD-10-CM | POA: Insufficient documentation

## 2021-05-06 LAB — POCT URINALYSIS DIPSTICK
Bilirubin, UA: NEGATIVE
Glucose, UA: NEGATIVE
Ketones, UA: NEGATIVE
Nitrite, UA: POSITIVE
Protein, UA: NEGATIVE
Spec Grav, UA: 1.015 (ref 1.010–1.025)
pH, UA: 6 (ref 5.0–8.0)

## 2021-05-06 LAB — POCT URINE PREGNANCY: Preg Test, Ur: NEGATIVE

## 2021-05-06 LAB — POCT WET PREP WITH KOH
Clue Cells Wet Prep HPF POC: NEGATIVE
KOH Prep POC: NEGATIVE
Trichomonas, UA: NEGATIVE
Yeast Wet Prep HPF POC: NEGATIVE

## 2021-05-06 MED ORDER — PHENAZOPYRIDINE HCL 200 MG PO TABS: 200.0000 mg | ORAL_TABLET | Freq: Three times a day (TID) | ORAL | 0 refills | Status: DC | PRN

## 2021-05-06 MED ORDER — NITROFURANTOIN MONOHYD MACRO 100 MG PO CAPS: 100.0000 mg | ORAL_CAPSULE | Freq: Two times a day (BID) | ORAL | 0 refills | Status: AC

## 2021-05-06 NOTE — Patient Instructions (Signed)
I value your feedback and you entrusting us with your care. If you get a [facility] patient survey, I would appreciate you taking the time to let us know about your experience today. Thank you! ? ? ?

## 2021-05-06 NOTE — Progress Notes (Signed)
Pcp, No   Chief Complaint  Patient presents with   Vaginal Irritation    Discharge, no odor or itchiness x 2 days   Urinary Tract Infection    Frequency and burning urinating, pelvic pain as of this morning    HPI:      [name] is a 26 y.o. [id number] whose LMP was Patient's last menstrual period was 04/27/2021 (exact date)., presents today for UTI sx since this AM of urinary frequency/urgency, pelvic pain and dysuria. No hematuria/LBP/fevers. Hx of UTIs in past. Has been dehydrated recently. Also with slight increased d/c with irritation for 2 days, no fishy odor. Just feels like infections in past. Hx of BV and yeast on cultures in past. No recent abx use, no meds to treat.  She is sex active, no new partners. Uses condoms sometimes. Menses monthly, usually 7-8 days, no dysmen, but LMP was only 3-4 days. No UPT done.   ASCUS/neg HPV DNA 2/22; repeat due after 3 yrs  Patient Active Problem List   Diagnosis Date Noted   Acute cystitis with hematuria 05/06/2021   Long term current use of cannabis 04/23/2021   At risk for prolonged QT interval syndrome 04/23/2021   Noncompliance with treatment plan 04/23/2021   Bipolar and related disorder (HCC) 03/27/2021   GAD (generalized anxiety disorder) 03/27/2021   Cannabis use disorder, moderate, dependence (HCC) 03/27/2021   Asthma 02/10/2017   Need for immunization against influenza 01/27/2017   Episodic lightheadedness 12/19/2016   Depression with anxiety 10/09/2016    Past Surgical History:  Procedure Laterality Date   NO PAST SURGERIES      Family History  Problem Relation Age of Onset   Bipolar disorder Mother    Lupus Mother    Mental illness Sister    Bipolar disorder Maternal Aunt    Bipolar disorder Maternal Aunt    Bipolar disorder Maternal Aunt    Schizophrenia Maternal Aunt    Suicidality Maternal Aunt    Diabetes Maternal Grandmother    Diabetes Cousin     Social History   Socioeconomic History    Marital status: Married    Spouse name: [name]   Number of children: 2   Years of education: Not on file   Highest education level: Not on file  Occupational History   Not on file  Tobacco Use   Smoking status: Former   Smokeless tobacco: Never  Vaping Use   Vaping Use: Never used  Substance and Sexual Activity   Alcohol use: Yes    Alcohol/week: 1.0 standard drink    Types: 1 Shots of liquor per week    Comment: 1-2 shots a few times a month   Drug use: Yes    Frequency: 28.0 times per week    Types: Marijuana    Comment: last used 10/20/19   Sexual activity: Yes    Birth control/protection: None, Condom  Other Topics Concern   Not on file  Social History Narrative   Son - [name]    Daughter - [place]   Social Determinants of Health   Financial Resource Strain: Not on file  Food Insecurity: Not on file  Transportation Needs: Not on file  Physical Activity: Not on file  Stress: Not on file  Social Connections: Not on file  Intimate Partner Violence: Not on file    Outpatient Medications Prior to Visit  Medication Sig Dispense Refill   albuterol (VENTOLIN HFA) 108 (90 Base) MCG/ACT inhaler Inhale into the  lungs.     ARIPiprazole (ABILIFY) 2 MG tablet Take 1 tablet (2 mg total) by mouth daily with breakfast. 30 tablet 0   FLUoxetine (PROZAC) 20 MG capsule Take 1 capsule (20 mg total) by mouth daily with breakfast. 90 capsule 0   gabapentin (NEURONTIN) 300 MG capsule Take 1 capsule (300 mg total) by mouth 3 (three) times daily. 90 capsule 1   hydrOXYzine (ATARAX) 10 MG tablet Take 1 tablet (10 mg total) by mouth 3 (three) times daily as needed for anxiety. 90 tablet 1   No facility-administered medications prior to visit.      ROS:  Review of Systems  Constitutional:  Negative for fever.  Gastrointestinal:  Negative for blood in stool, constipation, diarrhea, nausea and vomiting.  Genitourinary:  Positive for dysuria, frequency, pelvic pain, urgency and vaginal  discharge. Negative for dyspareunia, flank pain, hematuria, vaginal bleeding and vaginal pain.  Musculoskeletal:  Negative for back pain.  Skin:  Negative for rash.  BREAST: No symptoms   OBJECTIVE:   Vitals:  BP 110/70    Ht 5\' 2"  (1.575 m)    Wt 121 lb (54.9 kg)    LMP 04/27/2021 (Exact Date)    BMI 22.13 kg/m   Physical Exam Vitals reviewed.  Constitutional:      Appearance: She is well-developed. She is not ill-appearing or toxic-appearing.  Pulmonary:     Effort: Pulmonary effort is normal.  Abdominal:     Palpations: Abdomen is soft.     Tenderness: There is abdominal tenderness in the suprapubic area. There is no right CVA tenderness or left CVA tenderness.  Genitourinary:    General: Normal vulva.     Pubic Area: No rash.      Labia:        Right: No rash, tenderness or lesion.        Left: No rash, tenderness or lesion.      Vagina: Normal. No vaginal discharge, erythema, tenderness or bleeding.     Cervix: Normal.     Uterus: Normal. Tender. Not enlarged.      Adnexa: Right adnexa normal and left adnexa normal.       Right: No mass or tenderness.         Left: No mass or tenderness.    Musculoskeletal:        General: Normal range of motion.     Cervical back: Normal range of motion.  Skin:    General: Skin is warm and dry.  Neurological:     General: No focal deficit present.     Mental Status: She is alert and oriented to person, place, and time.     Cranial Nerves: No cranial nerve deficit.  Psychiatric:        Mood and Affect: Mood normal.        Behavior: Behavior normal.        Thought Content: Thought content normal.        Judgment: Judgment normal.    Results: Results for orders placed or performed in visit on 05/06/21 (from the past 24 hour(s))  POCT urine pregnancy     Status: Normal   Collection Time: 05/06/21 11:09 AM  Result Value Ref Range   Preg Test, Ur Negative Negative  POCT Wet Prep with KOH     Status: Normal   Collection Time:  05/06/21 11:10 AM  Result Value Ref Range   Trichomonas, UA Negative    Clue Cells Wet Prep HPF POC neg  Epithelial Wet Prep HPF POC     Yeast Wet Prep HPF POC neg    Bacteria Wet Prep HPF POC     RBC Wet Prep HPF POC     WBC Wet Prep HPF POC     KOH Prep POC Negative Negative  POCT Urinalysis Dipstick     Status: Abnormal   Collection Time: 05/06/21 11:10 AM  Result Value Ref Range   Color, UA yellow    Clarity, UA clear    Glucose, UA Negative Negative   Bilirubin, UA neg    Ketones, UA neg    Spec Grav, UA 1.015 1.010 - 1.025   Blood, UA large    pH, UA 6.0 5.0 - 8.0   Protein, UA Negative Negative   Urobilinogen, UA     Nitrite, UA pos    Leukocytes, UA Moderate (2+) (A) Negative   Appearance     Odor       Assessment/Plan: Acute cystitis with hematuria - Plan: POCT Urinalysis Dipstick, Urine Culture, nitrofurantoin, macrocrystal-monohydrate, (MACROBID) 100 MG capsule, phenazopyridine (PYRIDIUM) 200 MG tablet; pos sx and UA. Rx macrobid and pyridium. Check C&S. F/u prn. Increase water intake.   Vaginal discharge - Plan: POCT Wet Prep with KOH, Cervicovaginal ancillary only; neg wet prep. Check culture. Will f/u if pos. If neg, most likely UTI sx.   Screening for STD (sexually transmitted disease) - Plan: Cervicovaginal ancillary only  Menstrual changes - Plan: POCT urine pregnancy; neg UPT. Light period last cycle. Follow prn.    Meds ordered this encounter  Medications   nitrofurantoin, macrocrystal-monohydrate, (MACROBID) 100 MG capsule    Sig: Take 1 capsule (100 mg total) by mouth 2 (two) times daily for 5 days.    Dispense:  10 capsule    Refill:  0    Order Specific Question:   Supervising Provider    Answer:   Gae Dry [id number]   phenazopyridine (PYRIDIUM) 200 MG tablet    Sig: Take 1 tablet (200 mg total) by mouth 3 (three) times daily as needed for pain (urethral spasm).    Dispense:  10 tablet    Refill:  0    Order Specific Question:    Supervising Provider    Answer:   Gae Dry [id number]      Return if symptoms worsen or fail to improve.  Lilliah Priego B. Dat Derksen, PA-C 05/06/2021 4:59 PM

## 2021-05-07 ENCOUNTER — Encounter: Payer: Self-pay | Admitting: Obstetrics and Gynecology

## 2021-05-07 ENCOUNTER — Ambulatory Visit: Payer: 59 | Admitting: Obstetrics and Gynecology

## 2021-05-07 DIAGNOSIS — B3731 Acute candidiasis of vulva and vagina: Secondary | ICD-10-CM

## 2021-05-07 DIAGNOSIS — A749 Chlamydial infection, unspecified: Secondary | ICD-10-CM

## 2021-05-07 LAB — CERVICOVAGINAL ANCILLARY ONLY
Bacterial Vaginitis (gardnerella): NEGATIVE
Candida Glabrata: NEGATIVE
Candida Vaginitis: POSITIVE — AB
Chlamydia: POSITIVE — AB
Comment: NEGATIVE
Comment: NEGATIVE
Comment: NEGATIVE
Comment: NEGATIVE
Comment: NEGATIVE
Comment: NORMAL
Neisseria Gonorrhea: NEGATIVE
Trichomonas: NEGATIVE

## 2021-05-08 MED ORDER — AZITHROMYCIN 500 MG PO TABS: 1000.0000 mg | ORAL_TABLET | Freq: Once | ORAL | 0 refills | Status: AC

## 2021-05-08 MED ORDER — FLUCONAZOLE 150 MG PO TABS: 150.0000 mg | ORAL_TABLET | Freq: Once | ORAL | 0 refills | Status: AC

## 2021-05-08 NOTE — Progress Notes (Signed)
Pls notify [facility]. Thx

## 2021-05-08 NOTE — Progress Notes (Signed)
[facility] notified. 

## 2021-05-08 NOTE — Telephone Encounter (Signed)
Pt aware of chlamydia and yeast on culture. Rx azithro/diflucan eRxd. Partner needs tx, no sex until a wk after both completed tx. RTO in 4 wks for TOC. CMA to notify [facility].   Pt doing tx for UTI, having back pain, no fevers. Cont macrobid till tom (just started 04/3021). If back pain persist, will change abx. F/u prn. Still waiting for C&S results.    Meds ordered this encounter  Medications   azithromycin (ZITHROMAX) 500 MG tablet    Sig: Take 2 tablets (1,000 mg total) by mouth once for 1 dose.    Dispense:  2 tablet    Refill:  0    Order Specific Question:   Supervising Provider    Answer:   Nadara Mustard [[id number]]   fluconazole (DIFLUCAN) 150 MG tablet    Sig: Take 1 tablet (150 mg total) by mouth once for 1 dose.    Dispense:  1 tablet    Refill:  0    Order Specific Question:   Supervising Provider    Answer:   Nadara Mustard [[id number]]

## 2021-05-09 ENCOUNTER — Encounter: Payer: Self-pay | Admitting: Obstetrics and Gynecology

## 2021-05-09 LAB — URINE CULTURE

## 2021-05-10 NOTE — Telephone Encounter (Signed)
Pt's C&S confirmed UTI and is on the correct abx. Should be feeling better. Any other sx other than back pain?

## 2021-05-16 ENCOUNTER — Other Ambulatory Visit: Payer: Self-pay

## 2021-05-16 ENCOUNTER — Encounter: Payer: Self-pay | Admitting: Psychiatry

## 2021-05-16 ENCOUNTER — Telehealth (INDEPENDENT_AMBULATORY_CARE_PROVIDER_SITE_OTHER): Payer: 59 | Admitting: Psychiatry

## 2021-05-16 DIAGNOSIS — F1290 Cannabis use, unspecified, uncomplicated: Secondary | ICD-10-CM

## 2021-05-16 DIAGNOSIS — F411 Generalized anxiety disorder: Secondary | ICD-10-CM

## 2021-05-16 DIAGNOSIS — F319 Bipolar disorder, unspecified: Secondary | ICD-10-CM

## 2021-05-16 MED ORDER — ARIPIPRAZOLE 5 MG PO TABS: 5.0000 mg | ORAL_TABLET | Freq: Every day | ORAL | 0 refills | Status: DC

## 2021-05-16 NOTE — Progress Notes (Signed)
Virtual Visit via Video Note  I connected with Allison Black on 05/16/21 at  3:00 PM EST by a video enabled telemedicine application and verified that I am speaking with the correct person using two identifiers. Location Provider Location : [facility] Patient Location : Car  Participants: Patient , Provider   I discussed the limitations of evaluation and management by telemedicine and the availability of in person appointments. The patient expressed understanding and agreed to proceed.     I discussed the assessment and treatment plan with the patient. The patient was provided an opportunity to ask questions and all were answered. The patient agreed with the plan and demonstrated an understanding of the instructions.   The patient was advised to call back or seek an in-person evaluation if the symptoms worsen or if the condition fails to improve as anticipated.   BH MD OP Progress Note  05/16/2021 3:25 PM Allison Black  MRN:  [id number]  Chief Complaint:  Chief Complaint   Follow-up 26 year old Caucasian female, has a history of bipolar and related disorder, GAD, long-term use of cannabis, fibromyalgia was evaluated for medication management.    HPI: Allison Black is a 26 year old Caucasian female, employed, lives in [place], separated, has a history of bipolar and related disorder, GAD, long-term use of cannabis, fibromyalgia was evaluated by telemedicine today.  Patient today reports she is currently compliant on the Abilify.  She reports she does have reduced appetite however not sure if this is related .  Per review of previous progress notes, visit from 03/27/2021-patient did have appetite reduction at that time however she was not on Abilify.  Likely not due to the Abilify.  Denies any other side effects.  Patient continues to have some mood lability, irritability, low mood, anxiety symptoms although improving on the current medication regimen.  She reports she had to call out of  work yesterday since she felt irritable when she woke up and did not want to be around people.  Patient reports overall sleep is okay.  Patient denies any suicidality, homicidality or perceptual disturbances.  Patient looks forward to her appointment with Ms. Christina Hussami -on February 15.  Denies any other concerns today.  Visit Diagnosis:    ICD-10-CM   1. Bipolar and related disorder (HCC)  F31.9 ARIPiprazole (ABILIFY) 5 MG tablet   unspecified    2. GAD (generalized anxiety disorder)  F41.1     3. Long term current use of cannabis  F12.90       Past Psychiatric History: Reviewed past psychiatric history from progress note on 03/27/2021.  Past trials of Prozac, gabapentin-fibromyalgia, propranolol.  Past Medical History:  Past Medical History:  Diagnosis Date   Asthma    Heart murmur    Panic attack     Past Surgical History:  Procedure Laterality Date   NO PAST SURGERIES      Family Psychiatric History: Reviewed family psychiatric history from progress note on 03/27/2021.  Family History:  Family History  Problem Relation Age of Onset   Bipolar disorder Mother    Lupus Mother    Mental illness Sister    Bipolar disorder Maternal Aunt    Bipolar disorder Maternal Aunt    Bipolar disorder Maternal Aunt    Schizophrenia Maternal Aunt    Suicidality Maternal Aunt    Diabetes Maternal Grandmother    Diabetes Cousin     Social History: Reviewed social history from progress note on 03/27/2021. Social History   Socioeconomic History   Marital  status: Married    Spouse name: [name]   Number of children: 2   Years of education: Not on file   Highest education level: Not on file  Occupational History   Not on file  Tobacco Use   Smoking status: Former   Smokeless tobacco: Never  Vaping Use   Vaping Use: Never used  Substance and Sexual Activity   Alcohol use: Yes    Alcohol/week: 1.0 standard drink    Types: 1 Shots of liquor per week    Comment:  1-2 shots a few times a month   Drug use: Yes    Frequency: 28.0 times per week    Types: Marijuana    Comment: last used 10/20/19   Sexual activity: Yes    Birth control/protection: None, Condom  Other Topics Concern   Not on file  Social History Narrative   Son - [name]    Daughter - [name]   Social Determinants of Health   Financial Resource Strain: Not on file  Food Insecurity: Not on file  Transportation Needs: Not on file  Physical Activity: Not on file  Stress: Not on file  Social Connections: Not on file    Allergies: No Known Allergies  Metabolic Disorder Labs: No results found for: HGBA1C, MPG No results found for: PROLACTIN No results found for: CHOL, TRIG, HDL, CHOLHDL, VLDL, LDLCALC No results found for: TSH  Therapeutic Level Labs: No results found for: LITHIUM No results found for: VALPROATE No components found for:  CBMZ  Current Medications: Current Outpatient Medications  Medication Sig Dispense Refill   albuterol (VENTOLIN HFA) 108 (90 Base) MCG/ACT inhaler Inhale into the lungs.     ARIPiprazole (ABILIFY) 5 MG tablet Take 1 tablet (5 mg total) by mouth daily. 30 tablet 0   fluconazole (DIFLUCAN) 150 MG tablet Take 150 mg by mouth once.     FLUoxetine (PROZAC) 20 MG capsule Take 1 capsule (20 mg total) by mouth daily with breakfast. 90 capsule 0   gabapentin (NEURONTIN) 300 MG capsule Take 1 capsule (300 mg total) by mouth 3 (three) times daily. 90 capsule 1   hydrOXYzine (ATARAX) 10 MG tablet Take 1 tablet (10 mg total) by mouth 3 (three) times daily as needed for anxiety. 90 tablet 1   phenazopyridine (PYRIDIUM) 200 MG tablet Take 1 tablet (200 mg total) by mouth 3 (three) times daily as needed for pain (urethral spasm). 10 tablet 0   azithromycin (ZITHROMAX) 500 MG tablet Take 1,000 mg by mouth once. (Patient not taking: Reported on 05/16/2021)     No current facility-administered medications for this visit.     Musculoskeletal: Strength &  Muscle Tone:  UTA Gait & Station:  Seated Patient leans: N/A  Psychiatric Specialty Exam: Review of Systems  Constitutional:  Positive for appetite change.  Psychiatric/Behavioral:  Positive for decreased concentration and dysphoric mood. The patient is nervous/anxious.   All other systems reviewed and are negative.  Last menstrual period 04/27/2021.There is no height or weight on file to calculate BMI.  General Appearance: Casual  Eye Contact:  Fair  Speech:  Clear and Coherent  Volume:  Normal  Mood:  Anxious and Depressed  Affect:  Congruent  Thought Process:  Goal Directed and Descriptions of Associations: Intact  Orientation:  Full (Time, Place, and Person)  Thought Content: Logical   Suicidal Thoughts:  No  Homicidal Thoughts:  No  Memory:  Immediate;   Fair Recent;   Fair Remote;   Fair  Judgement:  Fair  Insight:  Fair  Psychomotor Activity:  Normal  Concentration:  Concentration: Fair and Attention Span: Fair  Recall:  [employer] of Knowledge: Fair  Language: Fair  Akathisia:  No  Handed:  Right  AIMS (if indicated): done, 0  Assets:  Communication Skills Desire for Improvement Housing Social Support Talents/Skills  ADL's:  Intact  Cognition: WNL  Sleep:  Fair   Screenings: GAD-7    Flowsheet Row Video Visit from 05/16/2021 in [facility] Video Visit from 04/23/2021 in [facility] Office Visit from 01/06/2018 in [facility]  Total GAD-7 Score 11 19 20       PHQ2-9    Flowsheet Row Video Visit from 05/16/2021 in [facility] Video Visit from 04/23/2021 in [facility] Office Visit from 03/27/2021 in [facility] Office Visit from 01/06/2018 in [facility]  PHQ-2 Total Score 2 6 6 4   PHQ-9 Total Score 8 24 25 18       Flowsheet Row Video Visit from 05/16/2021 in [facility] Video Visit from 04/23/2021 in [facility] Office Visit from 03/27/2021 in [facility]  C-SSRS RISK CATEGORY No Risk Low Risk No Risk        Assessment and Plan: Allison Black is a 26 year old Caucasian female, employed, separated, has a history of bipolar unspecified, fibromyalgia, anxiety, abdominal pain was evaluated by telemedicine today.  Patient continues to have mood swings, will benefit from the following medication readjustment.  Plan Bipolar disorder unspecified-rule out bipolar type I versus secondary to substance abuse-cannabis-unstable Continue gabapentin 300 mg p.o. 3 times daily Prozac at lower dosage of 20 mg p.o. daily Increase Abilify to 5 mg p.o. daily   GAD-unstable Prozac 20 mg p.o. daily Hydroxyzine 10 mg p.o. 3 times daily as needed for anxiety attacks Patient has upcoming appointment with psychotherapist- Ms.Christina Hussami  Long-term use of cannabis-rule out cannabis use disorder-unstable Provided education. Patient believes cannabis helps with her chronic abdominal pain. Provided education about drug to drug interaction.  At risk for prolonged QT syndrome-reviewed and discussed EKG-dated May 04, [id number].  Patient with appetite change, ongoing since the past several months.  Agrees to follow up with primary care provider for evaluation.  Follow-up in clinic in 1 month or sooner if needed in person.  This note was generated in part or whole with voice recognition software. Voice recognition is usually quite accurate but there are transcription errors that can and very often do occur. I apologize for any typographical errors that were not detected and corrected.     Jomarie Longs, MD 05/17/2021, 2:02 PM

## 2021-05-22 ENCOUNTER — Other Ambulatory Visit: Payer: Self-pay

## 2021-05-22 ENCOUNTER — Ambulatory Visit (INDEPENDENT_AMBULATORY_CARE_PROVIDER_SITE_OTHER): Payer: 59 | Admitting: Licensed Clinical Social Worker

## 2021-05-22 DIAGNOSIS — F411 Generalized anxiety disorder: Secondary | ICD-10-CM | POA: Diagnosis not present

## 2021-05-22 DIAGNOSIS — F319 Bipolar disorder, unspecified: Secondary | ICD-10-CM | POA: Diagnosis not present

## 2021-05-22 NOTE — Plan of Care (Signed)
Developed treatment plan with pt input ?
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
## 2021-06-11 ENCOUNTER — Other Ambulatory Visit: Payer: Self-pay

## 2021-06-11 ENCOUNTER — Encounter: Payer: Self-pay | Admitting: Obstetrics and Gynecology

## 2021-06-11 ENCOUNTER — Ambulatory Visit (INDEPENDENT_AMBULATORY_CARE_PROVIDER_SITE_OTHER): Payer: 59 | Admitting: Obstetrics and Gynecology

## 2021-06-11 ENCOUNTER — Other Ambulatory Visit (HOSPITAL_COMMUNITY)
Admission: RE | Admit: 2021-06-11 | Discharge: 2021-06-11 | Disposition: A | Discharge status: Home / Self Care | Payer: 59 | Source: Ambulatory Visit | Attending: Obstetrics and Gynecology | Admitting: Obstetrics and Gynecology

## 2021-06-11 VITALS — BP 90/60 | Ht 62.0 in | Wt 121.0 lb

## 2021-06-11 DIAGNOSIS — A749 Chlamydial infection, unspecified: Secondary | ICD-10-CM | POA: Insufficient documentation

## 2021-06-11 DIAGNOSIS — N898 Other specified noninflammatory disorders of vagina: Secondary | ICD-10-CM | POA: Diagnosis not present

## 2021-06-11 DIAGNOSIS — Z113 Encounter for screening for infections with a predominantly sexual mode of transmission: Secondary | ICD-10-CM

## 2021-06-11 LAB — POCT WET PREP WITH KOH
Clue Cells Wet Prep HPF POC: NEGATIVE
KOH Prep POC: NEGATIVE
Trichomonas, UA: NEGATIVE
Yeast Wet Prep HPF POC: NEGATIVE

## 2021-06-11 NOTE — Progress Notes (Signed)
? ? ?Pcp, No ? ? ?Chief Complaint  ?Patient presents with  ? Follow-up  ?  TOC, Still having irritation, discharge  ? ? ?HPI: ?     [name] is a 26 y.o. [id number] whose LMP was Patient's last menstrual period was 05/25/2021 (exact date)., presents today for chlamydia TOC. Pt diagnosed with yeast and chlamydia at 05/06/21 appt. Treated with diflucan and azithromycin; pt no longer with that partner after dx, no new sex partners. D/c and irritation improved after tx but having sx again for a couple wks. Hx of recurrent yeast vag and BV sx (confirmed on cultures) since birth of last child. Pt washes with water, no dryer sheets, regular cotton underwear, no wipes, no pantyliner use. Has tried boric acid supp with some relief recently. Not taking probiotics.  ?Also had UTI 05/06/21, treated with macrobid, confirmed sensitivity on C&S. Doing better.  ? ? ?Patient Active Problem List  ? Diagnosis Date Noted  ? Acute cystitis with hematuria 05/06/2021  ? Long term current use of cannabis 04/23/2021  ? At risk for prolonged QT interval syndrome 04/23/2021  ? Noncompliance with treatment plan 04/23/2021  ? Bipolar and related disorder (HCC) 03/27/2021  ? GAD (generalized anxiety disorder) 03/27/2021  ? Cannabis use disorder, moderate, dependence (HCC) 03/27/2021  ? Asthma 02/10/2017  ? Need for immunization against influenza 01/27/2017  ? Episodic lightheadedness 12/19/2016  ? Depression with anxiety 10/09/2016  ? ? ?Past Surgical History:  ?Procedure Laterality Date  ? NO PAST SURGERIES    ? ? ?Family History  ?Problem Relation Age of Onset  ? Bipolar disorder Mother   ? Lupus Mother   ? Mental illness Sister   ? Bipolar disorder Maternal Aunt   ? Bipolar disorder Maternal Aunt   ? Bipolar disorder Maternal Aunt   ? Schizophrenia Maternal Aunt   ? Suicidality Maternal Aunt   ? Diabetes Maternal Grandmother   ? Diabetes Cousin   ? ? ?Social History  ? ?Socioeconomic History  ? Marital status: Married  ?  Spouse name:  [name]  ? Number of children: 2  ? Years of education: Not on file  ? Highest education level: Not on file  ?Occupational History  ? Not on file  ?Tobacco Use  ? Smoking status: Former  ? Smokeless tobacco: Never  ?Vaping Use  ? Vaping Use: Never used  ?Substance and Sexual Activity  ? Alcohol use: Yes  ?  Alcohol/week: 1.0 standard drink  ?  Types: 1 Shots of liquor per week  ?  Comment: 1-2 shots a few times a month  ? Drug use: Yes  ?  Frequency: 28.0 times per week  ?  Types: Marijuana  ?  Comment: last used 10/20/19  ? Sexual activity: Not Currently  ?  Birth control/protection: None, Condom  ?Other Topics Concern  ? Not on file  ?Social History Narrative  ? Son - [name]   ? Daughter - [name]  ? ?Social Determinants of Health  ? ?Financial Resource Strain: Not on file  ?Food Insecurity: Not on file  ?Transportation Needs: Not on file  ?Physical Activity: Not on file  ?Stress: Not on file  ?Social Connections: Not on file  ?Intimate Partner Violence: Not on file  ? ? ?Outpatient Medications Prior to Visit  ?Medication Sig Dispense Refill  ? albuterol (VENTOLIN HFA) 108 (90 Base) MCG/ACT inhaler Inhale into the lungs.    ? ARIPiprazole (ABILIFY) 5 MG tablet Take 1 tablet (5 mg total) by  mouth daily. 30 tablet 0  ? fluconazole (DIFLUCAN) 150 MG tablet Take 150 mg by mouth once.    ? FLUoxetine (PROZAC) 20 MG capsule Take 1 capsule (20 mg total) by mouth daily with breakfast. 90 capsule 0  ? gabapentin (NEURONTIN) 300 MG capsule Take 1 capsule (300 mg total) by mouth 3 (three) times daily. 90 capsule 1  ? hydrOXYzine (ATARAX) 10 MG tablet Take 1 tablet (10 mg total) by mouth 3 (three) times daily as needed for anxiety. 90 tablet 1  ? azithromycin (ZITHROMAX) 500 MG tablet Take 1,000 mg by mouth once. (Patient not taking: Reported on 05/16/2021)    ? phenazopyridine (PYRIDIUM) 200 MG tablet Take 1 tablet (200 mg total) by mouth 3 (three) times daily as needed for pain (urethral spasm). 10 tablet 0  ? ?No  facility-administered medications prior to visit.  ? ? ? ? ?ROS: ? ?Review of Systems  ?Constitutional:  Negative for fever.  ?Gastrointestinal:  Negative for blood in stool, constipation, diarrhea, nausea and vomiting.  ?Genitourinary:  Positive for vaginal discharge. Negative for dyspareunia, dysuria, flank pain, frequency, hematuria, urgency, vaginal bleeding and vaginal pain.  ?Musculoskeletal:  Negative for back pain.  ?Skin:  Negative for rash.  ?BREAST: No symptoms ? ? ?OBJECTIVE:  ? ?Vitals:  ?BP 90/60   Ht 5\' 2"  (1.575 m)   Wt 121 lb (54.9 kg)   LMP 05/25/2021 (Exact Date)   BMI 22.13 kg/m?  ? ?Physical Exam ?Vitals reviewed.  ?Constitutional:   ?   Appearance: She is well-developed.  ?Pulmonary:  ?   Effort: Pulmonary effort is normal.  ?Genitourinary: ?   General: Normal vulva.  ?   Pubic Area: No rash.   ?   Labia:     ?   Right: No rash, tenderness or lesion.     ?   Left: No rash, tenderness or lesion.   ?   Vagina: Vaginal discharge present. No erythema or tenderness.  ?   Cervix: Normal.  ?   Uterus: Normal. Not enlarged and not tender.   ?   Adnexa: Right adnexa normal and left adnexa normal.    ?   Right: No mass or tenderness.      ?   Left: No mass or tenderness.    ?Musculoskeletal:     ?   General: Normal range of motion.  ?   Cervical back: Normal range of motion.  ?Skin: ?   General: Skin is warm and dry.  ?Neurological:  ?   General: No focal deficit present.  ?   Mental Status: She is alert and oriented to person, place, and time.  ?Psychiatric:     ?   Mood and Affect: Mood normal.     ?   Behavior: Behavior normal.     ?   Thought Content: Thought content normal.     ?   Judgment: Judgment normal.  ? ? ?Results: ?Results for orders placed or performed in visit on 06/11/21 (from the past 24 hour(s))  ?POCT Wet Prep with KOH     Status: Normal  ? Collection Time: 06/11/21  3:47 PM  ?Result Value Ref Range  ? Trichomonas, UA Negative   ? Clue Cells Wet Prep HPF POC neg   ? Epithelial Wet  Prep HPF POC    ? Yeast Wet Prep HPF POC neg   ? Bacteria Wet Prep HPF POC    ? RBC Wet Prep HPF POC    ?  WBC Wet Prep HPF POC    ? KOH Prep POC Negative Negative  ? ? ? ?Assessment/Plan: ?Chlamydia - Plan: Cervicovaginal ancillary only, TOC today. Will f/u with results.  ? ?Screening for STD (sexually transmitted disease) - Plan: Cervicovaginal ancillary only,  ? ?Vaginal discharge - Plan: Cervicovaginal ancillary only, POCT Wet Prep with KOH, pos sx/exam and neg wet prep. Check culture again. If pos for yeast, will start wkly diflucan for 3-4 months as preventive. Add probiotics.  ? ? ? Return if symptoms worsen or fail to improve. ? ?Hendricks Schwandt B. Adeline Petitfrere, PA-C ?06/11/2021 ?3:48 PM ? ? ? ? ? ?

## 2021-06-13 ENCOUNTER — Other Ambulatory Visit: Payer: Self-pay

## 2021-06-13 ENCOUNTER — Encounter: Payer: Self-pay | Admitting: Psychiatry

## 2021-06-13 ENCOUNTER — Ambulatory Visit (INDEPENDENT_AMBULATORY_CARE_PROVIDER_SITE_OTHER): Payer: 59 | Admitting: Psychiatry

## 2021-06-13 VITALS — BP 103/70 | HR 75 | Temp 98.3°F | Wt 119.4 lb

## 2021-06-13 DIAGNOSIS — F411 Generalized anxiety disorder: Secondary | ICD-10-CM

## 2021-06-13 DIAGNOSIS — Z79899 Other long term (current) drug therapy: Secondary | ICD-10-CM | POA: Diagnosis not present

## 2021-06-13 DIAGNOSIS — F319 Bipolar disorder, unspecified: Secondary | ICD-10-CM | POA: Diagnosis not present

## 2021-06-13 DIAGNOSIS — F1290 Cannabis use, unspecified, uncomplicated: Secondary | ICD-10-CM | POA: Diagnosis not present

## 2021-06-13 LAB — CERVICOVAGINAL ANCILLARY ONLY
Bacterial Vaginitis (gardnerella): NEGATIVE
Candida Glabrata: NEGATIVE
Candida Vaginitis: NEGATIVE
Chlamydia: NEGATIVE
Comment: NEGATIVE
Comment: NEGATIVE
Comment: NEGATIVE
Comment: NEGATIVE
Comment: NEGATIVE
Comment: NORMAL
Neisseria Gonorrhea: NEGATIVE
Trichomonas: NEGATIVE

## 2021-06-13 MED ORDER — FLUOXETINE HCL 20 MG PO CAPS: 20.0000 mg | ORAL_CAPSULE | Freq: Every day | ORAL | 0 refills | Status: DC

## 2021-06-13 MED ORDER — ARIPIPRAZOLE 5 MG PO TABS: 5.0000 mg | ORAL_TABLET | Freq: Every day | ORAL | 1 refills | Status: DC

## 2021-06-13 MED ORDER — GABAPENTIN 300 MG PO CAPS: 300.0000 mg | ORAL_CAPSULE | ORAL | 1 refills | Status: DC

## 2021-06-13 MED ORDER — HYDROXYZINE HCL 10 MG PO TABS: 10.0000 mg | ORAL_TABLET | Freq: Three times a day (TID) | ORAL | 1 refills | Status: DC | PRN

## 2021-06-13 NOTE — Progress Notes (Signed)
BH MD OP Progress Note  06/13/2021 9:19 AM Allison Black  MRN:  [id number]  Chief Complaint:  Chief Complaint  Patient presents with   Follow-up: 26 year old Caucasian female with history of bipolar and related disorder, GAD, long-term use of cannabis, fibromyalgia was evaluated for medication management.   HPI: Allison Black is a 26 year old Caucasian female, employed, lives in [place], separated, has a history of bipolar and related disorder, GAD, long-term use of cannabis, fibromyalgia was evaluated in office today.  Patient today reports since being on the Abilify higher dosage her mood symptoms have improved a lot.  She continues to have mild anxiety, irritability.  She mainly struggles with her appetite.  Patient reports she has to force herself to eat most days.  She reports she does not feel hungry.  She started drinking boost to supplement.  She may have lost 7 pounds in the past 3 months.  Patient also reports she continues to struggle with pain, generalized body aches, headaches, has a history of fibromyalgia.  Patient reports the pain does have an impact on her mood as well as her sleep.  Patient is currently cutting back on cannabis use.  Denies any suicidality, homicidality or perceptual disturbances.  Denies side effects to medications.  Denies any other concerns today.    Visit Diagnosis:    ICD-10-CM   1. Bipolar and related disorder (HCC)  F31.9 ARIPiprazole (ABILIFY) 5 MG tablet   unspecified    2. GAD (generalized anxiety disorder)  F41.1 hydrOXYzine (ATARAX) 10 MG tablet    gabapentin (NEURONTIN) 300 MG capsule    FLUoxetine (PROZAC) 20 MG capsule    3. Long term current use of cannabis  F12.90     4. High risk medication use  Z79.899       Past Psychiatric History: Reviewed past psychiatric history from progress note from 03/27/2021.  Past trials of Prozac, gabapentin-fibromyalgia, propranolol  Past Medical History:  Past Medical History:  Diagnosis  Date   Asthma    Heart murmur    Panic attack     Past Surgical History:  Procedure Laterality Date   NO PAST SURGERIES      Family Psychiatric History: Reviewed family psychiatric history from progress note on 03/27/2021.  Family History:  Family History  Problem Relation Age of Onset   Bipolar disorder Mother    Lupus Mother    Mental illness Sister    Bipolar disorder Maternal Aunt    Bipolar disorder Maternal Aunt    Bipolar disorder Maternal Aunt    Schizophrenia Maternal Aunt    Suicidality Maternal Aunt    Diabetes Maternal Grandmother    Diabetes Cousin     Social History: Reviewed social history from progress note on 03/27/2021. Social History   Socioeconomic History   Marital status: Married    Spouse name: [name]   Number of children: 2   Years of education: Not on file   Highest education level: Not on file  Occupational History   Not on file  Tobacco Use   Smoking status: Former   Smokeless tobacco: Never  Vaping Use   Vaping Use: Never used  Substance and Sexual Activity   Alcohol use: Yes    Alcohol/week: 1.0 standard drink    Types: 1 Shots of liquor per week    Comment: 1-2 shots a few times a month   Drug use: Yes    Frequency: 28.0 times per week    Types: Marijuana    Comment: last  used 10/20/19   Sexual activity: Not Currently    Birth control/protection: None, Condom  Other Topics Concern   Not on file  Social History Narrative   Son - [name]    Daughter - [place]   Social Determinants of Health   Financial Resource Strain: Not on file  Food Insecurity: Not on file  Transportation Needs: Not on file  Physical Activity: Not on file  Stress: Not on file  Social Connections: Not on file    Allergies: No Known Allergies  Metabolic Disorder Labs: No results found for: HGBA1C, MPG No results found for: PROLACTIN No results found for: CHOL, TRIG, HDL, CHOLHDL, VLDL, LDLCALC No results found for: TSH  Therapeutic Level  Labs: No results found for: LITHIUM No results found for: VALPROATE No components found for:  CBMZ  Current Medications: Current Outpatient Medications  Medication Sig Dispense Refill   albuterol (VENTOLIN HFA) 108 (90 Base) MCG/ACT inhaler Inhale into the lungs.     fluconazole (DIFLUCAN) 150 MG tablet Take 150 mg by mouth once.     ARIPiprazole (ABILIFY) 5 MG tablet Take 1 tablet (5 mg total) by mouth daily. 30 tablet 1   FLUoxetine (PROZAC) 20 MG capsule Take 1 capsule (20 mg total) by mouth daily with breakfast. 90 capsule 0   gabapentin (NEURONTIN) 300 MG capsule Take 1 capsule (300 mg total) by mouth as directed. Take 1 capsule daily at 7 AM , 1 cap at 1 PM , 2 Caps at bedtime 120 capsule 1   hydrOXYzine (ATARAX) 10 MG tablet Take 1 tablet (10 mg total) by mouth 3 (three) times daily as needed for anxiety. 90 tablet 1   No current facility-administered medications for this visit.     Musculoskeletal: Strength & Muscle Tone: within normal limits Gait & Station: normal Patient leans: N/A  Psychiatric Specialty Exam: Review of Systems  Musculoskeletal:  Positive for myalgias.  Psychiatric/Behavioral:  Positive for sleep disturbance.        Mood swings  All other systems reviewed and are negative.  Blood pressure 103/70, pulse 75, temperature 98.3 F (36.8 C), temperature source Temporal, weight 119 lb 6.4 oz (54.2 kg), last menstrual period 05/25/2021.Body mass index is 21.84 kg/m.  General Appearance: Casual  Eye Contact:  Fair  Speech:  Clear and Coherent  Volume:  Normal  Mood:   mood swings  Affect:  Full Range  Thought Process:  Goal Directed and Descriptions of Associations: Intact  Orientation:  Full (Time, Place, and Person)  Thought Content: Logical   Suicidal Thoughts:  No  Homicidal Thoughts:  No  Memory:  Immediate;   Fair Recent;   Fair Remote;   Fair  Judgement:  Fair  Insight:  Fair  Psychomotor Activity:  Normal  Concentration:  Concentration: Fair  and Attention Span: Fair  Recall:  FiservFair  Fund of Knowledge: Fair  Language: Fair  Akathisia:  No  Handed:  Right  AIMS (if indicated): done  Assets:  Communication Skills Desire for Improvement Housing Social Support Talents/Skills Transportation  ADL's:  Intact  Cognition: WNL  Sleep:  Poor   Screenings: AIMS    Flowsheet Row [facility]  AIMS Total Score 0      GAD-7    Flowsheet Row Counselor from [facility]22[facility]20[facility] in [facility] Video Visit from [facility]16/2021 [facility] Video Visit from [facility]/23/2021 in [facility]  Total GAD-7 Score 16 11 19 20       PHQ2-9    Flowsheet Row Office Visit from 06/13/2021 in [facility] Counselor from 05/22/2021 in [facility] Video Visit from 05/16/2021 in [facility] Video Visit from 04/23/2021 in [facility] Office Visit from 03/27/2021 in [facility]  PHQ-2 Total Score 2 5 2 6 6   PHQ-9 Total Score 17 21 8 24 25       Flowsheet Row Office Visit from 06/13/2021 in [facility] Video Visit from 05/16/2021 in [facility] Video Visit from 04/23/2021 in [facility]  C-SSRS RISK CATEGORY No Risk No Risk Low Risk        Assessment and Plan: Wandy Bossler is a 26 year old Caucasian female, employed, separated, has a history of bipolar disorder, fibromyalgia, anxiety, abdominal pain was evaluated in office today.  Patient with some improvement with regards to her mood on the Abilify however continues to have pain likely from fibromyalgia which does have an impact on her mood and sleep, will benefit from the following plan.  Plan Bipolar  disorder unspecified-rule out bipolar type I versus secondary to substance abuse-cannabis-improving Increase gabapentin to 300 mg p.o. twice daily and 600 mg at bedtime Prozac 20 mg p.o. daily Abilify 5 mg p.o. daily.   GAD-improving Prozac 20 mg p.o. daily Hydroxyzine 10 mg p.o. 3 times daily as needed for anxiety attacks Increased to gabapentin as noted above Continue CBT with Ms. Christina Hussami  Long-term use of cannabis-rule out cannabis use disorder-improving Patient is currently cutting back.  Provided counseling.   High risk medication use-will reorder TSH and UDS-patient provided lab slip.  [facility] to [facility] to [facility] to get it done.  Patient with weight loss-advised to follow up with primary care provider.  She may need a referral to a dietitian or nutritionist.  Follow-up in clinic in 4 to 6 weeks or sooner if needed.  Collaboration of Care: Collaboration of Care: Primary Care Provider AEB encouraged to follow up with primary care provider for weight loss and Referral or follow-up with counselor/therapist AEB continue psychotherapy sessions, encouraged to do so.  Has upcoming appointment with our therapist.  Patient/Guardian was advised Release of Information must be obtained prior to any record release in order to collaborate their care with an outside provider. Patient/Guardian was advised if they have not already done so to contact the registration department to sign all necessary forms in order for [facility] to release information regarding their care.   Consent: Patient/Guardian gives verbal consent for treatment and assignment of benefits for services provided during this visit. Patient/Guardian expressed understanding and agreed to proceed.   This note was generated in part or whole with voice recognition software. Voice recognition is usually quite accurate but there are transcription errors that can and very often do occur. I apologize for any typographical errors that were  not detected and corrected.      [name], MD 06/14/2021, 8:32 AM

## 2021-07-03 ENCOUNTER — Ambulatory Visit (INDEPENDENT_AMBULATORY_CARE_PROVIDER_SITE_OTHER): Payer: 59 | Admitting: Licensed Clinical Social Worker

## 2021-07-03 DIAGNOSIS — F411 Generalized anxiety disorder: Secondary | ICD-10-CM

## 2021-07-03 DIAGNOSIS — F319 Bipolar disorder, unspecified: Secondary | ICD-10-CM
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
## 2021-07-03 NOTE — Plan of Care (Signed)
?  Problem: Bipolar Disorder CCP Problem  1 Alleviate depressive/manic symptoms and return to improved levels of effective functioning per pt self report 3 out of 5 sessions documented.   ?Goal: LTG: Stabilize mood and increase goal-directed behavior: Input needed on appropriate metric ?Outcome: Progressing ?Note: Pt reporting mood more stable--less incidents with angry/impulsive outbursts ?Goal: STG: Allison Black WILL IDENTIFY COGNITIVE PATTERNS AND BELIEFS THAT INTERFERE WITH THERAPY ?Outcome: Progressing ?Goal: STG: [name] WILL ATTEND AT LEAST 80% OF SCHEDULED FOLLOW-UP PSYCHOTHERAPY APPOINTMENTS ?Outcome: Progressing ?Intervention: Encourage verbalization of feelings/concerns/expectations ?Intervention: Encourage patient to identify triggers ?Intervention: Manage signs of labile or escalating emotions ?Intervention: [name] ON COPE AHEAD SKILLS TO COPE WITH ANGER AROUSAL ?  ?

## 2021-07-29 ENCOUNTER — Ambulatory Visit (INDEPENDENT_AMBULATORY_CARE_PROVIDER_SITE_OTHER): Payer: 59 | Admitting: Obstetrics

## 2021-07-29 VITALS — BP 122/70 | Ht 62.0 in

## 2021-07-29 DIAGNOSIS — R300 Dysuria: Secondary | ICD-10-CM | POA: Diagnosis not present

## 2021-07-29 DIAGNOSIS — N926 Irregular menstruation, unspecified: Secondary | ICD-10-CM | POA: Diagnosis not present

## 2021-07-29 LAB — POCT URINALYSIS DIPSTICK
Bilirubin, UA: NEGATIVE
Blood, UA: NEGATIVE
Glucose, UA: NEGATIVE
Ketones, UA: NEGATIVE
Leukocytes, UA: NEGATIVE
Nitrite, UA: NEGATIVE
Protein, UA: NEGATIVE
Spec Grav, UA: 1.015 (ref 1.010–1.025)
Urobilinogen, UA: NEGATIVE E.U./dL — AB
pH, UA: 7 (ref 5.0–8.0)

## 2021-07-29 LAB — POCT URINE PREGNANCY: Preg Test, Ur: POSITIVE — AB

## 2021-07-29 NOTE — Progress Notes (Signed)
? ? ?Obstetrics & Gynecology Office Visit  ? ?Chief Complaint:  ?Chief Complaint  ?Patient presents with  ? Urinary Tract Infection  ?  Pt thinks she has UTI but also found out she was pregnant last night  ? ? ?History of Present Illness: Allison Black presents initially for UTI symptoms appointment, but on her arrival, shares that she found out she is pregnant yesterday,and thinks that she probably does not have a UTI. She was not using contraception, and she is not desirous of a pregnancy. ?She shares an LMP of 06/22/2021. Her cycles are regular, and she is concerned about paternity. Based on the dating, the FOB might be either one partner or  the man she is now dating. ?She is tearful during our visit.  Allison Black has a hx of mood issues for which seh takes several medications, and she has stopped those as of last evening. She does have a psychiatrist  who follows her medications. ?She has looked up early DNA testing and wants to have paternity determined by a specialty lab- which is not offered by our [facility] staff at [facility]. ?  ?Review of Systems:  ?Review of Systems  ?Constitutional: Negative.   ?HENT: Negative.    ?Eyes: Negative.   ?Respiratory: Negative.    ?Cardiovascular: Negative.   ?Gastrointestinal: Negative.   ?Genitourinary: Negative.   ?Musculoskeletal: Negative.   ?Skin: Negative.   ?Neurological: Negative.   ?Endo/Heme/Allergies: Negative.   ?Psychiatric/Behavioral:  The patient is nervous/anxious.   ?     Emotional during the appointment- expressing both grief and anger.    ? ?Past Medical History:  ?Past Medical History:  ?Diagnosis Date  ? Asthma   ? Heart murmur   ? Panic attack   ? ? ?Past Surgical History:  ?Past Surgical History:  ?Procedure Laterality Date  ? NO PAST SURGERIES    ? ? ?Gynecologic History: Patient's last menstrual period was 06/22/2021. ? ?Obstetric History: [id number] ? ?Family History:  ?Family History  ?Problem Relation Age of Onset  ? Bipolar disorder Mother   ? Lupus Mother   ?  Mental illness Sister   ? Bipolar disorder Maternal Aunt   ? Bipolar disorder Maternal Aunt   ? Bipolar disorder Maternal Aunt   ? Schizophrenia Maternal Aunt   ? Suicidality Maternal Aunt   ? Diabetes Maternal Grandmother   ? Diabetes Cousin   ? ? ?Social History:  ?Social History  ? ?Socioeconomic History  ? Marital status: Married  ?  Spouse name: Allison Black  ? Number of children: 2  ? Years of education: Not on file  ? Highest education level: Not on file  ?Occupational History  ? Not on file  ?Tobacco Use  ? Smoking status: Former  ? Smokeless tobacco: Never  ?Vaping Use  ? Vaping Use: Never used  ?Substance and Sexual Activity  ? Alcohol use: Yes  ?  Alcohol/week: 1.0 standard drink  ?  Types: 1 Shots of liquor per week  ?  Comment: 1-2 shots a few times a month  ? Drug use: Yes  ?  Frequency: 28.0 times per week  ?  Types: Marijuana  ?  Comment: last used 10/20/19  ? Sexual activity: Not Currently  ?  Birth control/protection: None, Condom  ?Other Topics Concern  ? Not on file  ?Social History Narrative  ? Son - Allison Black   ? Daughter - Allison Black  ? ?Social Determinants of Health  ? ?Financial Resource Strain: Not on file  ?Food Insecurity: Not on  file  ?Transportation Needs: Not on file  ?Physical Activity: Not on file  ?Stress: Not on file  ?Social Connections: Not on file  ?Intimate Partner Violence: Not on file  ? ? ?Allergies:  ?No Known Allergies ? ?Medications: ?Prior to Admission medications   ?Medication Sig Start Date End Date Taking? Authorizing Provider  ?albuterol (VENTOLIN HFA) 108 (90 Base) MCG/ACT inhaler Inhale into the lungs. 01/01/21  Yes [provider]  ?ARIPiprazole (ABILIFY) 5 MG tablet Take 1 tablet (5 mg total) by mouth daily. 06/13/21  Yes Jomarie Longs, MD  ?fluconazole (DIFLUCAN) 150 MG tablet Take 150 mg by mouth once. 05/08/21  Yes [provider]  ?FLUoxetine (PROZAC) 20 MG capsule Take 1 capsule (20 mg total) by mouth daily with breakfast. 06/13/21  Yes Eappen, Levin Bacon, MD   ?gabapentin (NEURONTIN) 300 MG capsule Take 1 capsule (300 mg total) by mouth as directed. Take 1 capsule daily at 7 AM , 1 cap at 1 PM , 2 Caps at bedtime 06/13/21  Yes Eappen, Levin Bacon, MD  ?hydrOXYzine (ATARAX) 10 MG tablet Take 1 tablet (10 mg total) by mouth 3 (three) times daily as needed for anxiety. 06/13/21  Yes Jomarie Longs, MD  ? ? ?Physical Exam ?Vitals:  ?Vitals:  ? 07/29/21 1606  ?BP: 122/70  ? ?Patient's last menstrual period was 06/22/2021. ? ?Physical Exam ?Constitutional:   ?   Appearance: She is normal weight.  ?HENT:  ?   Head: Normocephalic and atraumatic.  ?Cardiovascular:  ?   Rate and Rhythm: Normal rate and regular rhythm.  ?Pulmonary:  ?   Effort: Pulmonary effort is normal.  ?   Breath sounds: Normal breath sounds.  ?Genitourinary: ?   Comments: deferred ?Neurological:  ?   Mental Status: She is alert.  ?Psychiatric:  ?   Comments: Tearful throughout- expressing her intention to terminate the pregnancy if the FOB is not her present BF. Has not contacted her behavioral health provider and has stopped her psych medications.  ? ? ? ?Assessment: 26 y.o. [id number] with + UPT. ?Unknown paternity- pateint desires early testing. ? ?Plan: ?Problem List Items Addressed This Visit   ? ?  ? Other  ? Missed period - Primary  ? Relevant Orders  ? POCT urine pregnancy (Completed)  ? POCT Urinalysis Dipstick (Completed)  ? Beta hCG quant (ref lab)  ? ?Other Visit Diagnoses   ? ? Dysuria      ? Relevant Orders  ? POCT Urinalysis Dipstick (Completed)  ? ?  ?Emotional support offered. ?We discussed that she is likely [redacted] weeks gestation. Quantitative HCG ordered and will be drawn tomorrow. ?She declines a referral to a counseling center for support.Strongly desires paying an independent lab which would offer testing as early as 7 weeks. ?I have offer ed her a dating scan and have ordered this. Plan for follow up in several weeks. ?Strongly advised her to contact her Psychiatrist to discuss her medications as she  has stopped all of them- reminded her of the side effects that may occur with sudden withdrawal of these meds. She verbalizes understanding. ? ?Mirna Mires, CNM  ?07/29/2021 5:12 PM  ? ? ? ? ? ?

## 2021-07-30 ENCOUNTER — Other Ambulatory Visit: Payer: 59

## 2021-07-30 DIAGNOSIS — N926 Irregular menstruation, unspecified: Secondary | ICD-10-CM

## 2021-07-31 ENCOUNTER — Encounter: Payer: Self-pay | Admitting: Obstetrics

## 2021-07-31 ENCOUNTER — Ambulatory Visit (INDEPENDENT_AMBULATORY_CARE_PROVIDER_SITE_OTHER): Payer: 59

## 2021-07-31 DIAGNOSIS — Z3689 Encounter for other specified antenatal screening: Secondary | ICD-10-CM

## 2021-07-31 DIAGNOSIS — O0990 Supervision of high risk pregnancy, unspecified, unspecified trimester: Secondary | ICD-10-CM | POA: Insufficient documentation

## 2021-07-31 LAB — BETA HCG QUANT (REF LAB): hCG Quant: 6093 m[IU]/mL

## 2021-07-31 NOTE — Initial Assessments (Signed)
New OB Intake ? ?I connected with  Allison Black on 07/31/21 at  8:15 AM EDT by telephone Telephone Visit and verified that I am speaking with the correct person using two identifiers. Nurse is located at [facility] and pt is located at home. ? ?I explained I am completing New OB Intake today. We discussed her EDD of 03/29/2022 that is based on LMP of 06/22/2021. Pt is G4/P2012. I reviewed her allergies, medications, Medical/Surgical/OB history, and appropriate screenings. . Based on history, this is a/an  complicated pregnancy as pt has scored a 25 on [place], is bipolar, very down about pregnancy - wasn't planning on any more, previous babies were 9 lbs. ? ?Patient Active Problem List  ? Diagnosis Date Noted  ? Missed period 07/29/2021  ? High risk medication use 06/13/2021  ? Acute cystitis with hematuria 05/06/2021  ? Long term current use of cannabis 04/23/2021  ? At risk for prolonged QT interval syndrome 04/23/2021  ? Noncompliance with treatment plan 04/23/2021  ? Bipolar and related disorder (HCC) 03/27/2021  ? GAD (generalized anxiety disorder) 03/27/2021  ? Cannabis use disorder, moderate, dependence (HCC) 03/27/2021  ? Asthma 02/10/2017  ? Need for immunization against influenza 01/27/2017  ? Episodic lightheadedness 12/19/2016  ? Depression with anxiety 10/09/2016  ? ? ?Concerns addressed today ? ?Delivery Plans:  ?Plans to deliver at [facility].  ? ? [place] ?Explained first scheduled [place] will be scheduled at her first visit with provider. ? ?Labs ?Pt aware NOB labs will be drawn at first visit with provider.  Pt is wanting the blood test done so she can figure who the father is - says it needs to be drawn before 9w gestation. ? ?Patient has covid vaccine.   ? ?Social Determinants of Health ?Food Insecurity: Patient denies food insecurity.  ?Transportation: Patient denies transportation needs. ? ?Placed OB Box on problem list and updated ? ?First visit review ?I reviewed new  OB appt with pt. I explained she will have a pelvic exam, ob bloodwork with genetic screening, and PAP smear. Explained pt will be seen by Carie Caddy, CNM at first visit; encounter routed to appropriate provider. ?   ?Loran Senters, [facility]) ?07/31/2021  8:38 AM  ?

## 2021-07-31 NOTE — Progress Notes (Signed)
Patient seen for pregnancy confirmation. She is  uncertain of paternity, and plans to seek early paternity testing independently. Her quantitative value indicates approximately [redacted] weeks gestation. An ultrasound has been ordered for her as well. ? ?Allison Black, CNM  ?07/31/2021 8:58 PM  ? ?

## 2021-08-08 ENCOUNTER — Ambulatory Visit (INDEPENDENT_AMBULATORY_CARE_PROVIDER_SITE_OTHER): Payer: 59 | Admitting: Obstetrics and Gynecology

## 2021-08-08 ENCOUNTER — Encounter: Payer: Self-pay | Admitting: Obstetrics and Gynecology

## 2021-08-08 VITALS — BP 90/60 | Ht 62.0 in | Wt 127.0 lb

## 2021-08-08 DIAGNOSIS — Z3A01 Less than 8 weeks gestation of pregnancy: Secondary | ICD-10-CM | POA: Diagnosis not present

## 2021-08-08 DIAGNOSIS — O98911 Unspecified maternal infectious and parasitic disease complicating pregnancy, first trimester: Secondary | ICD-10-CM

## 2021-08-08 DIAGNOSIS — B3731 Acute candidiasis of vulva and vagina: Secondary | ICD-10-CM

## 2021-08-08 DIAGNOSIS — O219 Vomiting of pregnancy, unspecified: Secondary | ICD-10-CM

## 2021-08-08 LAB — POCT WET PREP WITH KOH
Clue Cells Wet Prep HPF POC: NEGATIVE
KOH Prep POC: NEGATIVE
Trichomonas, UA: NEGATIVE
Yeast Wet Prep HPF POC: POSITIVE

## 2021-08-08 MED ORDER — TERCONAZOLE 0.4 % VA CREA: 1.0000 | TOPICAL_CREAM | Freq: Every day | VAGINAL | 0 refills | Status: DC

## 2021-08-08 MED ORDER — PROMETHAZINE HCL 25 MG PO TABS: 25.0000 mg | ORAL_TABLET | Freq: Four times a day (QID) | ORAL | 2 refills | Status: DC | PRN
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
## 2021-08-08 NOTE — Patient Instructions (Signed)
I value your feedback and you entrusting us with your care. If you get a [facility] patient survey, I would appreciate you taking the time to let us know about your experience today. Thank you! ? ? ?

## 2021-08-15 ENCOUNTER — Ambulatory Visit (INDEPENDENT_AMBULATORY_CARE_PROVIDER_SITE_OTHER): Payer: 59 | Admitting: Obstetrics

## 2021-08-15 ENCOUNTER — Encounter: Payer: Self-pay | Admitting: Obstetrics and Gynecology

## 2021-08-15 ENCOUNTER — Encounter: Payer: Self-pay | Admitting: Obstetrics

## 2021-08-15 VITALS — BP 120/80 | Ht 63.0 in | Wt 133.0 lb

## 2021-08-15 DIAGNOSIS — O99341 Other mental disorders complicating pregnancy, first trimester: Secondary | ICD-10-CM | POA: Diagnosis not present

## 2021-08-15 DIAGNOSIS — O26859 Spotting complicating pregnancy, unspecified trimester: Secondary | ICD-10-CM

## 2021-08-15 DIAGNOSIS — F319 Bipolar disorder, unspecified: Secondary | ICD-10-CM | POA: Diagnosis not present

## 2021-08-15 DIAGNOSIS — O26851 Spotting complicating pregnancy, first trimester: Secondary | ICD-10-CM | POA: Diagnosis not present

## 2021-08-15 DIAGNOSIS — Z3A01 Less than 8 weeks gestation of pregnancy: Secondary | ICD-10-CM | POA: Diagnosis not present
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
## 2021-08-15 NOTE — Patient Instructions (Signed)
Congratulations on your good news! We want to welcome you to our practice. We want to make this an exciting and comfortable time for you. ? ?If you have any problems during your pregnancy, you may contact us at any time.  ? ?BLEEDING: Call the office with questions about bleeding.  ?NAUSEA AND VOMITING: we recommend the BRAT diet- bananas, rice, applesauce and toast. If needed, limit oral intake to clear liquids. Pyridoxine (Vitamin B6) 25 mg daily taken with Unisom (Doxylamine) 12.5 mg three times daily will help with nausea and vomiting in pregnancy. For Unisom, 12.5 mg is half of a scored pill. These are available over the counter. A prescription medicine Diclegis is available but most pharmacies require you to fail the over the counter medications first.  ?FEVER: Please take your temperature with a thermometer. If greater than 100.4, call the office. ?MEDICATIONS: Always ask your doctor about medications in pregnancy. It is safe to take Tylenol, Mucinex, Benadryl, Tums, Rolaids, and Mylanta in recommended dosages. You may use throat lozenges or sprays such as Chloraseptic and saline nasal spray. It is NOT safe to use Motrin/ Advil/ Ibuprofen. ?CONSTIPATION/ HEMORRHOIDS: we recommend increasing your water and fiber intake. May use fiber supplement. You may use  Milk of Magnesia, Colace. OTC hemorrhoid treatments like Preparation H and tucks pads are safe in pregnancy. This a common complaint and regular exercise will help. ?EXERCISE: We encouraged moderate exercise in uncomplicated pregnancies. Ask a question at your appointment if unsure.  ?TRAVEL: Travel is safe in uncomplicated pregnancies until the 37th week. It is important to exercise your legs frequently during travel and to stay hydrated by drinking water. Always have a safe emergency back up plan. Please inform [place] if you plan to travel.  ?LAMAZE: We encourage all of our patient to take the lamaze classes offered at the hospital. You will receive info  around 24 weeks. ?BREASTFEEDING: We encourage breastfeeding. There are lactation consultants at the hospital that can assist you after delivery. ? ?We recognize that there will be other concerns not addressed here. Please ask any questions during your appointment time or call the above numbers with problems or questions. ? ?

## 2021-08-15 NOTE — Telephone Encounter (Signed)
See mychart msg

## 2021-08-16 ENCOUNTER — Ambulatory Visit: Payer: 59

## 2021-08-16 ENCOUNTER — Ambulatory Visit
Admission: RE | Admit: 2021-08-16 | Discharge: 2021-08-16 | Disposition: A | Discharge status: Home / Self Care | Payer: 59 | Source: Ambulatory Visit | Attending: Obstetrics | Admitting: Obstetrics

## 2021-08-16 DIAGNOSIS — Z3A08 8 weeks gestation of pregnancy: Secondary | ICD-10-CM | POA: Diagnosis not present

## 2021-08-16 DIAGNOSIS — O26859 Spotting complicating pregnancy, unspecified trimester: Secondary | ICD-10-CM | POA: Diagnosis not present

## 2021-08-16 DIAGNOSIS — O26851 Spotting complicating pregnancy, first trimester: Secondary | ICD-10-CM | POA: Diagnosis not present

## 2021-08-19 ENCOUNTER — Telehealth: Payer: Self-pay | Admitting: Psychiatry

## 2021-08-19 ENCOUNTER — Encounter: Payer: Self-pay | Admitting: Psychiatry

## 2021-08-19 ENCOUNTER — Ambulatory Visit (INDEPENDENT_AMBULATORY_CARE_PROVIDER_SITE_OTHER): Payer: 59 | Admitting: Psychiatry

## 2021-08-19 ENCOUNTER — Encounter: Payer: 59 | Admitting: Licensed Practical Nurse

## 2021-08-19 VITALS — BP 92/60 | HR 70 | Temp 98.8°F | Wt 137.6 lb

## 2021-08-19 DIAGNOSIS — F1290 Cannabis use, unspecified, uncomplicated: Secondary | ICD-10-CM | POA: Diagnosis not present

## 2021-08-19 DIAGNOSIS — F411 Generalized anxiety disorder: Secondary | ICD-10-CM

## 2021-08-19 DIAGNOSIS — F3132 Bipolar disorder, current episode depressed, moderate: Secondary | ICD-10-CM | POA: Diagnosis not present

## 2021-08-19 DIAGNOSIS — F319 Bipolar disorder, unspecified: Secondary | ICD-10-CM

## 2021-08-19 MED ORDER — FLUOXETINE HCL 20 MG PO CAPS: 20.0000 mg | ORAL_CAPSULE | Freq: Every day | ORAL | 1 refills | Status: DC

## 2021-08-19 MED ORDER — ARIPIPRAZOLE 5 MG PO TABS: 2.5000 mg | ORAL_TABLET | Freq: Every day | ORAL | 1 refills | Status: DC

## 2021-08-19 NOTE — Telephone Encounter (Signed)
I have completed FMLA form-continuous for this patient-08/19/2021 - 09/04/2021. ? ?Will have Shanda Bumps CMA contact this patient to discuss that the form is completed as well as discuss that there will be co pay for completed FMLA forms. ? ? ?

## 2021-08-19 NOTE — Progress Notes (Signed)
BH MD OP Progress Note ? ?08/19/2021 2:06 PM ?Allison Black  ?MRN:  [id number] ? ?Chief Complaint:  ?Chief Complaint  ?Patient presents with  ? Follow-up: 26 year old Caucasian female with history of bipolar disorder, GAD, long-term use of cannabis, fibromyalgia, currently pregnant, presented for medication management.  ? ?HPI: Allison Black is a 26 year old Caucasian female, employed, lives in [place], separated, has a history of bipolar and related disorder, GAD, long-term use of cannabis, fibromyalgia was evaluated in office today. ? ?Patient currently pregnant, approximately 8 weeks, currently under the care of OB/GYN-Dr. Crawford-reviewed notes dated 08/15/2021. ? ?Patient reports she stopped taking her medications all of them 3 weeks ago.  Patient today reports she has been struggling with worsening mood symptoms, sadness, crying spells, irritability, fatigue, low energy, anxiety, worrying about things.  Patient also reports nausea likely due to being in first-trimester of her pregnancy, does report low appetite due to the same.  Patient wants to know what medications she can continue and what medications are safe during her pregnancy. ? ?Patient reports she constantly has thoughts about not wanting to wake up however denies any active suicidal thoughts or plans. ? ?Patient denies any perceptual disturbances, homicidality. ? ?Patient reports her boyfriend is supportive of the pregnancy, he is the dad.  Patient however is overwhelmed and reports this as an unplanned pregnancy.  She has 2 other children whom she shares custody with her ex-husband.  She reports she has no other social support system. ? ?Patient reports she is unable to function at work because of her irritability and mood swings and is interested in taking some time off from work. ? ? ? ?Visit Diagnosis:  ?  ICD-10-CM   ?1. Bipolar 1 disorder, depressed, moderate (HCC)  F31.32 ARIPiprazole (ABILIFY) 5 MG tablet  ?  ?2. GAD (generalized anxiety  disorder)  F41.1 FLUoxetine (PROZAC) 20 MG capsule  ?  ?3. Long term current use of cannabis  F12.90   ?  ? ? ?Past Psychiatric History: Reviewed past psychiatric history from progress note on 03/27/2021.  Past trials of Prozac, gabapentin-fibromyalgia, propranolol. ? ?Past Medical History:  ?Past Medical History:  ?Diagnosis Date  ? Asthma   ? Bipolar disorder (HCC)   ? Heart murmur   ? Panic attack   ?  ?Past Surgical History:  ?Procedure Laterality Date  ? NO PAST SURGERIES    ? ? ?Family Psychiatric History: Reviewed family psychiatric history from progress note on 03/27/2021. ? ?Family History:  ?Family History  ?Problem Relation Age of Onset  ? Bipolar disorder Mother   ? Lupus Mother   ? Mental illness Sister   ? Bipolar disorder Maternal Aunt   ? Bipolar disorder Maternal Aunt   ? Bipolar disorder Maternal Aunt   ? Schizophrenia Maternal Aunt   ? Suicidality Maternal Aunt   ? Diabetes Maternal Grandmother   ? Diabetes Cousin   ? ? ?Social History: Reviewed social history from progress note on 03/27/2021. ?Social History  ? ?Socioeconomic History  ? Marital status: Legally Separated  ?  Spouse name: [name]  ? Number of children: 2  ? Years of education: 2512  ? Highest education level: Not on file  ?Occupational History  ? Occupation: Copyneutrician tech;  ?  Employer: [employer]  ?  Comment: WORKS A SECOND JOB AS WELL  ?Tobacco Use  ? Smoking status: Former  ? Smokeless tobacco: Never  ?Vaping Use  ? Vaping Use: Never used  ?Substance and Sexual Activity  ? Alcohol use:  Not Currently  ?  Alcohol/week: 1.0 standard drink  ?  Types: 1 Shots of liquor per week  ?  Comment: 1-2 shots a few times a month  ? Drug use: Not Currently  ?  Frequency: 28.0 times per week  ?  Types: Marijuana  ?  Comment: last used 10/20/19  ? Sexual activity: Not Currently  ?  Birth control/protection: None  ?Other Topics Concern  ? Not on file  ?Social History Narrative  ? Son - [name]   ? Daughter - [name]  ? ?Social Determinants of  Health  ? ?Financial Resource Strain: Low Risk   ? Difficulty of Paying Living Expenses: Not hard at all  ?Food Insecurity: Food Insecurity Present  ? Worried About Programme researcher, broadcasting/film/video in the Last Year: Sometimes true  ? Ran Out of Food in the Last Year: Never true  ?Transportation Needs: No Transportation Needs  ? Lack of Transportation (Medical): No  ? Lack of Transportation (Non-Medical): No  ?Physical Activity: Inactive  ? Days of Exercise per Week: 0 days  ? Minutes of Exercise per Session: 0 min  ?Stress: Stress Concern Present  ? Feeling of Stress : Very much  ?Social Connections: Unknown  ? Frequency of Communication with Friends and Family: Once a week  ? Frequency of Social Gatherings with Friends and Family: Not on file  ? Attends Religious Services: Never  ? Active Member of Clubs or Organizations: Yes  ? Attends Banker Meetings: Never  ? Marital Status: Separated  ? ? ?Allergies: No Known Allergies ? ?Metabolic Disorder Labs: ?No results found for: HGBA1C, MPG ?No results found for: PROLACTIN ?No results found for: CHOL, TRIG, HDL, CHOLHDL, VLDL, LDLCALC ?No results found for: TSH ? ?Therapeutic Level Labs: ?No results found for: LITHIUM ?No results found for: VALPROATE ?No components found for:  CBMZ ? ?Current Medications: ?Current Outpatient Medications  ?Medication Sig Dispense Refill  ? albuterol (VENTOLIN HFA) 108 (90 Base) MCG/ACT inhaler Inhale into the lungs.    ? hydrOXYzine (ATARAX) 10 MG tablet Take 1 tablet (10 mg total) by mouth 3 (three) times daily as needed for anxiety. 90 tablet 1  ? promethazine (PHENERGAN) 25 MG tablet Take 1 tablet (25 mg total) by mouth every 6 (six) hours as needed for nausea or vomiting. 30 tablet 2  ? ARIPiprazole (ABILIFY) 5 MG tablet Take 0.5 tablets (2.5 mg total) by mouth daily. (Patient not taking: Reported on 08/19/2021) 15 tablet 1  ? FLUoxetine (PROZAC) 20 MG capsule Take 1 capsule (20 mg total) by mouth daily with breakfast. 90 capsule 1  ?  gabapentin (NEURONTIN) 300 MG capsule Take 1 capsule (300 mg total) by mouth as directed. Take 1 capsule daily at 7 AM , 1 cap at 1 PM , 2 Caps at bedtime (Patient not taking: Reported on 08/08/2021) 120 capsule 1  ? terconazole (TERAZOL 7) 0.4 % vaginal cream Place 1 applicator vaginally at bedtime. (Patient not taking: Reported on 08/19/2021) 45 g 0  ? ?No current facility-administered medications for this visit.  ? ? ? ?Musculoskeletal: ?Strength & Muscle Tone: within normal limits ?Gait & Station: normal ?Patient leans: N/A ? ?Psychiatric Specialty Exam: ?Review of Systems  ?Gastrointestinal:  Positive for nausea (1 st trimester of her pregnancy.).  ?Psychiatric/Behavioral:  Positive for decreased concentration and dysphoric mood. The patient is nervous/anxious.   ?All other systems reviewed and are negative.  ?Blood pressure 92/60, pulse 70, temperature 98.8 ?F (37.1 ?C), temperature source Temporal, weight  137 lb 9.6 oz (62.4 kg), last menstrual period 06/22/2021.Body mass index is 24.37 kg/m?.  ?General Appearance: Casual  ?Eye Contact:  Fair  ?Speech:  Clear and Coherent  ?Volume:  Normal  ?Mood:  Anxious, Depressed, Irritable, and mood swings  ?Affect:  Tearful  ?Thought Process:  Goal Directed and Descriptions of Associations: Intact  ?Orientation:  Full (Time, Place, and Person)  ?Thought Content: Logical   ?Suicidal Thoughts:  No  ?Homicidal Thoughts:  No  ?Memory:  Immediate;   Fair ?Recent;   Fair ?Remote;   Fair  ?Judgement:  Fair  ?Insight:  Fair  ?Psychomotor Activity:  Normal  ?Concentration:  Concentration: Fair and Attention Span: Fair  ?Recall:  Fair  ?Fund of Knowledge: Fair  ?Language: Fair  ?Akathisia:  No  ?Handed:  Right  ?AIMS (if indicated): done,0  ?Assets:  Communication Skills ?Desire for Improvement ?Housing ?Intimacy ?Social Support  ?ADL's:  Intact  ?Cognition: WNL  ?Sleep:  Fair  ? ?Screenings: ?AIMS   ? ?Flowsheet Row Office Visit from 06/13/2021 in [facility]  ?AIMS Total Score 0  ? ?  ? ?GAD-7   ? ?Flowsheet Row Counselor from 05/22/2021 in [facility] Video Visit from 05/16/2021 in [facility] Video

## 2021-08-19 NOTE — Patient Instructions (Signed)
Pregnancy Considerations  ?Aripiprazole crosses the placenta; aripiprazole and dehydro-aripiprazole can be detected in the cord blood at delivery. ?Antipsychotic use during the third trimester of pregnancy has a risk for abnormal muscle movements (extrapyramidal symptoms [EPS]) and/or withdrawal symptoms in newborns following delivery. Symptoms in the newborn may include agitation, feeding disorder, hypertonia, hypotonia, respiratory distress, somnolence, and tremor; these effects may be self-limiting or require hospitalization. ?Treatment algorithms have been developed by the ACOG and the APA for the management of depression in women prior to conception and during pregnancy . The ACOG recommends that therapy during pregnancy be individualized; treatment with psychiatric medications during pregnancy should incorporate the clinical expertise of the mental health clinician, obstetrician, primary health care provider, and pediatrician. Safety data related to atypical antipsychotics during pregnancy is limited, as such, routine use is not recommended. However, if a woman is inadvertently exposed to an atypical antipsychotic while pregnant, continuing therapy may be preferable to switching to an agent that the fetus has not yet been exposed to; consider risk:benefit (ACOG 2008). If treatment is initiated during pregnancy, use of an agent other than aripiprazole is preferred. ?Health care providers are encouraged to enroll women exposed to aripiprazole during pregnancy in the [facility] Pregnancy Registry for Atypical Antipsychotics ([phone].http://www.womensmentalhealth.org/clinical-and-research-programs/pregnancyregistry/). ? ?Pregnancy Considerations  ?Fluoxetine and its metabolite cross the placenta. Fluoxetine is cleared slowly by the newborn Tivis Ringer 2015). ?As a class, selective serotonin reuptake inhibitors (SSRIs) have been evaluated extensively in pregnant patients. Studies focusing on newborn outcomes  following first trimester exposure often have inconsistent results due to differences in study design and confounders such as maternal disease and social factors. An increased risk of adverse cardiovascular events following in utero exposure to fluoxetine has been observed in some studies; however, a causal relationship has not been established. ?Adverse effects in the newborn following SSRI exposure late in the third trimester can include apnea, constant crying, cyanosis, feeding difficulty, hyperreflexia, hypo- or hypertonia, hypoglycemia, irritability, jitteriness, respiratory distress, seizures, temperature instability, tremor, and vomiting. Prolonged hospitalization, respiratory support, or tube feedings may be required. Symptoms may be due to the toxicity of the SSRIs or a discontinuation syndrome and may be consistent with serotonin syndrome associated with SSRI treatment. Persistent pulmonary hypertension of the newborn has been reported with SSRI exposure; although the absolute risk is small, monitoring of infants exposed to SSRIs late in pregnancy is recommended  T ?Due to pregnancy-induced physiologic changes, some pharmacokinetic parameters of fluoxetine may be altered [facility] 2003; Hostetter 2000; Kim 2006; Sit 2010). However, dose adjustments may only be needed if symptoms recur or worsen during pregnancy. Close clinical monitoring as pregnancy progresses and therapeutic drug monitoring to detect patterns of changing plasma concentrations is recommended to assist dose adjustment when needed. If dosing is increased during pregnancy, a gradual taper to the prepregnancy range should be done postpartum Chief Executive Officer 2020; Schoretsanitis 2020; Sit 2010). ?If treatment for major depressive disorder is initiated for the first time during pregnancy, fluoxetine is not one of the preferred SSRIs (CANMAT [MacQueen 2016]; Larsen 2015; WFSBP [Bauer 2013]). If pregnancy occurs during fluoxetine therapy, a change in  treatment is only recommended if it can be safely done in relation to maternal disease Tivis Ringer 2015). Untreated or inadequately treated psychiatric illness may lead to poor adherence with prenatal care and adverse pregnancy outcomes. Therapy with antidepressants during pregnancy should be individualized; treatment with antidepressant medication is recommended for pregnant patients with severe major depressive disorder (ACOG 2008; CANMAT [MacQueen 2016]). Patients treated for major depression and who  are euthymic prior to pregnancy are more likely to experience a relapse when medication is discontinued (68%) as compared to pregnant patients who continue taking antidepressant medications (26%) (Cohen 2006). ?Data collection to monitor pregnancy and infant outcomes following exposure to antidepressant medications is ongoing. Patients exposed to antidepressants during pregnancy are encouraged to enroll in the [facility] Pregnancy Registry for Antidepressants. Pregnant patients 6 to 27 years of age or their health care providers may contact the registry to enroll; enrollment should be done as early in pregnancy as possible [phone] or MobileShades.de). ?

## 2021-08-20 ENCOUNTER — Encounter: Payer: 59 | Admitting: Advanced Practice Midwife

## 2021-08-20 ENCOUNTER — Telehealth: Payer: Self-pay

## 2021-08-20 NOTE — Telephone Encounter (Signed)
Tried to call patient to let her know that FMLA paperwork was completed and that a $29 fee would have to be paid before form could be release.  Also Dr. Elna Breslow wants to see her in about 10 days.  ? ?There was no answer and mailbox was full ?

## 2021-08-21 ENCOUNTER — Ambulatory Visit (INDEPENDENT_AMBULATORY_CARE_PROVIDER_SITE_OTHER): Payer: 59 | Admitting: Licensed Practical Nurse

## 2021-08-21 ENCOUNTER — Telehealth: Payer: Self-pay

## 2021-08-21 ENCOUNTER — Ambulatory Visit: Payer: No Typology Code available for payment source

## 2021-08-21 ENCOUNTER — Other Ambulatory Visit (HOSPITAL_COMMUNITY)
Admission: RE | Admit: 2021-08-21 | Discharge: 2021-08-21 | Disposition: A | Discharge status: Home / Self Care | Payer: 59 | Source: Ambulatory Visit | Attending: Licensed Practical Nurse | Admitting: Licensed Practical Nurse

## 2021-08-21 ENCOUNTER — Encounter: Payer: Self-pay | Admitting: Licensed Practical Nurse

## 2021-08-21 ENCOUNTER — Ambulatory Visit (INDEPENDENT_AMBULATORY_CARE_PROVIDER_SITE_OTHER): Payer: 59 | Admitting: Licensed Clinical Social Worker

## 2021-08-21 VITALS — BP 118/60 | Wt 130.0 lb

## 2021-08-21 DIAGNOSIS — O99341 Other mental disorders complicating pregnancy, first trimester: Secondary | ICD-10-CM

## 2021-08-21 DIAGNOSIS — O99321 Drug use complicating pregnancy, first trimester: Secondary | ICD-10-CM

## 2021-08-21 DIAGNOSIS — O0990 Supervision of high risk pregnancy, unspecified, unspecified trimester: Secondary | ICD-10-CM

## 2021-08-21 DIAGNOSIS — O26859 Spotting complicating pregnancy, unspecified trimester: Secondary | ICD-10-CM

## 2021-08-21 DIAGNOSIS — F319 Bipolar disorder, unspecified: Secondary | ICD-10-CM

## 2021-08-21 DIAGNOSIS — Z3A08 8 weeks gestation of pregnancy: Secondary | ICD-10-CM

## 2021-08-21 DIAGNOSIS — Z0283 Encounter for blood-alcohol and blood-drug test: Secondary | ICD-10-CM | POA: Diagnosis not present

## 2021-08-21 DIAGNOSIS — F1220 Cannabis dependence, uncomplicated: Secondary | ICD-10-CM

## 2021-08-21 DIAGNOSIS — Z3480 Encounter for supervision of other normal pregnancy, unspecified trimester: Secondary | ICD-10-CM

## 2021-08-21 DIAGNOSIS — O219 Vomiting of pregnancy, unspecified: Secondary | ICD-10-CM

## 2021-08-21 DIAGNOSIS — O418X10 Other specified disorders of amniotic fluid and membranes, first trimester, not applicable or unspecified: Secondary | ICD-10-CM

## 2021-08-21 DIAGNOSIS — Z113 Encounter for screening for infections with a predominantly sexual mode of transmission: Secondary | ICD-10-CM | POA: Insufficient documentation

## 2021-08-21 DIAGNOSIS — F418 Other specified anxiety disorders: Secondary | ICD-10-CM

## 2021-08-21 LAB — POCT URINALYSIS DIPSTICK OB
Glucose, UA: NEGATIVE
POC,PROTEIN,UA: NEGATIVE

## 2021-08-21 MED ORDER — ONDANSETRON 4 MG PO TBDP: 4.0000 mg | ORAL_TABLET | Freq: Four times a day (QID) | ORAL | 3 refills | Status: DC | PRN

## 2021-08-21 NOTE — Telephone Encounter (Signed)
Left message on voicemail to return call.

## 2021-08-21 NOTE — Telephone Encounter (Signed)
-----   Message from Loran Senters, [place] sent at 08/21/2021 12:14 PM EDT ----- ?I don't know how to order u/s and pend it.  I have not called the pt.  Thanks. ?----- Message ----- ?From: Horald Pollen, MD ?Sent: 08/21/2021   9:44 AM EDT ?To: Ws-Clinical ? ?Please inform patient that viable IUP was seen with small hemorrhage noted; recommend follow up US in 14 days. Please order/pend. ? ?

## 2021-08-22 LAB — HEPATITIS C ANTIBODY: Hep C Virus Ab: NONREACTIVE

## 2021-08-22 LAB — CERVICOVAGINAL ANCILLARY ONLY
Bacterial Vaginitis (gardnerella): NEGATIVE
Candida Glabrata: NEGATIVE
Candida Vaginitis: NEGATIVE
Chlamydia: NEGATIVE
Comment: NEGATIVE
Comment: NEGATIVE
Comment: NEGATIVE
Comment: NEGATIVE
Comment: NEGATIVE
Comment: NORMAL
Neisseria Gonorrhea: NEGATIVE
Trichomonas: NEGATIVE

## 2021-08-22 LAB — CBC/D/PLT+RPR+RH+ABO+RUBIGG...
Antibody Screen: NEGATIVE
Basophils Absolute: 0.1 10*3/uL (ref 0.0–0.2)
Basos: 1 %
EOS (ABSOLUTE): 0.1 10*3/uL (ref 0.0–0.4)
Eos: 1 %
HCV Ab: NONREACTIVE
HIV Screen 4th Generation wRfx: NONREACTIVE
Hematocrit: 39.7 % (ref 34.0–46.6)
Hemoglobin: 13.2 g/dL (ref 11.1–15.9)
Hepatitis B Surface Ag: NEGATIVE
Immature Grans (Abs): 0 10*3/uL (ref 0.0–0.1)
Immature Granulocytes: 0 %
Lymphocytes Absolute: 1.8 10*3/uL (ref 0.7–3.1)
Lymphs: 19 %
MCH: 29.9 pg (ref 26.6–33.0)
MCHC: 33.2 g/dL (ref 31.5–35.7)
MCV: 90 fL (ref 79–97)
Monocytes Absolute: 0.5 10*3/uL (ref 0.1–0.9)
Monocytes: 6 %
Neutrophils Absolute: 6.8 10*3/uL (ref 1.4–7.0)
Neutrophils: 73 %
Platelets: 281 10*3/uL (ref 150–450)
RBC: 4.41 x10E6/uL (ref 3.77–5.28)
RDW: 12.3 % (ref 11.7–15.4)
RPR Ser Ql: NONREACTIVE
Rh Factor: POSITIVE
Rubella Antibodies, IGG: 3.04 index (ref >0.99)
Varicella zoster IgG: 2704 index (ref >165)
WBC: 9.2 10*3/uL (ref 3.4–10.8)

## 2021-08-22 LAB — HCV INTERPRETATION

## 2021-08-22 NOTE — Plan of Care (Signed)
  Problem: Bipolar Disorder CCP Problem  1 Alleviate depressive/manic symptoms and return to improved levels of effective functioning per pt self report 3 out of 5 sessions documented.   Goal: LTG: Stabilize mood and increase goal-directed behavior: Input needed on appropriate metric Outcome: Progressing Goal: STG: Allison Black WILL IDENTIFY COGNITIVE PATTERNS AND BELIEFS THAT INTERFERE WITH THERAPY Outcome: Progressing Goal: STG: Allison Black WILL ATTEND AT LEAST 80% OF SCHEDULED FOLLOW-UP PSYCHOTHERAPY APPOINTMENTS Outcome: Progressing

## 2021-08-22 NOTE — Telephone Encounter (Signed)
Spoke w/patient. Relayed ultrasound results and need for follow up ultrasound. Patient aware someone will reach out to her about scheduling. Previous future order for ultrasound available/released.

## 2021-08-22 NOTE — Telephone Encounter (Signed)
Voicemail full. Unable to leave message

## 2021-08-22 NOTE — Telephone Encounter (Signed)
Patient returning call. [id number]

## 2021-08-22 NOTE — Progress Notes (Addendum)
  THERAPIST PROGRESS NOTE  Session Time: 3-345p  ARPA in office visit for patient and LCSW clinician  Participation Level: Active  Behavioral Response: NeatAlertAnxious  Type of Therapy: Individual Therapy  Treatment Goals addressed: Problem: Bipolar Disorder CCP Problem  1 Alleviate depressive/manic symptoms and return to improved levels of effective functioning per pt self report 3 out of 5 sessions documented.    Goal: LTG: Stabilize mood and increase goal-directed behavior: Input needed Black appropriate metric Outcome: Progressing Note: Pt reporting mood more stable--less incidents with angry/impulsive outbursts  Goal: STG: Allison Black IDENTIFY COGNITIVE PATTERNS AND BELIEFS THAT INTERFERE WITH THERAPY Outcome: Progressing  Goal: STG: Allison Black ATTEND AT LEAST 80% OF SCHEDULED FOLLOW-UP PSYCHOTHERAPY APPOINTMENTS Outcome: Progressing  ProgressTowards Goals: Progressing  Interventions: CBT Intervention: Encourage verbalization of feelings/concerns/expectations Intervention: Encourage patient to identify triggers Intervention: Manage signs of labile or escalating emotions Intervention: EDUCATE Allison Black COPE AHEAD SKILLS TO COPE WITH ANGER AROUSAL   Summary: Allison Black is a 26 y.o. female who presents with improving symptoms related to bipolar disorder diagnosis.   Allowed pt to explore and express thoughts and feelings associated with recent life situations and external stressors. Patient reports a recent increase in stress and anxiety symptoms due to finding out recently that she is pregnant. Patient reports that she really doesn't know how she feels about this at this point in time, she is still in shock. Patient reports that she is feeling a lot of physical stress associated with the pregnancy due to nausea and vomiting.  Allowed patient to explore relationship with the baby's father. Allowed patient to discuss relationships with relatives and friends. Patient reports  she is currently out of work, but things are going well at work.  Patient reports that she is feeling more irritability and anger recently. Reviewed ways of relaxation, and encouraged patient to use meditation and mindfulness. Discussed healthy ways of dealing with anger.  Continued recommendations are as follows: self care behaviors, positive social engagements, focusing Black overall work/home/life balance, and focusing Black positive physical and emotional wellness.  .   Suicidal/Homicidal: No  Therapist Response: Pt is continuing to apply interventions learned in session into daily life situations. Pt is currently Black track to meet goals utilizing interventions mentioned above. Personal growth and progress noted. Treatment to continue as indicated.   Provided pt psychoeducational packet with anger management materials.    Plan: Return again in 4 weeks.  Diagnosis: Bipolar and related disorder [facility])  Collaboration of Care: Other pt to continue with psychiatrist of record, Dr. Jomarie Longs  Patient/Guardian was advised Release of Information must be obtained prior to any record release in order to collaborate their care with an outside provider. Patient/Guardian was advised if they have not already done so to contact the registration department to sign all necessary forms in order for [place] to release information regarding their care.   Consent: Patient/Guardian gives verbal consent for treatment and assignment of benefits for services provided during this visit. Patient/Guardian expressed understanding and agreed to proceed.   Ernest Haber Leonora Gores, LCSW 08/22/2021

## 2021-08-23 ENCOUNTER — Ambulatory Visit: Payer: 59 | Admitting: Psychiatry

## 2021-08-23 LAB — URINE CULTURE

## 2021-08-23 NOTE — Progress Notes (Signed)
Routine Prenatal Care Visit  Subjective  Allison Black is a 26 y.o. [phone] at [redacted]w[redacted]d being seen today for ongoing prenatal care.  She is currently monitored for the following issues for this high-risk pregnancy and has Depression with anxiety; Episodic lightheadedness; Need for immunization against influenza; Asthma; Bipolar and related disorder ([place]); GAD (generalized anxiety disorder); Cannabis use disorder, moderate, dependence ([place]); Long term current use of cannabis; At risk for prolonged QT interval syndrome; Noncompliance with treatment plan; Acute cystitis with hematuria; High risk medication use; Missed period; Supervision of high risk pregnancy, antepartum; Candidal vaginitis; and Bipolar disease during pregnancy, antepartum ([place]) on their problem list.  ----------------------------------------------------------------------------------- Patient reports fatigue, nausea, and vomiting.  Here with friend.  Doing ok, still adjusting to pregnancy. This is a new relationship, the pregnancy was unexpected and she has had a miscarriage in the past  so tries not to get excited early on.  This is his first child-he is excited. She does not live with him, but they live near [place], she feels safe with him. She works in Occupational hygienist at [employer].  -denies smoking or alcohol, has used MJ prior to pregnancy, is aware of drug screenings.  -Has noticed increased white discharge, is prone to yeast infections, denies odor or irritation, desires swab today. -Had vaginal bleeding in this pregnancy, US shows subchorionic hemorrhage, reviewed [place] finding with pt  -Mood: feels like she is struggling, has Bipolar and was started on meds, encouraged her to fu with her psychiatric provider as I cannot prescribe anything for her.  -nausea: Phenergan not helping, will try Zofran  Contractions: Not present. Vag. Bleeding: None.  Movement: Absent. Leaking Fluid denies.    ----------------------------------------------------------------------------------- The following portions of the patient's history were reviewed and updated as appropriate: allergies, current medications, past family history, past medical history, past social history, past surgical history and problem list. Problem list updated.  Objective  Blood pressure 118/60, weight 130 lb (59 kg), last menstrual period 06/22/2021. Pregravid weight 119 lb (54 kg) Total Weight Gain 11 lb (4.99 kg) Urinalysis: Urine Protein Negative  Urine Glucose Negative  Fetal Status:     Movement: Absent    Breasts: soft, no masses Thyroid not enlarged no nodules  General:  Alert, oriented and cooperative. Patient is in no acute distress.  Skin: Skin is warm and dry. No rash noted.   Cardiovascular: Normal heart rate noted  Respiratory: Normal respiratory effort, no problems with respiration noted  Abdomen: Soft, gravid, appropriate for gestational age. Pain/Pressure: Present     Pelvic:   Cervix pink, no lesions, homogenous white discharge present          Extremities: Normal range of motion.     Mental Status: Normal mood and affect. Normal behavior. Normal judgment and thought content.   Assessment   26 y.o. [id number] at [redacted]w[redacted]d by  03/29/2022, by Last Menstrual Period presenting for routine prenatal visit  Plan   fourth Problems (from 07/31/21 to present)     Problem Noted Resolved   Supervision of high risk pregnancy, antepartum 07/31/2021 by Cleophas Dunker, CMA No       general obstetric precautions including but not limited to vaginal bleeding, contractions, leaking of fluid and fetal movement were reviewed in detail with the patient. Please refer to After Visit Summary for other counseling recommendations.   Return for 2-4 wks, for ROB and genetic screening .  Fu with psychiatrist regarding mood  Roberto Scales, CNM  Mosetta Pigeon, [place]  08/23/21  9:28 AM

## 2021-08-26 ENCOUNTER — Encounter: Payer: Self-pay | Admitting: Obstetrics and Gynecology

## 2021-08-26 NOTE — Telephone Encounter (Signed)
Patient has ultrasound scheduled for 5/30 and follow  up with MMF for 09/11/21

## 2021-08-26 NOTE — Telephone Encounter (Signed)
Tc on 08-26-21 @ 2:38 spoke with patient she states she will come by office to pay the $29 fee.

## 2021-08-27 DIAGNOSIS — F3132 Bipolar disorder, current episode depressed, moderate: Secondary | ICD-10-CM

## 2021-08-27 DIAGNOSIS — F319 Bipolar disorder, unspecified: Secondary | ICD-10-CM

## 2021-08-27 DIAGNOSIS — F411 Generalized anxiety disorder: Secondary | ICD-10-CM

## 2021-08-27 LAB — URINE DRUG PANEL 7
Amphetamines, Urine: NEGATIVE ng/mL
Barbiturate Quant, Ur: NEGATIVE ng/mL
Benzodiazepine Quant, Ur: NEGATIVE ng/mL
Cannabinoid Quant, Ur: POSITIVE — AB
Cocaine (Metab.): NEGATIVE ng/mL
Opiate Quant, Ur: NEGATIVE ng/mL
PCP Quant, Ur: NEGATIVE ng/mL

## 2021-08-30 ENCOUNTER — Emergency Department: Payer: 59

## 2021-08-30 ENCOUNTER — Emergency Department
Admission: EM | Admit: 2021-08-30 | Discharge: 2021-08-30 | Disposition: A | Discharge status: Home / Self Care | Payer: 59 | Attending: Emergency Medicine | Admitting: Emergency Medicine

## 2021-08-30 ENCOUNTER — Other Ambulatory Visit: Payer: Self-pay

## 2021-08-30 DIAGNOSIS — Z3A10 10 weeks gestation of pregnancy: Secondary | ICD-10-CM | POA: Diagnosis not present

## 2021-08-30 DIAGNOSIS — O211 Hyperemesis gravidarum with metabolic disturbance: Secondary | ICD-10-CM | POA: Diagnosis not present

## 2021-08-30 DIAGNOSIS — R109 Unspecified abdominal pain: Secondary | ICD-10-CM | POA: Diagnosis not present

## 2021-08-30 DIAGNOSIS — O99511 Diseases of the respiratory system complicating pregnancy, first trimester: Secondary | ICD-10-CM | POA: Diagnosis not present

## 2021-08-30 DIAGNOSIS — O99281 Endocrine, nutritional and metabolic diseases complicating pregnancy, first trimester: Secondary | ICD-10-CM | POA: Diagnosis not present

## 2021-08-30 DIAGNOSIS — D72829 Elevated white blood cell count, unspecified: Secondary | ICD-10-CM | POA: Diagnosis not present

## 2021-08-30 DIAGNOSIS — O219 Vomiting of pregnancy, unspecified: Secondary | ICD-10-CM | POA: Diagnosis not present

## 2021-08-30 DIAGNOSIS — R1084 Generalized abdominal pain: Secondary | ICD-10-CM | POA: Insufficient documentation

## 2021-08-30 DIAGNOSIS — E871 Hypo-osmolality and hyponatremia: Secondary | ICD-10-CM

## 2021-08-30 DIAGNOSIS — O208 Other hemorrhage in early pregnancy: Secondary | ICD-10-CM | POA: Diagnosis not present

## 2021-08-30 DIAGNOSIS — O99111 Other diseases of the blood and blood-forming organs and certain disorders involving the immune mechanism complicating pregnancy, first trimester: Secondary | ICD-10-CM | POA: Insufficient documentation

## 2021-08-30 DIAGNOSIS — O26891 Other specified pregnancy related conditions, first trimester: Secondary | ICD-10-CM | POA: Insufficient documentation

## 2021-08-30 DIAGNOSIS — E876 Hypokalemia: Secondary | ICD-10-CM | POA: Diagnosis not present

## 2021-08-30 DIAGNOSIS — R112 Nausea with vomiting, unspecified: Secondary | ICD-10-CM

## 2021-08-30 DIAGNOSIS — R1031 Right lower quadrant pain: Secondary | ICD-10-CM | POA: Diagnosis not present

## 2021-08-30 DIAGNOSIS — J45909 Unspecified asthma, uncomplicated: Secondary | ICD-10-CM | POA: Insufficient documentation

## 2021-08-30 DIAGNOSIS — K5900 Constipation, unspecified: Secondary | ICD-10-CM

## 2021-08-30 DIAGNOSIS — R188 Other ascites: Secondary | ICD-10-CM | POA: Diagnosis not present

## 2021-08-30 LAB — CBC WITH DIFFERENTIAL/PLATELET
Abs Immature Granulocytes: 0.09 10*3/uL — ABNORMAL HIGH (ref 0.00–0.07)
Basophils Absolute: 0.1 10*3/uL (ref 0.0–0.1)
Basophils Relative: 0 %
Eosinophils Absolute: 0 10*3/uL (ref 0.0–0.5)
Eosinophils Relative: 0 %
HCT: 40.5 % (ref 36.0–46.0)
Hemoglobin: 14 g/dL (ref 12.0–15.0)
Immature Granulocytes: 1 %
Lymphocytes Relative: 13 %
Lymphs Abs: 2.4 10*3/uL (ref 0.7–4.0)
MCH: 29.9 pg (ref 26.0–34.0)
MCHC: 34.6 g/dL (ref 30.0–36.0)
MCV: 86.5 fL (ref 80.0–100.0)
Monocytes Absolute: 0.2 10*3/uL (ref 0.1–1.0)
Monocytes Relative: 1 %
Neutro Abs: 16.1 10*3/uL — ABNORMAL HIGH (ref 1.7–7.7)
Neutrophils Relative %: 85 %
Platelets: 300 10*3/uL (ref 150–400)
RBC: 4.68 MIL/uL (ref 3.87–5.11)
RDW: 12.8 % (ref 11.5–15.5)
WBC: 18.9 10*3/uL — ABNORMAL HIGH (ref 4.0–10.5)
nRBC: 0 % (ref 0.0–0.2)

## 2021-08-30 LAB — COMPREHENSIVE METABOLIC PANEL
ALT: 16 U/L (ref 0–44)
AST: 33 U/L (ref 15–41)
Albumin: 4.1 g/dL (ref 3.5–5.0)
Alkaline Phosphatase: 57 U/L (ref 38–126)
Anion gap: 13 (ref 5–15)
BUN: 14 mg/dL (ref 6–20)
CO2: 17 mmol/L — ABNORMAL LOW (ref 22–32)
Calcium: 10.2 mg/dL (ref 8.9–10.3)
Chloride: 102 mmol/L (ref 98–111)
Creatinine, Ser: 0.73 mg/dL (ref 0.44–1.00)
GFR, Estimated: >60 mL/min (ref >60)
Glucose, Bld: 156 mg/dL — ABNORMAL HIGH (ref 70–99)
Potassium: 3.2 mmol/L — ABNORMAL LOW (ref 3.5–5.1)
Sodium: 132 mmol/L — ABNORMAL LOW (ref 135–145)
Total Bilirubin: 0.7 mg/dL (ref 0.3–1.2)
Total Protein: 7.6 g/dL (ref 6.5–8.1)

## 2021-08-30 LAB — URINALYSIS, ROUTINE W REFLEX MICROSCOPIC
Bilirubin Urine: NEGATIVE
Glucose, UA: NEGATIVE mg/dL
Hgb urine dipstick: NEGATIVE
Ketones, ur: 20 mg/dL — AB
Leukocytes,Ua: NEGATIVE
Nitrite: NEGATIVE
Protein, ur: NEGATIVE mg/dL
Specific Gravity, Urine: 1.015 (ref 1.005–1.030)
pH: 5 (ref 5.0–8.0)

## 2021-08-30 LAB — LIPASE, BLOOD: Lipase: 43 U/L (ref 11–51)

## 2021-08-30 MED ORDER — POTASSIUM CHLORIDE 10 MEQ/100ML IV SOLN
10.0000 meq | Freq: Once | INTRAVENOUS | Status: AC
  Administered 2021-08-30: 10 meq via INTRAVENOUS
  Filled 2021-08-30: qty 100

## 2021-08-30 MED ORDER — HYDROMORPHONE HCL 1 MG/ML IJ SOLN
0.5000 mg | Freq: Once | INTRAMUSCULAR | Status: AC
  Administered 2021-08-30: 0.5 mg via INTRAVENOUS
  Filled 2021-08-30: qty 0.5

## 2021-08-30 MED ORDER — ONDANSETRON HCL 4 MG/2ML IJ SOLN
4.0000 mg | Freq: Once | INTRAMUSCULAR | Status: AC
  Administered 2021-08-30: 4 mg via INTRAVENOUS
  Filled 2021-08-30: qty 2

## 2021-08-30 MED ORDER — SODIUM CHLORIDE 0.9 % IV BOLUS
1000.0000 mL | Freq: Once | INTRAVENOUS | Status: AC
  Administered 2021-08-30: 1000 mL via INTRAVENOUS

## 2021-08-30 MED ORDER — METOCLOPRAMIDE HCL 5 MG/ML IJ SOLN
10.0000 mg | Freq: Once | INTRAMUSCULAR | Status: AC
  Administered 2021-08-30: 10 mg via INTRAVENOUS
  Filled 2021-08-30: qty 2

## 2021-08-30 MED ORDER — MORPHINE SULFATE (PF) 4 MG/ML IV SOLN
4.0000 mg | Freq: Once | INTRAVENOUS | Status: AC
  Administered 2021-08-30: 4 mg via INTRAVENOUS
  Filled 2021-08-30: qty 1

## 2021-08-30 MED ORDER — POLYETHYLENE GLYCOL 3350 17 G PO PACK: 17.0000 g | PACK | Freq: Every day | ORAL | 0 refills | Status: AC

## 2021-08-30 MED ORDER — FLEET ENEMA 7-19 GM/118ML RE ENEM
1.0000 | ENEMA | Freq: Once | RECTAL | Status: AC
  Administered 2021-08-30: 1 via RECTAL

## 2021-08-30 MED ORDER — METOCLOPRAMIDE HCL 5 MG/ML IJ SOLN
5.0000 mg | Freq: Once | INTRAMUSCULAR | Status: AC
  Administered 2021-08-30: 5 mg via INTRAVENOUS
  Filled 2021-08-30: qty 2

## 2021-08-30 NOTE — ED Provider Notes (Addendum)
Patient signed out to me pending ultrasound and reassessment.  In brief she is a 26 year old G4, P2 presenting with lower abdominal pain and constipation.  She has received 2 doses of IV opiates.  Ultrasound of the abdomen is normal ultrasound of the pelvis shows subchorionic hemorrhage but otherwise is reassuring.  My reassessment patient continues to have abdominal pain.  She has tenderness in bilateral lower quadrants.  My suspicion is that this is constipation related as she has not had a BM in over 1 week which is not normal for her pain started after taking a stool softener however with her leukocytosis and ongoing pain do feel that she would benefit from MRI to rule out appendicitis or other acute surgical process.  Will order MRI.  10:40 AM MRI shows severe constipation.  Discussed with patient we will try an enema here.   Georga Hacking, MD 08/30/21 1791    Georga Hacking, MD 08/30/21 1040

## 2021-08-30 NOTE — ED Triage Notes (Signed)
Pt presents to ER with boyfriend.  Pt states tonight she woke up with pain that felt like "contractions."  Pt states she is [redacted] weeks pregnant.  Pt states she has been nauseous, but also constipated.  Pt states she took some stool softener before she went to bed tonight and was woken from her sleep soon afterwards.  Pt denies any vaginal bleeding.  Pt seen at [facility].  Pt is a G4P2.  Pt alert and moaning in pain at this time.

## 2021-08-30 NOTE — ED Provider Notes (Signed)
[facility] Provider Note    Event Date/Time   First MD Initiated Contact with Patient 08/30/21 [phone]     (approximate)   History   Abdominal Pain   HPI  Allison Black is a 26 y.o. female who presents to the ED from home with a chief complaint of abdominal pain.  Patient is G4, P2 approximately [redacted] weeks pregnant followed by [facility].  Took a stool softener before she went to bed tonight for constipation and awoke with diffuse abdominal pain associated with nausea.  Denies fever, cough, chest pain, shortness of breath, vaginal bleeding, dysuria or diarrhea.     Past Medical History   Past Medical History:  Diagnosis Date   Asthma    Bipolar disorder ([place])    Family history of ovarian cancer    5/23 cancer genetic testing letter sent   Heart murmur    Panic attack      Active Problem List   Patient Active Problem List   Diagnosis Date Noted   Bipolar disease during pregnancy, antepartum ([place]) 08/15/2021   Candidal vaginitis 08/08/2021   Supervision of high risk pregnancy, antepartum 07/31/2021   Missed period 07/29/2021   High risk medication use 06/13/2021   Acute cystitis with hematuria 05/06/2021   Long term current use of cannabis 04/23/2021   At risk for prolonged QT interval syndrome 04/23/2021   Noncompliance with treatment plan 04/23/2021   Bipolar and related disorder ([place]) 03/27/2021   GAD (generalized anxiety disorder) 03/27/2021   Cannabis use disorder, moderate, dependence ([place]) 03/27/2021   Asthma 02/10/2017   Need for immunization against influenza 01/27/2017   Episodic lightheadedness 12/19/2016   Depression with anxiety 10/09/2016     Past Surgical History   Past Surgical History:  Procedure Laterality Date   NO PAST SURGERIES       Home Medications   Prior to Admission medications   Medication Sig Start Date End Date Taking? Authorizing Provider  albuterol (VENTOLIN HFA) 108 (90 Base) MCG/ACT inhaler  Inhale into the lungs. Patient not taking: Reported on 08/21/2021 01/01/21   [provider]  ARIPiprazole (ABILIFY) 5 MG tablet Take 0.5 tablets (2.5 mg total) by mouth daily. Patient not taking: Reported on 08/19/2021 08/19/21   Ursula Alert, MD  FLUoxetine (PROZAC) 20 MG capsule Take 1 capsule (20 mg total) by mouth daily with breakfast. 08/19/21   Ursula Alert, MD  gabapentin (NEURONTIN) 300 MG capsule Take 1 capsule (300 mg total) by mouth as directed. Take 1 capsule daily at 7 AM , 1 cap at 1 PM , 2 Caps at bedtime Patient not taking: Reported on 08/08/2021 06/13/21   Ursula Alert, MD  hydrOXYzine (ATARAX) 10 MG tablet Take 1 tablet (10 mg total) by mouth 3 (three) times daily as needed for anxiety. Patient not taking: Reported on 08/21/2021 06/13/21   Ursula Alert, MD  ondansetron (ZOFRAN-ODT) 4 MG disintegrating tablet Take 1 tablet (4 mg total) by mouth every 6 (six) hours as needed for nausea. 08/21/21   Dominic, Nunzio Cobbs, CNM  promethazine (PHENERGAN) 25 MG tablet Take 1 tablet (25 mg total) by mouth every 6 (six) hours as needed for nausea or vomiting. 0000000   Copland, Deirdre Evener, PA-C  terconazole (TERAZOL 7) 0.4 % vaginal cream Place 1 applicator vaginally at bedtime. Patient not taking: Reported on 08/19/2021 0000000   Copland, Deirdre Evener, PA-C     Allergies  Patient has no known allergies.   Family History   Family  History  Problem Relation Age of Onset   Bipolar disorder Mother    Lupus Mother    Mental illness Sister    Diabetes Maternal Grandmother    Bipolar disorder Maternal Aunt    Ovarian cancer Maternal Aunt    Bipolar disorder Maternal Aunt    Bipolar disorder Maternal Aunt    Schizophrenia Maternal Aunt    Suicidality Maternal Aunt    Diabetes Cousin      Physical Exam  Triage Vital Signs: ED Triage Vitals  Enc Vitals Group     BP 08/30/21 0440 (!) 96/52     Pulse Rate 08/30/21 0440 81     Resp 08/30/21 0440 (!) 24     Temp 08/30/21 0440  (!) 97.3 F (36.3 C)     Temp Source 08/30/21 0440 Oral     SpO2 08/30/21 0440 100 %     Weight 08/30/21 0440 133 lb (60.3 kg)     Height 08/30/21 0440 5\' 2"  (1.575 m)     Head Circumference --      Peak Flow --      Pain Score 08/30/21 0439 10     Pain Loc --      Pain Edu? --      Excl. in [place]? --     Updated Vital Signs: BP (!) 96/52   Pulse 81   Temp (!) 97.3 F (36.3 C) (Oral)   Resp (!) 24   Ht 5\' 2"  (1.575 m)   Wt 60.3 kg   LMP 06/22/2021 (Exact Date)   SpO2 100%   BMI 24.33 kg/m    General: Awake, moderate distress.  CV:  RRR.  Good peripheral perfusion.  Resp:  Increased effort.  CTA B. Abd:  Mild diffuse tenderness to palpation without rebound or guarding.  No distention.  Other:  No calf swelling or tenderness.   ED Results / Procedures / Treatments  Labs (all labs ordered are listed, but only abnormal results are displayed) Labs Reviewed  CBC WITH DIFFERENTIAL/PLATELET - Abnormal; Notable for the following components:      Result Value   WBC 18.9 (*)    Neutro Abs 16.1 (*)    Abs Immature Granulocytes 0.09 (*)    All other components within normal limits  COMPREHENSIVE METABOLIC PANEL - Abnormal; Notable for the following components:   Sodium 132 (*)    Potassium 3.2 (*)    CO2 17 (*)    Glucose, Bld 156 (*)    All other components within normal limits  LIPASE, BLOOD  URINALYSIS, ROUTINE W REFLEX MICROSCOPIC     EKG  None   RADIOLOGY I have independently visualized and interpreted patient's ultrasounds as well as noted the radiology interpretation:  OB ultrasound: Pending  Abdominal ultrasound: Normal  Official radiology report(s): US Abdomen Complete  Result Date: 08/30/2021 CLINICAL DATA:  Abdominal pain 2 9 EXAM: ABDOMEN ULTRASOUND COMPLETE COMPARISON:  None Available. FINDINGS: Gallbladder: No gallstones or wall thickening visualized. No sonographic Murphy sign noted by sonographer. Common bile duct: Diameter: 3 mm Liver: No focal  lesion identified. Within normal limits in parenchymal echogenicity. Portal vein is patent on color Doppler imaging with normal direction of blood flow towards the liver. IVC: No abnormality visualized. Pancreas: Visualized portion unremarkable. Spleen: Size and appearance within normal limits. Right Kidney: Length: 10.5 cm. Echogenicity within normal limits. No mass or hydronephrosis visualized. Left Kidney: Length: 10 cm. Echogenicity within normal limits. No mass or hydronephrosis visualized. Abdominal aorta: No aneurysm  visualized. Other findings: None. IMPRESSION: Normal abdominal ultrasound. Electronically Signed   By: Jorje Guild M.D.   On: 08/30/2021 06:47     PROCEDURES:  Critical Care performed: Yes, see critical care procedure note(s)  CRITICAL CARE Performed by: Paulette Blanch   Total critical care time: 30 minutes  Critical care time was exclusive of separately billable procedures and treating other patients.  Critical care was necessary to treat or prevent imminent or life-threatening deterioration.  Critical care was time spent personally by me on the following activities: development of treatment plan with patient and/or surrogate as well as nursing, discussions with consultants, evaluation of patient's response to treatment, examination of patient, obtaining history from patient or surrogate, ordering and performing treatments and interventions, ordering and review of laboratory studies, ordering and review of radiographic studies, pulse oximetry and re-evaluation of patient's condition.   Procedures   MEDICATIONS ORDERED IN ED: Medications  potassium chloride 10 mEq in 100 mL IVPB (has no administration in time range)  potassium chloride 10 mEq in 100 mL IVPB (10 mEq Intravenous New Bag/Given 08/30/21 0654)  sodium chloride 0.9 % bolus 1,000 mL (1,000 mLs Intravenous New Bag/Given 08/30/21 0514)  ondansetron (ZOFRAN) injection 4 mg (4 mg Intravenous Given 08/30/21 0505)   morphine (PF) 4 MG/ML injection 4 mg (4 mg Intravenous Given 08/30/21 0505)  HYDROmorphone (DILAUDID) injection 0.5 mg (0.5 mg Intravenous Given 08/30/21 0651)  metoCLOPramide (REGLAN) injection 5 mg (5 mg Intravenous Given 08/30/21 0651)     IMPRESSION / MDM / ASSESSMENT AND PLAN / ED COURSE  I reviewed the triage vital signs and the nursing notes.                             26 year old female approximately [redacted] weeks pregnant presenting with diffuse abdominal pain.  I have identified that this patient may have a potentially life-threatening condition. Differential diagnosis includes, but is not limited to, ovarian cyst, ovarian torsion, acute appendicitis, diverticulitis, urinary tract infection/pyelonephritis, endometriosis, bowel obstruction, colitis, renal colic, gastroenteritis, hernia, fibroids, endometriosis, pregnancy related pain including ectopic pregnancy, etc.   I have personally reviewed patient's records and note her initial prenatal visit on 08/21/2021.  We will obtain lab work, ultrasound abdomen as well as OB ultrasound.  Keep n.p.o., initiate IV fluid resuscitation, administer IV Morphine for pain paired with IV Zofran for nausea.  Patient too uncomfortable for rectal exam to assess for fecal impaction currently.  Will reassess.  Clinical Course as of 08/30/21 0659  Fri Aug 30, 2021  0557 Patient currently in ultrasound.  Laboratory results notable for leukocytosis WBC 18.9, mild hyponatremia and hypokalemia with sodium 132, potassium 3.2. [JS]  [id number] Patient back from ultrasound.  Actively vomiting.  States morphine did nothing for her pain.  Will administer low-dose IV Dilaudid and Reglan for nausea/vomiting. [JS]  [id number] Care transferred to Dr. Starleen Blue at change of shift pending [place] results and UA. Consider MRI if unremarkable given patient's degree of distress. [JS]    Clinical Course User Index [JS] Paulette Blanch, MD     FINAL CLINICAL IMPRESSION(S) / ED DIAGNOSES   Final  diagnoses:  Pregnancy with generalized abdominal pain, antepartum  Hyponatremia  Hypokalemia  Nausea and vomiting, unspecified vomiting type     Rx / DC Orders   ED Discharge Orders     None        Note:  This document was prepared using Dragon voice recognition software  and may include unintentional dictation errors.   Paulette Blanch, MD 08/30/21 [phone]

## 2021-08-30 NOTE — Discharge Instructions (Signed)
Your MRI today showed that you are very constipated which is likely the cause of your pain.  Please start taking the MiraLAX twice a day until you start to be regular and you can then decrease to once a day.

## 2021-08-31 ENCOUNTER — Emergency Department
Admission: EM | Admit: 2021-08-31 | Discharge: 2021-08-31 | Disposition: A | Discharge status: Home / Self Care | Payer: 59 | Attending: Emergency Medicine | Admitting: Emergency Medicine

## 2021-08-31 ENCOUNTER — Encounter: Payer: Self-pay | Admitting: Emergency Medicine

## 2021-08-31 ENCOUNTER — Other Ambulatory Visit: Payer: Self-pay

## 2021-08-31 DIAGNOSIS — K625 Hemorrhage of anus and rectum: Secondary | ICD-10-CM | POA: Insufficient documentation

## 2021-08-31 DIAGNOSIS — O26891 Other specified pregnancy related conditions, first trimester: Secondary | ICD-10-CM | POA: Insufficient documentation

## 2021-08-31 DIAGNOSIS — Z3A10 10 weeks gestation of pregnancy: Secondary | ICD-10-CM | POA: Insufficient documentation

## 2021-08-31 DIAGNOSIS — K5900 Constipation, unspecified: Secondary | ICD-10-CM | POA: Insufficient documentation

## 2021-08-31 DIAGNOSIS — J45909 Unspecified asthma, uncomplicated: Secondary | ICD-10-CM | POA: Diagnosis not present

## 2021-08-31 DIAGNOSIS — O99611 Diseases of the digestive system complicating pregnancy, first trimester: Secondary | ICD-10-CM | POA: Diagnosis not present

## 2021-08-31 LAB — COMPREHENSIVE METABOLIC PANEL
ALT: 16 U/L (ref 0–44)
AST: 31 U/L (ref 15–41)
Albumin: 3.5 g/dL (ref 3.5–5.0)
Alkaline Phosphatase: 45 U/L (ref 38–126)
Anion gap: 8 (ref 5–15)
BUN: 9 mg/dL (ref 6–20)
CO2: 22 mmol/L (ref 22–32)
Calcium: 8.6 mg/dL — ABNORMAL LOW (ref 8.9–10.3)
Chloride: 102 mmol/L (ref 98–111)
Creatinine, Ser: 0.59 mg/dL (ref 0.44–1.00)
GFR, Estimated: >60 mL/min (ref >60)
Glucose, Bld: 128 mg/dL — ABNORMAL HIGH (ref 70–99)
Potassium: 3.4 mmol/L — ABNORMAL LOW (ref 3.5–5.1)
Sodium: 132 mmol/L — ABNORMAL LOW (ref 135–145)
Total Bilirubin: 0.6 mg/dL (ref 0.3–1.2)
Total Protein: 6.7 g/dL (ref 6.5–8.1)

## 2021-08-31 LAB — CBC
HCT: 39 % (ref 36.0–46.0)
Hemoglobin: 13 g/dL (ref 12.0–15.0)
MCH: 29.4 pg (ref 26.0–34.0)
MCHC: 33.3 g/dL (ref 30.0–36.0)
MCV: 88.2 fL (ref 80.0–100.0)
Platelets: 246 10*3/uL (ref 150–400)
RBC: 4.42 MIL/uL (ref 3.87–5.11)
RDW: 13.3 % (ref 11.5–15.5)
WBC: 17.5 10*3/uL — ABNORMAL HIGH (ref 4.0–10.5)
nRBC: 0 % (ref 0.0–0.2)

## 2021-08-31 LAB — URINALYSIS, ROUTINE W REFLEX MICROSCOPIC
Bacteria, UA: NONE SEEN
Bilirubin Urine: NEGATIVE
Glucose, UA: NEGATIVE mg/dL
Hgb urine dipstick: NEGATIVE
Ketones, ur: NEGATIVE mg/dL
Leukocytes,Ua: NEGATIVE
Nitrite: NEGATIVE
Protein, ur: 30 mg/dL — AB
Specific Gravity, Urine: 1.031 — ABNORMAL HIGH (ref 1.005–1.030)
pH: 5 (ref 5.0–8.0)

## 2021-08-31 LAB — LIPASE, BLOOD: Lipase: 34 U/L (ref 11–51)

## 2021-08-31 LAB — POC URINE PREG, ED: Preg Test, Ur: POSITIVE — AB

## 2021-08-31 NOTE — Discharge Instructions (Signed)
Please continue to take the MiraLAX.  You can use a suppository if needed.  I would stick to a clear liquid diet until you are feeling better.  You can use the antinausea medicine that you have been given.  You can also take Tylenol as needed for pain.

## 2021-08-31 NOTE — ED Provider Notes (Signed)
[facility] Provider Note    Event Date/Time   First MD Initiated Contact with Patient 08/31/21 [phone]     (approximate)   History   Abdominal Pain and Rectal Bleeding   HPI  Allison Black is a 26 y.o. female past medical history bipolar disorder, asthma who presents with rectal bleeding and abdominal pain.  Patient seen here yesterday with abdominal pain.  Approximate [redacted] weeks pregnant.  Ultrasound showed subchorionic hemorrhage but otherwise normal IUP and MRI was obtained given ongoing abdominal pain which showed severe constipation.  Patient had not had a BM in 10 days.  She was given enema in the ED and had good results.  When she went home she started having bright red blood per rectum had about 4-5 episodes which stopped around 4 AM.  Has not had a bowel movement since.  Continues to have some lower abdominal cramping after eating.  No vomiting.  No fevers no urinary symptoms.  She called a health hotline and they told her to come to the ER.    Past Medical History:  Diagnosis Date   Asthma    Bipolar disorder (HCC)    Family history of ovarian cancer    5/23 cancer genetic testing letter sent   Heart murmur    Panic attack     Patient Active Problem List   Diagnosis Date Noted   Bipolar disease during pregnancy, antepartum (HCC) 08/15/2021   Candidal vaginitis 08/08/2021   Supervision of high risk pregnancy, antepartum 07/31/2021   Missed period 07/29/2021   High risk medication use 06/13/2021   Acute cystitis with hematuria 05/06/2021   Long term current use of cannabis 04/23/2021   At risk for prolonged QT interval syndrome 04/23/2021   Noncompliance with treatment plan 04/23/2021   Bipolar and related disorder (HCC) 03/27/2021   GAD (generalized anxiety disorder) 03/27/2021   Cannabis use disorder, moderate, dependence (HCC) 03/27/2021   Asthma 02/10/2017   Need for immunization against influenza 01/27/2017   Episodic lightheadedness  12/19/2016   Depression with anxiety 10/09/2016     Physical Exam  Triage Vital Signs: ED Triage Vitals  Enc Vitals Group     BP 08/31/21 0812 113/67     Pulse Rate 08/31/21 0812 (!) 101     Resp 08/31/21 0812 19     Temp 08/31/21 0812 97.7 F (36.5 C)     Temp Source 08/31/21 0812 Oral     SpO2 08/31/21 0812 96 %     Weight 08/31/21 0811 130 lb (59 kg)     Height 08/31/21 0811 5\' 2"  (1.575 m)     Head Circumference --      Peak Flow --      Pain Score 08/31/21 0810 6     Pain Loc --      Pain Edu? --      Excl. in [place]? --     Most recent vital signs: Vitals:   08/31/21 0812  BP: 113/67  Pulse: (!) 101  Resp: 19  Temp: 97.7 F (36.5 C)  SpO2: 96%     General: Awake, no distress.  CV:  Good peripheral perfusion.  Resp:  Normal effort.  Abd:  No distention.  Mild tenderness to palpation the left lower quadrant but abdomen soft Neuro:             Awake, Alert, Oriented x 3  Other:  Normal external anal exam, no fissure, empty rectal vault no bleeding  ED Results / Procedures / Treatments  Labs (all labs ordered are listed, but only abnormal results are displayed) Labs Reviewed  COMPREHENSIVE METABOLIC PANEL - Abnormal; Notable for the following components:      Result Value   Sodium 132 (*)    Potassium 3.4 (*)    Glucose, Bld 128 (*)    Calcium 8.6 (*)    All other components within normal limits  CBC - Abnormal; Notable for the following components:   WBC 17.5 (*)    All other components within normal limits  URINALYSIS, ROUTINE W REFLEX MICROSCOPIC - Abnormal; Notable for the following components:   Color, Urine YELLOW (*)    APPearance HAZY (*)    Specific Gravity, Urine 1.031 (*)    Protein, ur 30 (*)    All other components within normal limits  POC URINE PREG, ED - Abnormal; Notable for the following components:   Preg Test, Ur POSITIVE (*)    All other components within normal limits  LIPASE, BLOOD      EKG     RADIOLOGY    PROCEDURES:  Critical Care performed: No  Procedures    MEDICATIONS ORDERED IN ED: Medications - No data to display   IMPRESSION / MDM / ASSESSMENT AND PLAN / ED COURSE  I reviewed the triage vital signs and the nursing notes.                              Differential diagnosis includes, but is not limited to, constipation, anal fissure, diverticular hemorrhage  Patient is a 26 year old female who is proximally [redacted] weeks pregnant presents with rectal bleeding and ongoing abdominal pain.  Seen here in the ED for severe constipation ultimately had an MRI to rule out appendicitis and other acute abdominal process given the severity of her pain.  She ultimately underwent an enema and had good results.  She had bright red blood per rectum upon discharge had several episodes of this and this is now stopped.  Has not had a BM since around 4 AM.  On exam she appears well she has some mild left lower quadrant abdominal tenderness but her abdomen is benign.  I am reassured that we have an MRI yesterday that was without acute process.  On rectal exam there is an empty rectal vault no bleeding.  Suspect that the bleeding was related to her severe constipation.  I am not surprised she is having some ongoing abdominal cramping.  Recommended clear liquid diet continuing MiraLAX and using suppository as needed.  Tylenol for pain.  She is otherwise appropriate for discharge.  Note patient's blood work does show leukocytosis but is downtrending from yesterday normal hemoglobin.  UA with some WBCs was negative yesterday and today has significant squames suspect this is a contaminant will not treat.      FINAL CLINICAL IMPRESSION(S) / ED DIAGNOSES   Final diagnoses:  Constipation, unspecified constipation type     Rx / DC Orders   ED Discharge Orders     None        Note:  This document was prepared using Dragon voice recognition software and may include  unintentional dictation errors.   Georga Hacking, MD 08/31/21 1134

## 2021-08-31 NOTE — ED Triage Notes (Signed)
Pt via POV from home. Pt was seen yesterday for constipation and was given an enema. Pt now c/o generalized abd pain, nausea, and rectal bleeding. States the rectal bleeding started after she was discharged yesterday. States the blood is bright pink. Denies any vomiting. Denies any urinary symptoms. Pt is [redacted] weeks pregnant. Pt is A&Ox4 and NAD

## 2021-09-03 ENCOUNTER — Ambulatory Visit: Admission: RE | Admit: 2021-09-03 | Payer: 59 | Source: Ambulatory Visit

## 2021-09-11 ENCOUNTER — Encounter: Payer: 59 | Admitting: Obstetrics

## 2021-09-13 ENCOUNTER — Encounter: Payer: Self-pay | Admitting: Licensed Practical Nurse

## 2021-09-13 ENCOUNTER — Ambulatory Visit (INDEPENDENT_AMBULATORY_CARE_PROVIDER_SITE_OTHER): Payer: 59 | Admitting: Licensed Practical Nurse

## 2021-09-13 VITALS — BP 118/62 | Wt 129.0 lb

## 2021-09-13 DIAGNOSIS — Z3480 Encounter for supervision of other normal pregnancy, unspecified trimester: Secondary | ICD-10-CM

## 2021-09-13 DIAGNOSIS — O99321 Drug use complicating pregnancy, first trimester: Secondary | ICD-10-CM

## 2021-09-13 DIAGNOSIS — Z3A11 11 weeks gestation of pregnancy: Secondary | ICD-10-CM

## 2021-09-13 DIAGNOSIS — F1210 Cannabis abuse, uncomplicated: Secondary | ICD-10-CM

## 2021-09-13 DIAGNOSIS — F411 Generalized anxiety disorder: Secondary | ICD-10-CM

## 2021-09-13 DIAGNOSIS — F319 Bipolar disorder, unspecified: Secondary | ICD-10-CM

## 2021-09-13 DIAGNOSIS — O99341 Other mental disorders complicating pregnancy, first trimester: Secondary | ICD-10-CM

## 2021-09-13 LAB — POCT URINALYSIS DIPSTICK OB
Glucose, UA: NEGATIVE
POC,PROTEIN,UA: NEGATIVE

## 2021-09-13 NOTE — Progress Notes (Signed)
Routine Prenatal Care Visit  Subjective  Allison Black is a 26 y.o. [phone] at [redacted]w[redacted]d being seen today for ongoing prenatal care.  She is currently monitored for the following issues for this high-risk pregnancy and has Depression with anxiety; Episodic lightheadedness; Need for immunization against influenza; Asthma; Bipolar and related disorder ([place]); GAD (generalized anxiety disorder); Cannabis use disorder, moderate, dependence ([place]); Long term current use of cannabis; At risk for prolonged QT interval syndrome; Noncompliance with treatment plan; Acute cystitis with hematuria; High risk medication use; Missed period; Supervision of high risk pregnancy, antepartum; Candidal vaginitis; and Bipolar disease during pregnancy, antepartum ([place]) on their problem list.  ----------------------------------------------------------------------------------- Patient reports Here with partner. Doing ok.  Was seen in the ED for constipation, feels better after treatment but does have some gas and cramps occasionally. She was told while there that the subchorionic hemorrhage has gotten bigger, denies any vaginal bleeding.  Mood has been good overall, admits to not getting excited about pregnancy as she is aware miscarriage could happen.  -This is her partner's first baby and first time being around a newborn, recommend the Birth Partner and looking into newborn classes.  Contractions: Not present. Vag. Bleeding: None.  Movement: Absent. Leaking Fluid denies.  ----------------------------------------------------------------------------------- The following portions of the patient's history were reviewed and updated as appropriate: allergies, current medications, past family history, past medical history, past social history, past surgical history and problem list. Problem list updated.  Objective  Blood pressure 118/62, weight 129 lb (58.5 kg), last menstrual period 06/22/2021. Pregravid weight 119 lb (54 kg) Total  Weight Gain 10 lb (4.536 kg) Urinalysis: Urine Protein Negative  Urine Glucose Negative  Fetal Status: Fetal Heart Rate (bpm): 166   Movement: Absent     General:  Alert, oriented and cooperative. Patient is in no acute distress.  Skin: Skin is warm and dry. No rash noted.   Cardiovascular: Normal heart rate noted  Respiratory: Normal respiratory effort, no problems with respiration noted  Abdomen: Soft, gravid, appropriate for gestational age. Pain/Pressure: Present     Pelvic:  Cervical exam deferred        Extremities: Normal range of motion.  Edema: None  Mental Status: Normal mood and affect. Normal behavior. Normal judgment and thought content.   Assessment   26 y.o. [id number] at [redacted]w[redacted]d by  03/29/2022, by Last Menstrual Period presenting for routine prenatal visit  Plan   fourth Problems (from 07/31/21 to present)     Problem Noted Resolved   Supervision of high risk pregnancy, antepartum 07/31/2021 by Cleophas Dunker, CMA No   Overview Addendum 09/13/2021  8:22 AM by Allen Derry, [place] Staff Provider  Office Location  [facility] Dating  [redacted]w[redacted]d on 5/12 sub hem   Language  English  Anatomy US    Flu Vaccine   Genetic Screen  NIPS:   TDaP vaccine    Hgb A1C or  GTT Early : Third trimester :   Covid vaccinated   LAB RESULTS   Rhogam  NA Blood Type B/Positive/-- (05/17 1027)   Feeding Plan Breast? Antibody Negative (05/17 1027)  Contraception  Rubella 3.04 (05/17 1027)  Circumcision  RPR Non Reactive (05/17 1027)   Pediatrician   HBsAg Negative (05/17 1027)   Support Person  HIV Non Reactive (05/17 1027)  Prenatal Classes  Varicella Immune     GBS  (For PCN allergy, check sensitivities)   BTL Consent     VBAC Consent  Pap  ASC-US HPV neg 05/2020    Hgb Electro    Pelvis Tested  CF      SMA                    general obstetric precautions including but not limited to vaginal bleeding, contractions, leaking of fluid and fetal movement were reviewed in  detail with the patient. Please refer to After Visit Summary for other counseling recommendations.   Return in about 4 weeks (around 10/11/2021) for ROB.  Genetic screening collected   Roberto Scales, [place], [facility]  09/13/21  9:48 AM

## 2021-09-19 ENCOUNTER — Encounter: Payer: Self-pay | Admitting: Emergency Medicine

## 2021-09-19 ENCOUNTER — Emergency Department: Payer: 59

## 2021-09-19 ENCOUNTER — Emergency Department
Admission: EM | Admit: 2021-09-19 | Discharge: 2021-09-19 | Disposition: A | Discharge status: Home / Self Care | Payer: 59 | Attending: Emergency Medicine | Admitting: Emergency Medicine

## 2021-09-19 DIAGNOSIS — Z3689 Encounter for other specified antenatal screening: Secondary | ICD-10-CM | POA: Diagnosis not present

## 2021-09-19 DIAGNOSIS — Z3A12 12 weeks gestation of pregnancy: Secondary | ICD-10-CM | POA: Insufficient documentation

## 2021-09-19 DIAGNOSIS — J45909 Unspecified asthma, uncomplicated: Secondary | ICD-10-CM | POA: Diagnosis not present

## 2021-09-19 DIAGNOSIS — Z7951 Long term (current) use of inhaled steroids: Secondary | ICD-10-CM | POA: Diagnosis not present

## 2021-09-19 DIAGNOSIS — Z3A00 Weeks of gestation of pregnancy not specified: Secondary | ICD-10-CM | POA: Diagnosis not present

## 2021-09-19 DIAGNOSIS — O039 Complete or unspecified spontaneous abortion without complication: Secondary | ICD-10-CM | POA: Diagnosis not present

## 2021-09-19 DIAGNOSIS — O0289 Other abnormal products of conception: Secondary | ICD-10-CM | POA: Insufficient documentation

## 2021-09-19 DIAGNOSIS — O209 Hemorrhage in early pregnancy, unspecified: Secondary | ICD-10-CM | POA: Diagnosis present

## 2021-09-19 LAB — COMPREHENSIVE METABOLIC PANEL
ALT: 13 U/L (ref 0–44)
AST: 22 U/L (ref 15–41)
Albumin: 3.6 g/dL (ref 3.5–5.0)
Alkaline Phosphatase: 51 U/L (ref 38–126)
Anion gap: 7 (ref 5–15)
BUN: 11 mg/dL (ref 6–20)
CO2: 22 mmol/L (ref 22–32)
Calcium: 9.1 mg/dL (ref 8.9–10.3)
Chloride: 108 mmol/L (ref 98–111)
Creatinine, Ser: 0.45 mg/dL (ref 0.44–1.00)
GFR, Estimated: >60 mL/min (ref >60)
Glucose, Bld: 92 mg/dL (ref 70–99)
Potassium: 3.6 mmol/L (ref 3.5–5.1)
Sodium: 137 mmol/L (ref 135–145)
Total Bilirubin: 0.5 mg/dL (ref 0.3–1.2)
Total Protein: 7 g/dL (ref 6.5–8.1)

## 2021-09-19 LAB — CBC WITH DIFFERENTIAL/PLATELET
Abs Immature Granulocytes: 0.03 10*3/uL (ref 0.00–0.07)
Basophils Absolute: 0.1 10*3/uL (ref 0.0–0.1)
Basophils Relative: 1 %
Eosinophils Absolute: 0.2 10*3/uL (ref 0.0–0.5)
Eosinophils Relative: 2 %
HCT: 36.1 % (ref 36.0–46.0)
Hemoglobin: 12 g/dL (ref 12.0–15.0)
Immature Granulocytes: 0 %
Lymphocytes Relative: 39 %
Lymphs Abs: 3.2 10*3/uL (ref 0.7–4.0)
MCH: 29.5 pg (ref 26.0–34.0)
MCHC: 33.2 g/dL (ref 30.0–36.0)
MCV: 88.7 fL (ref 80.0–100.0)
Monocytes Absolute: 0.7 10*3/uL (ref 0.1–1.0)
Monocytes Relative: 9 %
Neutro Abs: 4.1 10*3/uL (ref 1.7–7.7)
Neutrophils Relative %: 49 %
Platelets: 259 10*3/uL (ref 150–400)
RBC: 4.07 MIL/uL (ref 3.87–5.11)
RDW: 12.9 % (ref 11.5–15.5)
WBC: 8.3 10*3/uL (ref 4.0–10.5)
nRBC: 0 % (ref 0.0–0.2)

## 2021-09-19 LAB — HCG, QUANTITATIVE, PREGNANCY: hCG, Beta Chain, Quant, S: 5738 m[IU]/mL — ABNORMAL HIGH (ref <5)

## 2021-09-19 MED ORDER — IBUPROFEN 800 MG PO TABS: 800.0000 mg | ORAL_TABLET | Freq: Three times a day (TID) | ORAL | 0 refills | Status: DC | PRN

## 2021-09-19 MED ORDER — OXYCODONE-ACETAMINOPHEN 5-325 MG PO TABS
1.0000 | ORAL_TABLET | Freq: Once | ORAL | Status: AC
  Administered 2021-09-19: 1 via ORAL
  Filled 2021-09-19: qty 1

## 2021-09-19 MED ORDER — ONDANSETRON 4 MG PO TBDP
4.0000 mg | ORAL_TABLET | Freq: Once | ORAL | Status: AC
  Administered 2021-09-19: 4 mg via ORAL
  Filled 2021-09-19: qty 1

## 2021-09-19 MED ORDER — ONDANSETRON 4 MG PO TBDP: 4.0000 mg | ORAL_TABLET | Freq: Four times a day (QID) | ORAL | 0 refills | Status: DC | PRN

## 2021-09-19 MED ORDER — OXYCODONE-ACETAMINOPHEN 5-325 MG PO TABS
1.0000 | ORAL_TABLET | ORAL | 0 refills | Status: DC | PRN
Start: 2021-09-19 — End: 2022-04-09

## 2021-09-19 NOTE — ED Triage Notes (Signed)
Pt presents via POV with complaints of vaginal bleeding and is ~[redacted] weeks pregnant that started 3 days ago and passed a large clot tonight. She notes having a miscarriage before and she states the same sx are happening, abdominal cramping, and bleeding.

## 2021-09-19 NOTE — Discharge Instructions (Addendum)

## 2021-09-19 NOTE — ED Provider Notes (Signed)
[facility] Provider Note    Event Date/Time   First MD Initiated Contact with Patient 09/19/21 0601     (approximate)   History   Vaginal Bleeding   HPI  Shawana Knoch is a 26 y.o. female with history of bipolar disorder, asthma who presents to the emergency department vaginal bleeding in pregnancy.  Last menstrual cycle was 06/22/2021.  Has had an ultrasound showing an IUP and has had a subchorionic hemorrhage.  Has been spotting intermittently but states last night started having heavy bleeding, passing clots and pressure.  Pain and bleeding have improved.  She states that she has had a previous miscarriage before and believes that she is having another.  No fevers, vomiting, diarrhea, dysuria, vaginal discharge.  Patient is a [id number].  Last OB/GYN visit was 09/13/2021.  Patient 12 weeks and 5 days based on LMP.   History provided by patient.    Past Medical History:  Diagnosis Date   Asthma    Bipolar disorder (HCC)    Family history of ovarian cancer    5/23 cancer genetic testing letter sent   Heart murmur    Panic attack     Past Surgical History:  Procedure Laterality Date   NO PAST SURGERIES      MEDICATIONS:  Prior to Admission medications   Medication Sig Start Date End Date Taking? Authorizing Provider  albuterol (VENTOLIN HFA) 108 (90 Base) MCG/ACT inhaler Inhale into the lungs. Patient not taking: Reported on 08/21/2021 01/01/21   [provider]  ARIPiprazole (ABILIFY) 5 MG tablet Take 0.5 tablets (2.5 mg total) by mouth daily. Patient not taking: Reported on 08/19/2021 08/19/21   Jomarie Longs, MD  FLUoxetine (PROZAC) 20 MG capsule Take 1 capsule (20 mg total) by mouth daily with breakfast. Patient not taking: Reported on 09/13/2021 08/19/21   Jomarie Longs, MD  gabapentin (NEURONTIN) 300 MG capsule Take 1 capsule (300 mg total) by mouth as directed. Take 1 capsule daily at 7 AM , 1 cap at 1 PM , 2 Caps at bedtime Patient  not taking: Reported on 08/08/2021 06/13/21   Jomarie Longs, MD  hydrOXYzine (ATARAX) 10 MG tablet Take 1 tablet (10 mg total) by mouth 3 (three) times daily as needed for anxiety. Patient not taking: Reported on 08/21/2021 06/13/21   Jomarie Longs, MD  ondansetron (ZOFRAN-ODT) 4 MG disintegrating tablet Take 1 tablet (4 mg total) by mouth every 6 (six) hours as needed for nausea. Patient not taking: Reported on 09/13/2021 08/21/21   Dominic, Courtney Heys, CNM  polyethylene glycol (MIRALAX) 17 g packet Take 17 g by mouth daily. 08/30/21 09/29/21  Georga Hacking, MD  promethazine (PHENERGAN) 25 MG tablet Take 1 tablet (25 mg total) by mouth every 6 (six) hours as needed for nausea or vomiting. Patient not taking: Reported on 09/13/2021 08/08/21   Copland, Ilona Sorrel, PA-C  terconazole (TERAZOL 7) 0.4 % vaginal cream Place 1 applicator vaginally at bedtime. Patient not taking: Reported on 08/19/2021 08/08/21   Trenda Moots    Physical Exam   Triage Vital Signs: ED Triage Vitals  Enc Vitals Group     BP 09/19/21 0220 120/78     Pulse Rate 09/19/21 0220 75     Resp 09/19/21 0220 20     Temp 09/19/21 0220 97.9 F (36.6 C)     Temp Source 09/19/21 0220 Oral     SpO2 09/19/21 0220 98 %     Weight 09/19/21 0218 132  lb 4.4 oz (60 kg)     Height 09/19/21 0218 5\' 2"  (1.575 m)     Head Circumference --      Peak Flow --      Pain Score 09/19/21 0218 0     Pain Loc --      Pain Edu? --      Excl. in GC? --     Most recent vital signs: Vitals:   09/19/21 0220 09/19/21 0616  BP: 120/78 129/88  Pulse: 75 91  Resp: 20 20  Temp: 97.9 F (36.6 C) 98 F (36.7 C)  SpO2: 98% 99%    CONSTITUTIONAL: Alert and oriented and responds appropriately to questions. Well-appearing; well-nourished, tearful HEAD: Normocephalic, atraumatic EYES: Conjunctivae clear, pupils appear equal, sclera nonicteric ENT: normal nose; moist mucous membranes NECK: Supple, normal ROM CARD: RRR; S1 and S2 appreciated;  no murmurs, no clicks, no rubs, no gallops RESP: Normal chest excursion without splinting or tachypnea; breath sounds clear and equal bilaterally; no wheezes, no rhonchi, no rales, no hypoxia or respiratory distress, speaking full sentences ABD/GI: Normal bowel sounds; non-distended; soft, non-tender, no rebound, no guarding, no peritoneal signs BACK: The back appears normal EXT: Normal ROM in all joints; no deformity noted, no edema; no cyanosis SKIN: Normal color for age and race; warm; no rash on exposed skin NEURO: Moves all extremities equally, normal speech PSYCH: The patient's mood and manner are appropriate.   ED Results / Procedures / Treatments   LABS: (all labs ordered are listed, but only abnormal results are displayed) Labs Reviewed  HCG, QUANTITATIVE, PREGNANCY - Abnormal; Notable for the following components:      Result Value   hCG, Beta Chain, Quant, S 5,738 (*)    All other components within normal limits  COMPREHENSIVE METABOLIC PANEL  CBC WITH DIFFERENTIAL/PLATELET  URINALYSIS, ROUTINE W REFLEX MICROSCOPIC     EKG:   RADIOLOGY: My personal review and interpretation of imaging: Ultrasound consistent with spontaneous abortion that is complete.  I have personally reviewed all radiology reports.   08-04-1972 OB Comp Less 14 Wks  Result Date: 09/19/2021 CLINICAL DATA:  26 year old pregnant female with history of vaginal bleeding for the past 3 days. EXAM: OBSTETRIC <14 WK ULTRASOUND TECHNIQUE: Transabdominal ultrasound was performed for evaluation of the gestation as well as the maternal uterus and adnexal regions. COMPARISON:  OB ultrasound 08/30/2021. FINDINGS: Intrauterine gestational sac: None. Yolk sac:  None. Embryo:  None. Cardiac Activity: None. Heart Rate: N/A Subchorionic hemorrhage:  None visualized. Maternal uterus/adnexae: Uterus and ovaries are grossly unremarkable in appearance. IMPRESSION: 1. No IUP identified. Given the recent AP and previously demonstrated  subchorionic hemorrhage, findings are indicative of spontaneous abortion. Electronically Signed   By: 09/01/2021 M.D.   On: 09/19/2021 05:04     PROCEDURES:  Critical Care performed: No    Procedures    IMPRESSION / MDM / ASSESSMENT AND PLAN / ED COURSE  I reviewed the triage vital signs and the nursing notes.    Patient here with vaginal bleeding in pregnancy.  History of previous miscarriage and states this feels the same.     DIFFERENTIAL DIAGNOSIS (includes but not limited to):   Spontaneous abortion, subchorionic hemorrhage, STD, UTI, doubt appendicitis, torsion, TOA, ectopic   Patient's presentation is most consistent with acute presentation with potential threat to life or bodily function.   PLAN: We will obtain CBC, hCG, BMP, OB ultrasound.  Will give pain and nausea medicine.  Took ibuprofen prior to arrival  without much relief.   MEDICATIONS GIVEN IN ED: Medications  ondansetron (ZOFRAN-ODT) disintegrating tablet 4 mg (4 mg Oral Given 09/19/21 0616)  oxyCODONE-acetaminophen (PERCOCET/ROXICET) 5-325 MG per tablet 1 tablet (1 tablet Oral Given 09/19/21 0616)     ED COURSE: Patient's labs show normal hemoglobin.  No leukocytosis.  Normal electrolytes.  Normal renal function.  hCG is 5700.  OB ultrasound reviewed by myself and radiologist and shows no IUP.  Findings indicative of spontaneous abortion and appears complete in nature.  Abdominal exam is currently benign.  She is not actively hemorrhaging.  Hemodynamically stable.  Patient is visibly in reasonably upset.  Offered reassurance, condolences.  She has OB/GYN follow-up as needed.  Discussed bleeding return precautions.  Will provide with pain medication.  At this time, I do not feel there is any life-threatening condition present. I reviewed all nursing notes, vitals, pertinent previous records.  All lab and urine results, EKGs, imaging ordered have been independently reviewed and interpreted by myself.  I  reviewed all available radiology reports from any imaging ordered this visit.  Based on my assessment, I feel the patient is safe to be discharged home without further emergent workup and can continue workup as an outpatient as needed. Discussed all findings, treatment plan as well as usual and customary return precautions with patient.  They verbalize understanding and are comfortable with this plan.  Outpatient follow-up has been provided as needed.  All questions have been answered.    CONSULTS: No emergent OB/GYN consult needed at this time.  Spontaneous abortion appears complete and she is not hemorrhaging, hemodynamically stable with normal hemoglobin.  She states bleeding has slowed down significantly.   OUTSIDE RECORDS REVIEWED: Reviewed patient's most recent OB/GYN note on 09/13/2021.       FINAL CLINICAL IMPRESSION(S) / ED DIAGNOSES   Final diagnoses:  Spon abortion w/o complication     Rx / DC Orders   ED Discharge Orders          Ordered    oxyCODONE-acetaminophen (PERCOCET/ROXICET) 5-325 MG tablet  Every 4 hours PRN        09/19/21 0613    ibuprofen (ADVIL) 800 MG tablet  Every 8 hours PRN        09/19/21 0613    ondansetron (ZOFRAN-ODT) 4 MG disintegrating tablet  Every 6 hours PRN        09/19/21 [id number]             Note:  This document was prepared using Dragon voice recognition software and may include unintentional dictation errors.   Matteo Banke, Delice Bison, DO 09/19/21 [phone]

## 2021-09-21 LAB — MATERNIT 21 PLUS CORE, BLOOD
Fetal Fraction: 5
Result (T21): NEGATIVE
Trisomy 13 (Patau syndrome): NEGATIVE
Trisomy 18 (Edwards syndrome): NEGATIVE
Trisomy 21 (Down syndrome): NEGATIVE

## 2021-10-10 ENCOUNTER — Telehealth (HOSPITAL_COMMUNITY): Payer: Self-pay | Admitting: Licensed Clinical Social Worker

## 2021-10-10 ENCOUNTER — Ambulatory Visit (INDEPENDENT_AMBULATORY_CARE_PROVIDER_SITE_OTHER): Payer: Self-pay | Admitting: Licensed Clinical Social Worker

## 2021-10-10 DIAGNOSIS — Z91199 Patient's noncompliance with other medical treatment and regimen due to unspecified reason: Secondary | ICD-10-CM

## 2021-10-10 NOTE — Progress Notes (Signed)
LCSW counselor tried to connect with patient for scheduled appointment via MyChart video text request x 2 and email request; also tried to connect via phone without success. LCSW counselor left message for patient to call office number to reschedule OPT appointment.   Attempt 1: Text and email: 4:06p  Attempt 2: Text and email: 4:10p  Attempt 3: phone call: 4:15p (left message)  Visit will be coded as a NO SHOW  

## 2021-10-10 NOTE — Telephone Encounter (Signed)
LCSW counselor tried to connect with patient for scheduled appointment via MyChart video text request x 2 and email request; also tried to connect via phone without success. LCSW counselor left message for patient to call office number to reschedule OPT appointment.   Attempt 1: Text and email: 4:06p  Attempt 2: Text and email: 4:10p  Attempt 3: phone call: 4:15p (left message)  Visit will be coded as a NO SHOW

## 2021-10-11 ENCOUNTER — Encounter: Payer: 59 | Admitting: Advanced Practice Midwife

## 2021-10-15 ENCOUNTER — Ambulatory Visit: Payer: 59 | Admitting: Family Medicine

## 2022-02-12 ENCOUNTER — Other Ambulatory Visit (HOSPITAL_COMMUNITY)
Admission: RE | Admit: 2022-02-12 | Discharge: 2022-02-12 | Disposition: A | Discharge status: Home / Self Care | Payer: 59 | Source: Ambulatory Visit | Attending: Licensed Practical Nurse | Admitting: Licensed Practical Nurse

## 2022-02-12 ENCOUNTER — Ambulatory Visit (INDEPENDENT_AMBULATORY_CARE_PROVIDER_SITE_OTHER): Payer: 59

## 2022-02-12 VITALS — BP 111/71 | HR 71 | Wt 132.0 lb

## 2022-02-12 DIAGNOSIS — N898 Other specified noninflammatory disorders of vagina: Secondary | ICD-10-CM | POA: Insufficient documentation

## 2022-02-12 NOTE — Progress Notes (Unsigned)
SUBJECTIVE:  26 y.o. female complains of white yellowish vaginal discharge. Denies abnormal vaginal bleeding or significant pelvic pain or fever. No UTI symptoms. Denies history of known exposure to STD.  No LMP recorded.  OBJECTIVE:  She appears well, afebrile. Abdomen: N/A Pelvic Exam: N/A Urine dipstick: N/A  PLAN:  GC and chlamydia DNA  probe sent to lab. Treatment:  ROV prn if symptoms persist or worsen.

## 2022-02-13 LAB — CERVICOVAGINAL ANCILLARY ONLY
Bacterial Vaginitis (gardnerella): POSITIVE — AB
Candida Glabrata: NEGATIVE
Candida Vaginitis: NEGATIVE
Chlamydia: NEGATIVE
Comment: NEGATIVE
Comment: NEGATIVE
Comment: NEGATIVE
Comment: NEGATIVE
Comment: NEGATIVE
Comment: NORMAL
Neisseria Gonorrhea: NEGATIVE
Trichomonas: POSITIVE — AB

## 2022-02-14 ENCOUNTER — Telehealth: Payer: Self-pay

## 2022-02-14 DIAGNOSIS — A599 Trichomoniasis, unspecified: Secondary | ICD-10-CM

## 2022-02-14 DIAGNOSIS — B9689 Other specified bacterial agents as the cause of diseases classified elsewhere: Secondary | ICD-10-CM

## 2022-02-14 MED ORDER — METRONIDAZOLE 500 MG PO TABS: 500.0000 mg | ORAL_TABLET | Freq: Two times a day (BID) | ORAL | 0 refills | Status: DC

## 2022-02-14 NOTE — Telephone Encounter (Signed)
Patient contacted office because she received notice in her mychart that results of her Cervicovaginal swab had returned back positive for BV and Trichomonas. Patient was seen for nurse visit on 02/12/22 and had did self swab. After reviewing results with Dr. Valentino Saxon she stated that a 7 day of Flagyl would be appropriate to treat. Will notify patient. KW

## 2022-02-16 ENCOUNTER — Other Ambulatory Visit: Payer: Self-pay | Admitting: Psychiatry

## 2022-02-16 DIAGNOSIS — F411 Generalized anxiety disorder: Secondary | ICD-10-CM

## 2022-02-21 ENCOUNTER — Other Ambulatory Visit: Payer: Self-pay | Admitting: Licensed Practical Nurse

## 2022-02-21 ENCOUNTER — Telehealth: Payer: Self-pay

## 2022-02-21 DIAGNOSIS — R630 Anorexia: Secondary | ICD-10-CM

## 2022-02-21 NOTE — Telephone Encounter (Signed)
Pt is calling wanting a referral to a GI,having some stomach issues. I advised I thought she would have to have a PCP, but I told her I would ask Isabelle Course since we saw her in June this year. Please advise if we can send this referral or if she needs a PCP to do it
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
## 2022-03-08 ENCOUNTER — Other Ambulatory Visit: Payer: Self-pay | Admitting: Psychiatry

## 2022-03-08 DIAGNOSIS — F3132 Bipolar disorder, current episode depressed, moderate: Secondary | ICD-10-CM

## 2022-03-19 ENCOUNTER — Telehealth: Payer: Self-pay | Admitting: Psychiatry

## 2022-03-19 DIAGNOSIS — F3132 Bipolar disorder, current episode depressed, moderate: Secondary | ICD-10-CM

## 2022-03-19 DIAGNOSIS — F411 Generalized anxiety disorder: Secondary | ICD-10-CM

## 2022-03-19 NOTE — Telephone Encounter (Signed)
Patient called requesting refill on previous medications prescribed by you. These are not in her Mychart now. But states she was on Abilify, gabapentin, hydrocloxine, and prozac. I spelled the best I could. Please send to [place], [address]

## 2022-03-20 MED ORDER — HYDROXYZINE PAMOATE 25 MG PO CAPS: 25.0000 mg | ORAL_CAPSULE | Freq: Two times a day (BID) | ORAL | 1 refills | Status: AC | PRN

## 2022-03-20 MED ORDER — FLUOXETINE HCL 20 MG PO CAPS: 20.0000 mg | ORAL_CAPSULE | Freq: Every day | ORAL | 1 refills | Status: DC

## 2022-03-20 MED ORDER — ARIPIPRAZOLE 2 MG PO TABS: 2.0000 mg | ORAL_TABLET | Freq: Every day | ORAL | 1 refills | Status: DC

## 2022-03-20 NOTE — Telephone Encounter (Signed)
I have restarted patient on low-dose of Abilify 2 mg, Prozac 40 mg and hydroxyzine 25 mg twice a day as needed.  I have not started her back on the gabapentin per our discussion at her last visit.  Her last visit was in May 2023.  Patient will need to make sure she is comes in for her appointment scheduled in January, will not be giving future refills.  Will have staff contact patient to discuss no-show policy as well as noncompliance policy.

## 2022-04-09 ENCOUNTER — Other Ambulatory Visit: Payer: Commercial Managed Care - PPO

## 2022-04-09 ENCOUNTER — Other Ambulatory Visit: Payer: Self-pay

## 2022-04-09 ENCOUNTER — Encounter: Payer: Self-pay | Admitting: Gastroenterology

## 2022-04-09 ENCOUNTER — Ambulatory Visit (INDEPENDENT_AMBULATORY_CARE_PROVIDER_SITE_OTHER): Payer: Commercial Managed Care - PPO | Admitting: Gastroenterology

## 2022-04-09 VITALS — BP 102/66 | HR 71 | Temp 98.3°F | Ht 62.0 in | Wt 132.6 lb

## 2022-04-09 DIAGNOSIS — Z7729 Contact with and (suspected ) exposure to other hazardous substances: Secondary | ICD-10-CM

## 2022-04-09 DIAGNOSIS — R112 Nausea with vomiting, unspecified: Secondary | ICD-10-CM

## 2022-04-09 MED ORDER — OMEPRAZOLE 20 MG PO CPDR: 20.0000 mg | DELAYED_RELEASE_CAPSULE | Freq: Two times a day (BID) | ORAL | 5 refills | Status: DC
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
## 2022-04-09 NOTE — Addendum Note (Signed)
Addended by: Wayna Chalet on: 04/09/2022 02:14 PM   Modules accepted: Orders

## 2022-04-21 ENCOUNTER — Ambulatory Visit: Payer: Self-pay | Admitting: Psychiatry

## 2022-04-23 ENCOUNTER — Encounter: Payer: Self-pay | Admitting: Gastroenterology

## 2022-04-24 ENCOUNTER — Encounter: Admission: RE | Payer: Self-pay | Source: Ambulatory Visit

## 2022-04-24 ENCOUNTER — Ambulatory Visit
Admission: RE | Admit: 2022-04-24 | Payer: Commercial Managed Care - PPO | Source: Ambulatory Visit | Admitting: Gastroenterology

## 2022-04-24 ENCOUNTER — Encounter: Payer: Self-pay | Admitting: Anesthesiology

## 2022-04-24 SURGERY — ESOPHAGOGASTRODUODENOSCOPY (EGD) WITH PROPOFOL
Anesthesia: General

## 2022-05-07 ENCOUNTER — Ambulatory Visit: Payer: Self-pay | Admitting: Psychiatry

## 2022-07-06 IMAGING — US US OB < 14 WEEKS - US OB TV
1 series · 14 of 28 positions shown · non-contrast
Comparison: None Available.

CLINICAL DATA: Spotting in early pregnancy

EXAM:
OBSTETRIC <14 WK US AND TRANSVAGINAL OB US
TECHNIQUE: Both transabdominal and transvaginal ultrasound examinations were
performed for complete evaluation of the gestation as well as the
maternal uterus, adnexal regions, and pelvic cul-de-sac.
Transvaginal technique was performed to assess early pregnancy.

[Series 1: us ob less than 14 weeks with ob transvaginal · 113 acquisitions, 14 frames shown]
[im 5/113]
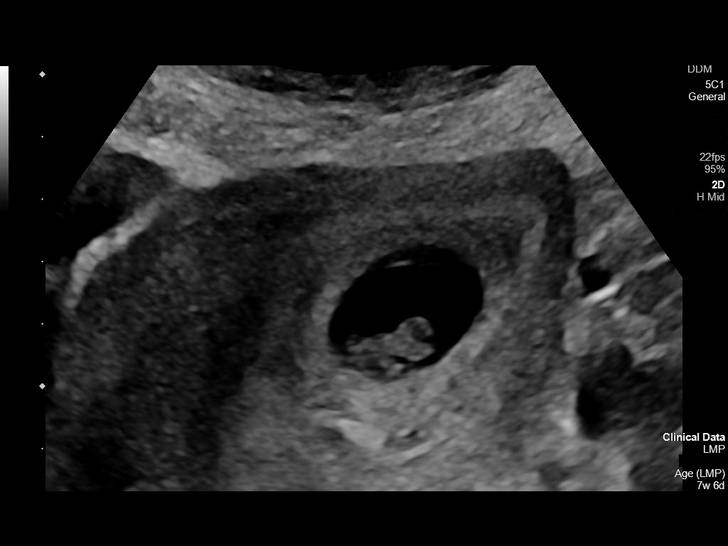
[im 13/113]
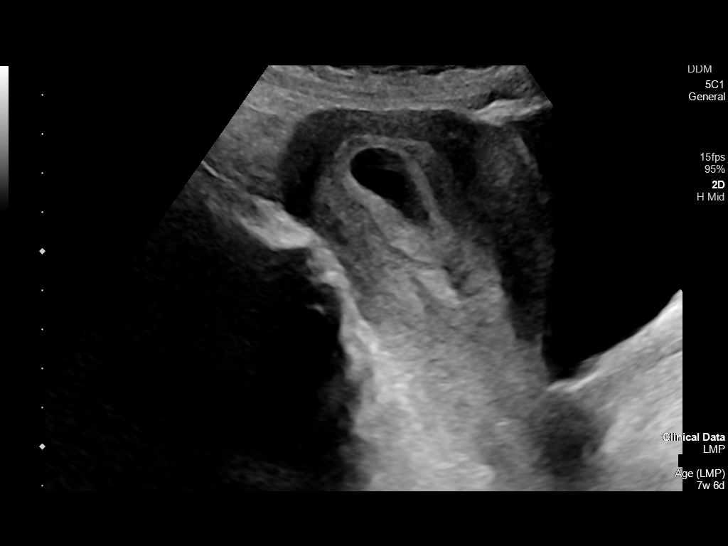
[im 21/113]
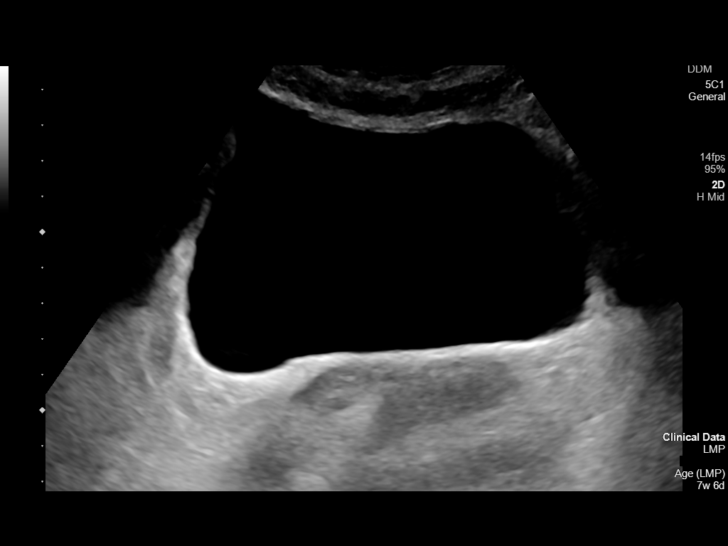
[im 30/113]
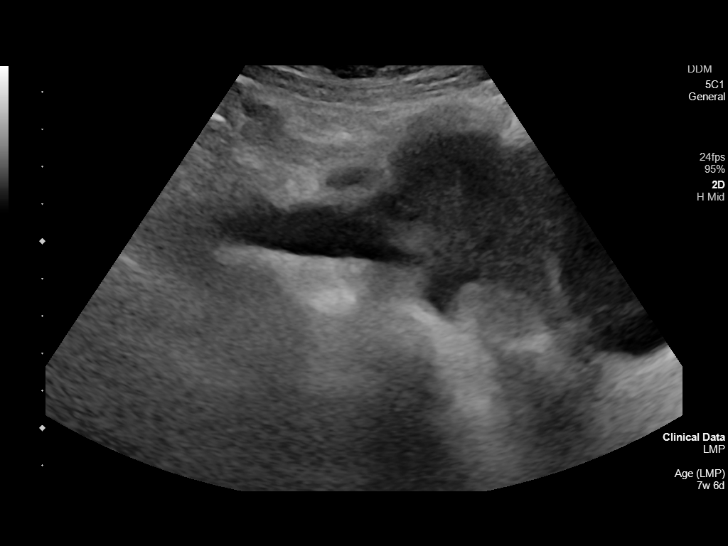
[im 38/113]
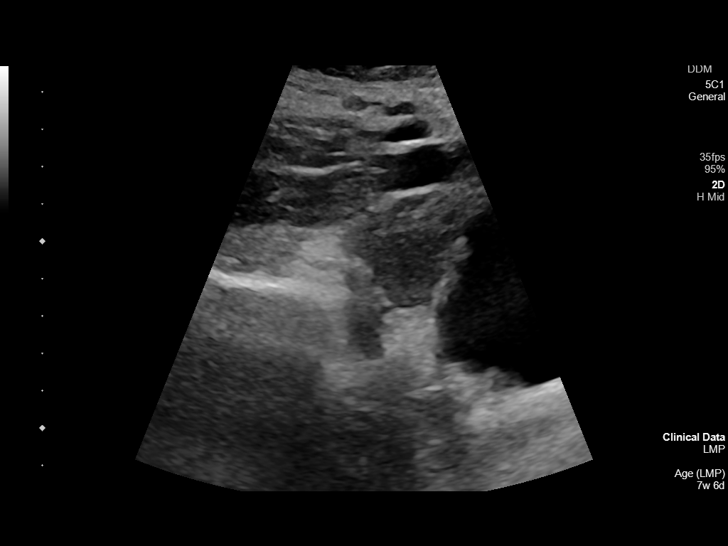
[im 46/113]
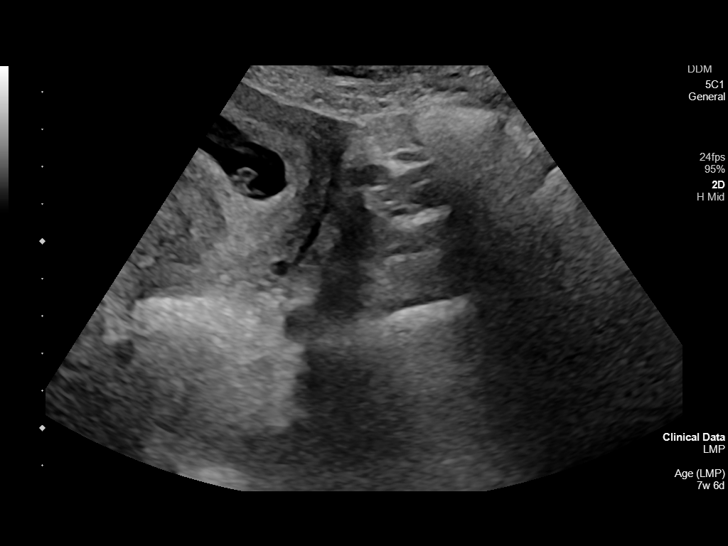
[im 54/113]
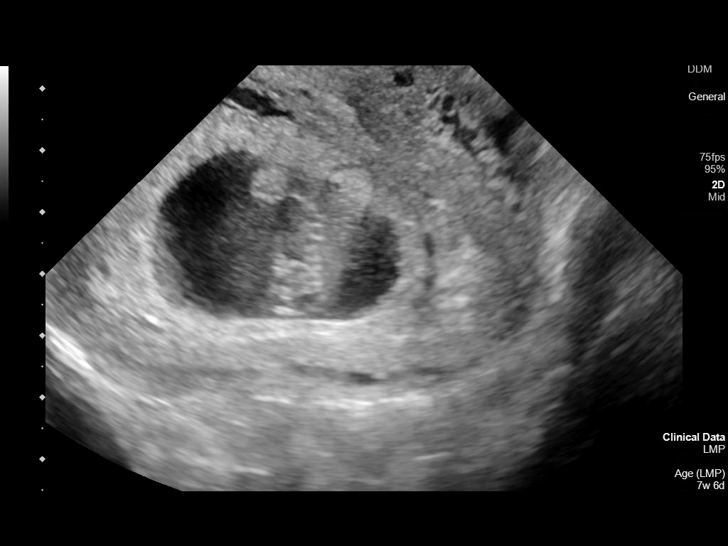
[im 63/113]
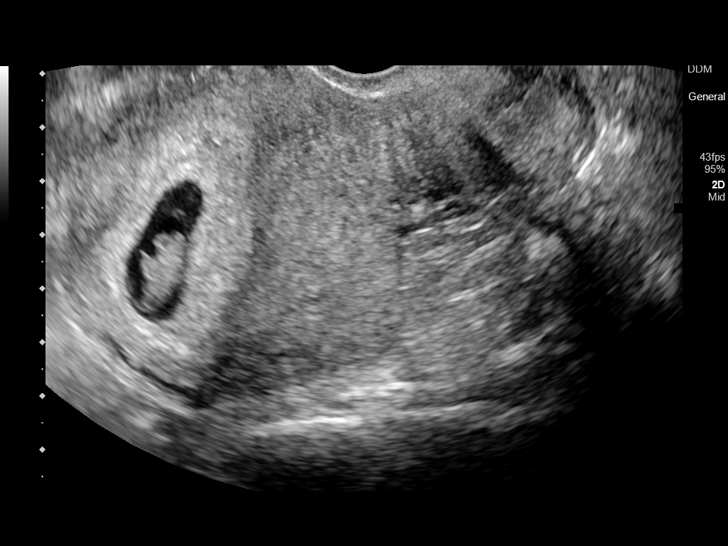
[im 71/113]
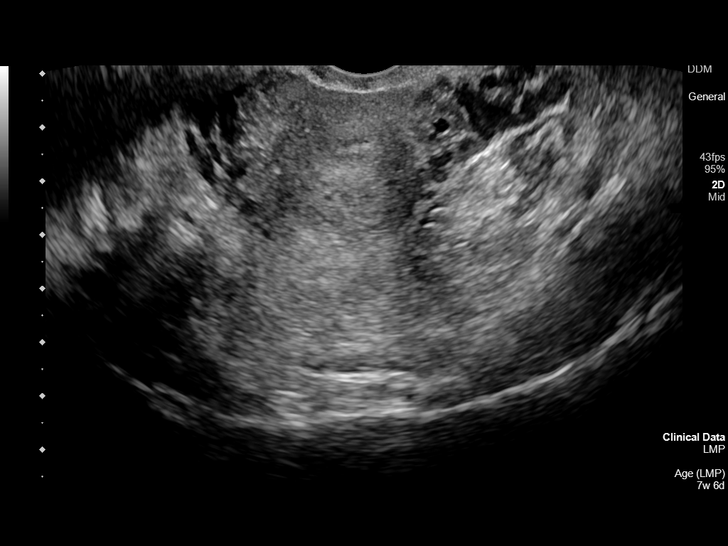
[im 79/113]
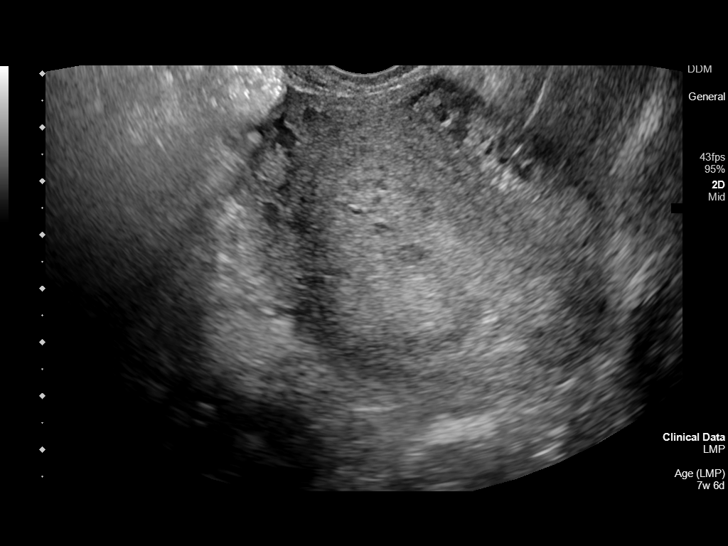
[im 88/113]
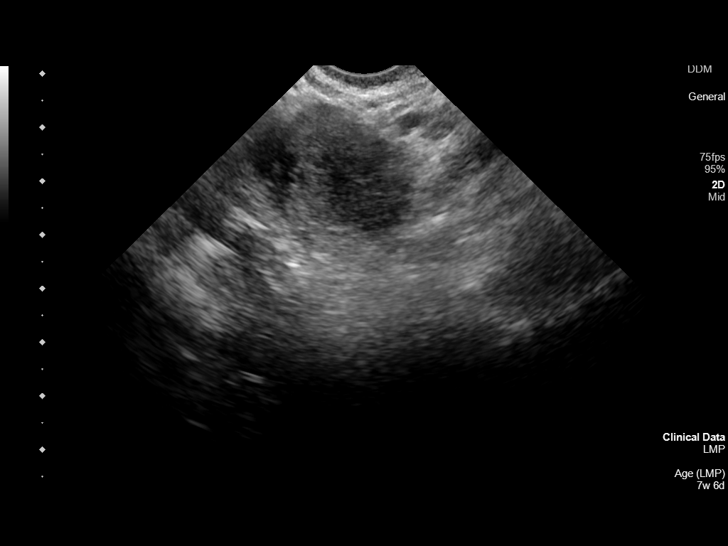
[im 96/113]
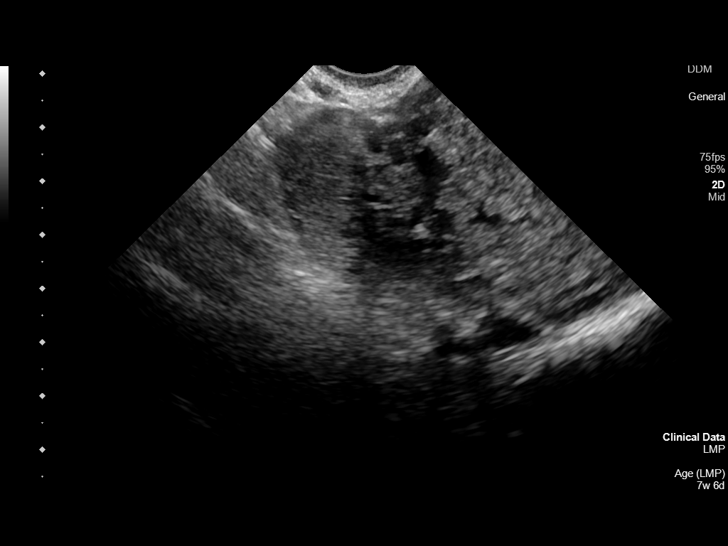
[im 104/113]
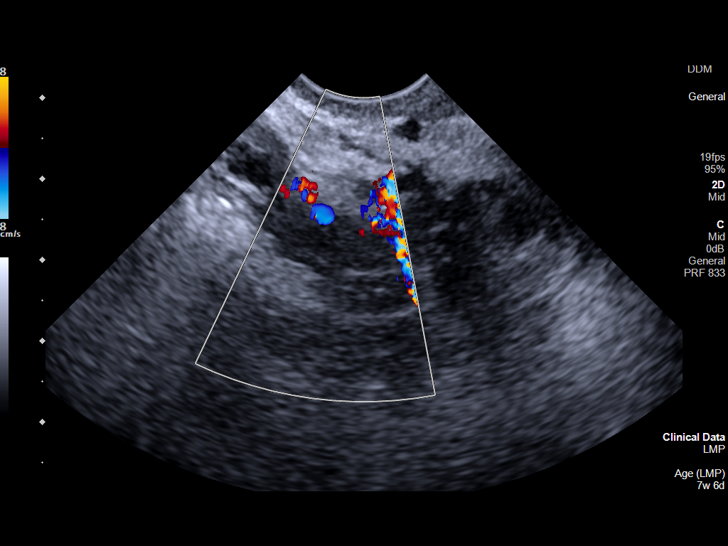
[im 113/113]
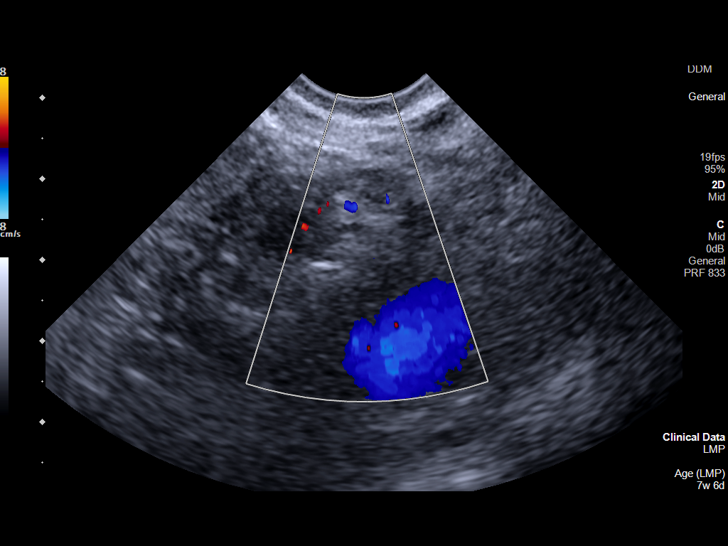

[14 of 28 positions shown; findings below may reference images not displayed]

FINDINGS: Intrauterine gestational sac: Single

Yolk sac:  Visualized.

Embryo:  Visualized.

Cardiac Activity: Visualized.

Heart Rate: 166 bpm

CRL: 16  Mm   8 w   0 d                  US EDC: 03/28/22

Subchorionic hemorrhage:  There is a small subchorionic hemorrhage.

Maternal uterus/adnexae: Right ovary is normal measuring 2.7 cm x
2.7 cm x 2.7 cm. The left ovary is normal measuring 2.3 cm x 1.7 cm
by 1.7 cm. There is no free fluid.
IMPRESSION: 1. Single live intrauterine pregnancy identified with an estimated
gestational age of 8 weeks 0 days by crown-rump length.
2. Small subchorionic hemorrhage.

## 2022-07-15 ENCOUNTER — Telehealth: Payer: Self-pay

## 2022-07-15 ENCOUNTER — Ambulatory Visit: Payer: Medicaid Other | Admitting: Gastroenterology

## 2022-07-15 NOTE — Telephone Encounter (Signed)
Called patient to let her know that we were cancelling her appointment for today since she did not do the EDG as recommended per Dr. Tobi Bastos. I let her know that she needed to call me back if she wanted to reschedule.

## 2022-11-10 ENCOUNTER — Other Ambulatory Visit (HOSPITAL_COMMUNITY)
Admission: RE | Admit: 2022-11-10 | Discharge: 2022-11-10 | Disposition: A | Discharge status: Home / Self Care | Payer: Medicaid Other | Source: Ambulatory Visit | Attending: Obstetrics and Gynecology | Admitting: Obstetrics and Gynecology

## 2022-11-10 ENCOUNTER — Ambulatory Visit (INDEPENDENT_AMBULATORY_CARE_PROVIDER_SITE_OTHER): Payer: Medicaid Other

## 2022-11-10 VITALS — BP 89/57 | HR 72 | Ht 62.0 in | Wt 123.0 lb

## 2022-11-10 DIAGNOSIS — N912 Amenorrhea, unspecified: Secondary | ICD-10-CM

## 2022-11-10 DIAGNOSIS — Z3201 Encounter for pregnancy test, result positive: Secondary | ICD-10-CM

## 2022-11-10 DIAGNOSIS — N898 Other specified noninflammatory disorders of vagina: Secondary | ICD-10-CM

## 2022-11-10 LAB — POCT URINE PREGNANCY: Preg Test, Ur: POSITIVE — AB

## 2022-11-10 NOTE — Progress Notes (Signed)
    NURSE VISIT NOTE  Subjective:    Patient ID: Allison Black, female    DOB: [birth year], 27 y.o.   MRN: [id number]  HPI  Patient is a 27 y.o. [id number] female who presents for evaluation of amenorrhea. She believes she could be pregnant. Pregnancy is desired. Sexual Activity: has sex with males. Current symptoms also include:  constipation, feeling tired, feeling bloated . Last period was normal. She is also having excessive unusual discharge with fishy odor and would like to be tested. Denies vaginal itching and irritation. Also denies vaginal spotting/bleeding.    Objective:    BP (!) 89/57   Pulse 72   Ht 5\' 2"  (1.575 m)   Wt 123 lb (55.8 kg)   LMP 10/10/2022 (Exact Date)   Breastfeeding No   BMI 22.50 kg/m   Lab Review  Results for orders placed or performed in visit on 11/10/22  POCT urine pregnancy  Result Value Ref Range   Preg Test, Ur Positive (A) Negative    Assessment:   1. Amenorrhea     Plan:   Pregnancy Test: Positive  Estimated Date of Delivery: 07/17/23 Encouraged well-balanced diet, plenty of rest when needed, pre-natal vitamins daily and walking for exercise.  Discussed self-help for nausea, avoiding OTC medications until consulting provider or pharmacist, other than Tylenol as needed, minimal caffeine (1-2 cups daily) and avoiding alcohol.   She will schedule her nurse visit @ 7-[redacted] wks pregnant, u/s for dating @10  wk, and NOB visit at [redacted] wk pregnant. Aptima sent to lab and will wait for results to be treated.    Feel free to call with any questions.    Donnetta Hail, CMA

## 2022-11-10 NOTE — Patient Instructions (Signed)
First Trimester of Pregnancy  The first trimester of pregnancy starts on the first day of your last menstrual period until the end of week 12. This is months 1 through 3 of pregnancy. A week after a sperm fertilizes an egg, the egg will implant into the wall of the uterus and begin to develop into a baby. By the end of 12 weeks, all the baby's organs will be formed and the baby will be 2-3 inches in size. Body changes during your first trimester Your body goes through many changes during pregnancy. The changes vary and generally return to normal after your baby is born. Physical changes You may gain or lose weight. Your breasts may begin to grow larger and become tender. The tissue that surrounds your nipples (areola) may become darker. Dark spots or blotches (chloasma or mask of pregnancy) may develop on your face. You may have changes in your hair. These can include thickening or thinning of your hair or changes in texture. Health changes You may feel nauseous, and you may vomit. You may have heartburn. You may develop headaches. You may develop constipation. Your gums may bleed and may be sensitive to brushing and flossing. Other changes You may tire easily. You may urinate more often. Your menstrual periods will stop. You may have a loss of appetite. You may develop cravings for certain kinds of food. You may have changes in your emotions from day to day. You may have more vivid and strange dreams. Follow these instructions at home: Medicines Follow your health care provider's instructions regarding medicine use. Specific medicines may be either safe or unsafe to take during pregnancy. Do not take any medicines unless told to by your health care provider. Take a prenatal vitamin that contains at least 600 micrograms (mcg) of folic acid. Eating and drinking Eat a healthy diet that includes fresh fruits and vegetables, whole grains, good sources of protein such as meat, eggs, or tofu,  and low-fat dairy products. Avoid raw meat and unpasteurized juice, milk, and cheese. These carry germs that can harm you and your baby. If you feel nauseous or you vomit: Eat 4 or 5 small meals a day instead of 3 large meals. Try eating a few soda crackers. Drink liquids between meals instead of during meals. You may need to take these actions to prevent or treat constipation: Drink enough fluid to keep your urine pale yellow. Eat foods that are high in fiber, such as beans, whole grains, and fresh fruits and vegetables. Limit foods that are high in fat and processed sugars, such as fried or sweet foods. Activity Exercise only as directed by your health care provider. Most people can continue their usual exercise routine during pregnancy. Try to exercise for 30 minutes at least 5 days a week. Stop exercising if you develop pain or cramping in the lower abdomen or lower back. Avoid exercising if it is very hot or humid or if you are at high altitude. Avoid heavy lifting. If you choose to, you may have sex unless your health care provider tells you not to. Relieving pain and discomfort Wear a good support bra to relieve breast tenderness. Rest with your legs elevated if you have leg cramps or low back pain. If you develop bulging veins (varicose veins) in your legs: Wear support hose as told by your health care provider. Elevate your feet for 15 minutes, 3-4 times a day. Limit salt in your diet. Safety Wear your seat belt at all times when   driving or riding in a car. Talk with your health care provider if someone is verbally or physically abusive to you. Talk with your health care provider if you are feeling sad or have thoughts of hurting yourself. Lifestyle Do not use hot tubs, steam rooms, or saunas. Do not douche. Do not use tampons or scented sanitary pads. Do not use herbal remedies, alcohol, illegal drugs, or medicines that are not approved by your health care provider. Chemicals  in these products can harm your baby. Do not use any products that contain nicotine or tobacco, such as cigarettes, e-cigarettes, and chewing tobacco. If you need help quitting, ask your health care provider. Avoid cat litter boxes and soil used by cats. These carry germs that can cause birth defects in the baby and possibly loss of the unborn baby (fetus) by miscarriage or stillbirth. General instructions During routine prenatal visits in the first trimester, your health care provider will do a physical exam, perform necessary tests, and ask you how things are going. Keep all follow-up visits. This is important. Ask for help if you have counseling or nutritional needs during pregnancy. Your health care provider can offer advice or refer you to specialists for help with various needs. Schedule a dentist appointment. At home, brush your teeth with a soft toothbrush. Floss gently. Write down your questions. Take them to your prenatal visits. Where to find more information American Pregnancy Association: americanpregnancy.org [employer]: https://www.todd-brady.net/ Office on [employer] Health: MightyReward[employer].nz Contact a health care provider if you have: Dizziness. A fever. Mild pelvic cramps, pelvic pressure, or nagging pain in the abdominal area. Nausea, vomiting, or diarrhea that lasts for 24 hours or longer. A bad-smelling vaginal discharge. Pain when you urinate. Known exposure to a contagious illness, such as chickenpox, measles, Zika virus, HIV, or hepatitis. Get help right away if you have: Spotting or bleeding from your vagina. Severe abdominal cramping or pain. Shortness of breath or chest pain. Any kind of trauma, such as from a fall or a car crash. New or increased pain, swelling, or redness in an arm or leg. Summary The first trimester of pregnancy starts on the first day of your last menstrual period until the end of week  12 (months 1 through 3). Eating 4 or 5 small meals a day rather than 3 large meals may help to relieve nausea and vomiting. Do not use any products that contain nicotine or tobacco, such as cigarettes, e-cigarettes, and chewing tobacco. If you need help quitting, ask your health care provider. Keep all follow-up visits. This is important. This information is not intended to replace advice given to you by your health care provider. Make sure you discuss any questions you have with your health care provider. Document Revised: 08/31/2019 Document Reviewed: 07/07/2019 Elsevier Patient Education  2024 Elsevier Inc.  Commonly Asked Questions During Pregnancy  Cats: A parasite can be excreted in cat feces.  To avoid exposure you need to have another person empty the little box.  If you must empty the litter box you will need to wear gloves.  Wash your hands after handling your cat.  This parasite can also be found in raw or undercooked meat so this should also be avoided.  Colds, Sore Throats, Flu: Please check your medication sheet to see what you can take for symptoms.  If your symptoms are unrelieved by these medications please call the office.  Dental Work: Most any dental work Agricultural consultant recommends is permitted.  X-rays should only be taken during the first trimester if absolutely necessary.  Your abdomen should be shielded with a lead apron during all x-rays.  Please notify your provider prior to receiving any x-rays.  Novocaine is fine; gas is not recommended.  If your dentist requires a note from [place] prior to dental work please call the office and we will provide one for you.  Exercise: Exercise is an important part of staying healthy during your pregnancy.  You may continue most exercises you were accustomed to prior to pregnancy.  Later in your pregnancy you will most likely notice you have difficulty with activities requiring balance like riding a bicycle.  It is important that you listen to  your body and avoid activities that put you at a higher risk of falling.  Adequate rest and staying well hydrated are a must!  If you have questions about the safety of specific activities ask your provider.    Exposure to Children with illness: Try to avoid obvious exposure; report any symptoms to [place] when noted,  If you have chicken pos, red measles or mumps, you should be immune to these diseases.   Please do not take any vaccines while pregnant unless you have checked with your OB provider.  Fetal Movement: After 28 weeks we recommend you do "kick counts" twice daily.  Lie or sit down in a calm quiet environment and count your baby movements "kicks".  You should feel your baby at least 10 times per hour.  If you have not felt 10 kicks within the first hour get up, walk around and have something sweet to eat or drink then repeat for an additional hour.  If count remains less than 10 per hour notify your provider.  Fumigating: Follow your pest control agent's advice as to how long to stay out of your home.  Ventilate the area well before re-entering.  Hemorrhoids:   Most over-the-counter preparations can be used during pregnancy.  Check your medication to see what is safe to use.  It is important to use a stool softener or fiber in your diet and to drink lots of liquids.  If hemorrhoids seem to be getting worse please call the office.   Hot Tubs:  Hot tubs Jacuzzis and saunas are not recommended while pregnant.  These increase your internal body temperature and should be avoided.  Intercourse:  Sexual intercourse is safe during pregnancy as long as you are comfortable, unless otherwise advised by your provider.  Spotting may occur after intercourse; report any bright red bleeding that is heavier than spotting.  Labor:  If you know that you are in labor, please go to the hospital.  If you are unsure, please call the office and let us help you decide what to do.  Lifting, straining, etc:  If your job  requires heavy lifting or straining please check with your provider for any limitations.  Generally, you should not lift items heavier than that you can lift simply with your hands and arms (no back muscles)  Painting:  Paint fumes do not harm your pregnancy, but may make you ill and should be avoided if possible.  Latex or water based paints have less odor than oils.  Use adequate ventilation while painting.  Permanents & Hair Color:  Chemicals in hair dyes are not recommended as they cause increase hair dryness which can increase hair loss during pregnancy.  " Highlighting" and permanents are allowed.  Dye may be absorbed differently and permanents  may not hold as well during pregnancy.  Sunbathing:  Use a sunscreen, as skin burns easily during pregnancy.  Drink plenty of fluids; avoid over heating.  Tanning Beds:  Because their possible side effects are still unknown, tanning beds are not recommended.  Ultrasound Scans:  Routine ultrasounds are performed at approximately 20 weeks.  You will be able to see your baby's general anatomy an if you would like to know the gender this can usually be determined as well.  If it is questionable when you conceived you may also receive an ultrasound early in your pregnancy for dating purposes.  Otherwise ultrasound exams are not routinely performed unless there is a medical necessity.  Although you can request a scan we ask that you pay for it when conducted because insurance does not cover " patient request" scans.  Work: If your pregnancy proceeds without complications you may work until your due date, unless your physician or employer advises otherwise.  Round Ligament Pain/Pelvic Discomfort:  Sharp, shooting pains not associated with bleeding are fairly common, usually occurring in the second trimester of pregnancy.  They tend to be worse when standing up or when you remain standing for long periods of time.  These are the result of pressure of certain  pelvic ligaments called "round ligaments".  Rest, Tylenol and heat seem to be the most effective relief.  As the womb and fetus grow, they rise out of the pelvis and the discomfort improves.  Please notify the office if your pain seems different than that described.  It may represent a more serious condition.  Common Medications Safe in Pregnancy  Acne:      Constipation:  Benzoyl Peroxide     Colace  Clindamycin      Dulcolax Suppository  Topica Erythromycin     Fibercon  Salicylic Acid      Metamucil         Miralax AVOID:        Senakot   Accutane    Cough:  Retin-A       Cough Drops  Tetracycline      Phenergan w/ Codeine if Rx  Minocycline      Robitussin (Plain & DM)  Antibiotics:     Crabs/Lice:  Ceclor       RID  Cephalosporins    AVOID:  E-Mycins      Kwell  Keflex  Macrobid/Macrodantin   Diarrhea:  Penicillin      Kao-Pectate  Zithromax      Imodium AD         PUSH FLUIDS AVOID:       Cipro     Fever:  Tetracycline      Tylenol (Regular or Extra  Minocycline       Strength)  Levaquin      Extra Strength-Do not          Exceed 8 tabs/24 hrs Caffeine:        200mg /day (equiv. To 1 cup of coffee or  approx. 3 12 oz sodas)         Gas: Cold/Hayfever:       Gas-X  Benadryl      Mylicon  Claritin       Phazyme  **Claritin-D        Chlor-Trimeton    Headaches:  Dimetapp      ASA-Free Excedrin  Drixoral-Non-Drowsy     Cold Compress  Mucinex (Guaifenasin)     Tylenol (Regular or Extra  Sudafed/Sudafed-12 Hour  Strength)  **Sudafed PE Pseudoephedrine   Tylenol Cold & Sinus     Vicks Vapor Rub  Zyrtec  **AVOID if Problems With Blood Pressure         Heartburn: Avoid lying down for at least 1 hour after meals  Aciphex      Maalox     Rash:  Milk of Magnesia     Benadryl    Mylanta       1% Hydrocortisone Cream  Pepcid  Pepcid Complete   Sleep Aids:  Prevacid      Ambien   Prilosec       Benadryl  Rolaids       Chamomile Tea  Tums (Limit  4/day)     Unisom         Tylenol PM         Warm milk-add vanilla or  Hemorrhoids:       Sugar for taste  Anusol/Anusol H.C.  (RX: Analapram 2.5%)  Sugar Substitutes:  Hydrocortisone OTC     Ok in moderation  Preparation H      Tucks        Vaseline lotion applied to tissue with wiping    Herpes:     Throat:  Acyclovir      Oragel  Famvir  Valtrex     Vaccines:         Flu Shot Leg Cramps:       *Gardasil  Benadryl      Hepatitis A         Hepatitis B Nasal Spray:       Pneumovax  Saline Nasal Spray     Polio Booster         Tetanus Nausea:       Tuberculosis test or PPD  Vitamin B6 25 mg TID   AVOID:    Dramamine      *Gardasil  Emetrol       Live Poliovirus  Ginger Root 250 mg QID    MMR (measles, mumps &  High Complex Carbs @ Bedtime    rebella)  Sea Bands-Accupressure    Varicella (Chickenpox)  Unisom 1/2 tab TID     *No known complications           If received before Pain:         Known pregnancy;   Darvocet       Resume series after  Lortab        Delivery  Percocet    Yeast:   Tramadol      Femstat  Tylenol 3      Gyne-lotrimin  Ultram       Monistat  Vicodin           MISC:         All Sunscreens           Hair Coloring/highlights          Insect Repellant's          (Including DEET)         Mystic Tans  

## 2022-11-11 ENCOUNTER — Other Ambulatory Visit: Payer: Self-pay | Admitting: Obstetrics and Gynecology

## 2022-11-11 MED ORDER — METRONIDAZOLE 500 MG PO TABS: 500.0000 mg | ORAL_TABLET | Freq: Two times a day (BID) | ORAL | 0 refills | Status: DC

## 2022-11-18 ENCOUNTER — Telehealth: Payer: Self-pay

## 2022-11-18 NOTE — Telephone Encounter (Signed)
TRIAGE VOICEMAIL: Patient requesting a rx for headache. She reports she has been taking tylenol, but it's not working for her. Her head has been pounding really bad. She woke up at 3am and was throwing up. She has tried more tylenol this am without relief. KG#[phone]

## 2022-11-18 NOTE — Telephone Encounter (Signed)
Spoke with patient. Advised of information below to try. Patient will contact us if no relief after trying these methods.  Headaches are very common during pregnancy and can result from many different things such as dehydration, not getting enough to eat, and pregnancy hormones. Sometimes headaches are a sign of more serious complications, so please let us know if your headaches are severe or do not improve with the suggestions below.   Stay well hydrated, drink enough water so that your urine is light yellow or clear   Eat small frequent meals and snacks   You can try Tylenol/acetaminophen (1-2 regular strength 325mg  or 1-2 extra strength 500mg ) as directed on the box. The least amount of medication that works is best.   Cool compresses (Immunologist or ice pack) to area of head that is hurting   You can also try drinking a caffeinated drink to see if this will help For Prevention of Frequent Headaches/Migraines:   CoQ10 100mg  three times daily   Vitamin B2 400mg  daily   Magnesium Oxide 400-600mg  daily

## 2022-11-24 ENCOUNTER — Ambulatory Visit: Payer: Medicaid Other

## 2022-11-24 VITALS — Wt 128.0 lb

## 2022-11-24 DIAGNOSIS — Z3689 Encounter for other specified antenatal screening: Secondary | ICD-10-CM

## 2022-11-24 DIAGNOSIS — Z369 Encounter for antenatal screening, unspecified: Secondary | ICD-10-CM

## 2022-11-24 DIAGNOSIS — Z3480 Encounter for supervision of other normal pregnancy, unspecified trimester: Secondary | ICD-10-CM | POA: Insufficient documentation

## 2022-11-24 NOTE — Progress Notes (Signed)
New OB Intake  I connected with  Allison Black on 11/24/22 at  3:15 PM EDT by telephone Video Visit and verified that I am speaking with the correct person using two identifiers. Nurse is located at [facility] and pt is located at home.  I explained I am completing New OB Intake today. We discussed her EDD of 07/17/2023 that is based on LMP of 10/10/2022. Pt is G5/P2022. I reviewed her allergies, medications, Medical/Surgical/OB history, and appropriate screenings. There are no cats in the home.  Based on history, this is a/an pregnancy uncomplicated .   Patient Active Problem List   Diagnosis Date Noted   Supervision of other normal pregnancy, antepartum 11/24/2022   Bipolar disease during pregnancy, antepartum (HCC) 08/15/2021   Candidal vaginitis 08/08/2021   Missed period 07/29/2021   High risk medication use 06/13/2021   Acute cystitis with hematuria 05/06/2021   Long term current use of cannabis 04/23/2021   At risk for prolonged QT interval syndrome 04/23/2021   Noncompliance with treatment plan 04/23/2021   Bipolar and related disorder (HCC) 03/27/2021   GAD (generalized anxiety disorder) 03/27/2021   Cannabis use disorder, moderate, dependence (HCC) 03/27/2021   Asthma 02/10/2017   Need for immunization against influenza 01/27/2017   Episodic lightheadedness 12/19/2016   Depression with anxiety 10/09/2016    Concerns addressed today Nausea; adv per new protocol. Sleep, is benadryl okay; adv the unisom will help with sleep; to use benadryl sparingly.  Delivery Plans:  Plans to deliver at [facility].  Anatomy US Explained first scheduled [place] will be Aug 20th and an anatomy scan will be done at 20 weeks.  Labs Discussed genetic screening with patient. Patient desires genetic testing to be drawn at new OB visit. Discussed possible labs to be drawn at new OB appointment.  COVID Vaccine Patient has had COVID vaccine.   Social Determinants of  Health Food Insecurity: denies food insecurity Transportation: Patient denies transportation needs. Childcare: Discussed no children allowed at ultrasound appointments.   First visit review I reviewed new OB appt with pt. I explained she will have ob bloodwork and pap smear/pelvic exam if indicated. Explained pt will be seen by an AOB provider at first visit; encounter routed to appropriate provider.   Loran Senters, [facility] 11/24/2022  3:55 PM

## 2022-11-24 NOTE — Patient Instructions (Signed)
First Trimester of Pregnancy  The first trimester of pregnancy starts on the first day of your last menstrual period until the end of week 12. This is also called months 1 through 3 of pregnancy. Body changes during your first trimester Your body goes through many changes during pregnancy. The changes usually return to normal after your baby is born. Physical changes You may gain or lose weight. Your breasts may grow larger and hurt. The area around your nipples may get darker. Dark spots or blotches may develop on your face. You may have changes in your hair. Health changes You may feel like you might vomit (nauseous), and you may vomit. You may have heartburn. You may have headaches. You may have trouble pooping (constipation). Your gums may bleed. Other changes You may get tired easily. You may pee (urinate) more often. Your menstrual periods will stop. You may not feel hungry. You may want to eat certain kinds of food. You may have changes in your emotions from day to day. You may have more dreams. Follow these instructions at home: Medicines Take over-the-counter and prescription medicines only as told by your doctor. Some medicines are not safe during pregnancy. Take a prenatal vitamin that contains at least 600 micrograms (mcg) of folic acid. Eating and drinking Eat healthy meals that include: Fresh fruits and vegetables. Whole grains. Good sources of protein, such as meat, eggs, or tofu. Low-fat dairy products. Avoid raw meat and unpasteurized juice, milk, and cheese. If you feel like you may vomit, or you vomit: Eat 4 or 5 small meals a day instead of 3 large meals. Try eating a few soda crackers. Drink liquids between meals instead of during meals. You may need to take these actions to prevent or treat trouble pooping: Drink enough fluids to keep your pee (urine) pale yellow. Eat foods that are high in fiber. These include beans, whole grains, and fresh fruits and  vegetables. Limit foods that are high in fat and sugar. These include fried or sweet foods. Activity Exercise only as told by your doctor. Most people can do their usual exercise routine during pregnancy. Stop exercising if you have cramps or pain in your lower belly (abdomen) or low back. Do not exercise if it is too hot or too humid, or if you are in a place of great height (high altitude). Avoid heavy lifting. If you choose to, you may have sex unless your doctor tells you not to. Relieving pain and discomfort Wear a good support bra if your breasts are sore. Rest with your legs raised (elevated) if you have leg cramps or low back pain. If you have bulging veins (varicose veins) in your legs: Wear support hose as told by your doctor. Raise your feet for 15 minutes, 3-4 times a day. Limit salt in your food. Safety Wear your seat belt at all times when you are in a car. Talk with your doctor if someone is hurting you or yelling at you. Talk with your doctor if you are feeling sad or have thoughts of hurting yourself. Lifestyle Do not use hot tubs, steam rooms, or saunas. Do not douche. Do not use tampons or scented sanitary pads. Do not use herbal medicines, illegal drugs, or medicines that are not approved by your doctor. Do not drink alcohol. Do not smoke or use any products that contain nicotine or tobacco. If you need help quitting, ask your doctor. Avoid cat litter boxes and soil that is used by cats. These carry   germs that can cause harm to the baby and can cause a loss of your baby by miscarriage or stillbirth. General instructions Keep all follow-up visits. This is important. Ask for help if you need counseling or if you need help with nutrition. Your doctor can give you advice or tell you where to go for help. Visit your dentist. At home, brush your teeth with a soft toothbrush. Floss gently. Write down your questions. Take them to your prenatal visits. Where to find more  information American Pregnancy Association: americanpregnancy.org American College of Obstetricians and Gynecologists: www.acog.org Office on Women's Health: womenshealth.gov/pregnancy Contact a doctor if: You are dizzy. You have a fever. You have mild cramps or pressure in your lower belly. You have a nagging pain in your belly area. You continue to feel like you may vomit, you vomit, or you have watery poop (diarrhea) for 24 hours or longer. You have a bad-smelling fluid coming from your vagina. You have pain when you pee. You are exposed to a disease that spreads from person to person, such as chickenpox, measles, Zika virus, HIV, or hepatitis. Get help right away if: You have spotting or bleeding from your vagina. You have very bad belly cramping or pain. You have shortness of breath or chest pain. You have any kind of injury, such as from a fall or a car crash. You have new or increased pain, swelling, or redness in an arm or leg. Summary The first trimester of pregnancy starts on the first day of your last menstrual period until the end of week 12 (months 1 through 3). Eat 4 or 5 small meals a day instead of 3 large meals. Do not smoke or use any products that contain nicotine or tobacco. If you need help quitting, ask your doctor. Keep all follow-up visits. This information is not intended to replace advice given to you by your health care provider. Make sure you discuss any questions you have with your health care provider. Document Revised: 08/31/2019 Document Reviewed: 07/07/2019 Elsevier Patient Education  2024 [employer]. Commonly Asked Questions During Pregnancy  Cats: A parasite can be excreted in cat feces.  To avoid exposure you need to have another person empty the little box.  If you must empty the litter box you will need to wear gloves.  Wash your hands after handling your cat.  This parasite can also be found in raw or undercooked meat so this should also be  avoided.  Colds, Sore Throats, Flu: Please check your medication sheet to see what you can take for symptoms.  If your symptoms are unrelieved by these medications please call the office.  Dental Work: Most any dental work your dentist recommends is permitted.  X-rays should only be taken during the first trimester if absolutely necessary.  Your abdomen should be shielded with a lead apron during all x-rays.  Please notify your provider prior to receiving any x-rays.  Novocaine is fine; gas is not recommended.  If your dentist requires a note from us prior to dental work please call the office and we will provide one for you.  Exercise: Exercise is an important part of staying healthy during your pregnancy.  You may continue most exercises you were accustomed to prior to pregnancy.  Later in your pregnancy you will most likely notice you have difficulty with activities requiring balance like riding a bicycle.  It is important that you listen to your body and avoid activities that put you at a higher   risk of falling.  Adequate rest and staying well hydrated are a must!  If you have questions about the safety of specific activities ask your provider.    Exposure to Children with illness: Try to avoid obvious exposure; report any symptoms to us when noted,  If you have chicken pos, red measles or mumps, you should be immune to these diseases.   Please do not take any vaccines while pregnant unless you have checked with your OB provider.  Fetal Movement: After 28 weeks we recommend you do "kick counts" twice daily.  Lie or sit down in a calm quiet environment and count your baby movements "kicks".  You should feel your baby at least 10 times per hour.  If you have not felt 10 kicks within the first hour get up, walk around and have something sweet to eat or drink then repeat for an additional hour.  If count remains less than 10 per hour notify your provider.  Fumigating: Follow your pest control agent's  advice as to how long to stay out of your home.  Ventilate the area well before re-entering.  Hemorrhoids:   Most over-the-counter preparations can be used during pregnancy.  Check your medication to see what is safe to use.  It is important to use a stool softener or fiber in your diet and to drink lots of liquids.  If hemorrhoids seem to be getting worse please call the office.   Hot Tubs:  Hot tubs Jacuzzis and saunas are not recommended while pregnant.  These increase your internal body temperature and should be avoided.  Intercourse:  Sexual intercourse is safe during pregnancy as long as you are comfortable, unless otherwise advised by your provider.  Spotting may occur after intercourse; report any bright red bleeding that is heavier than spotting.  Labor:  If you know that you are in labor, please go to the hospital.  If you are unsure, please call the office and let us help you decide what to do.  Lifting, straining, etc:  If your job requires heavy lifting or straining please check with your provider for any limitations.  Generally, you should not lift items heavier than that you can lift simply with your hands and arms (no back muscles)  Painting:  Paint fumes do not harm your pregnancy, but may make you ill and should be avoided if possible.  Latex or water based paints have less odor than oils.  Use adequate ventilation while painting.  Permanents & Hair Color:  Chemicals in hair dyes are not recommended as they cause increase hair dryness which can increase hair loss during pregnancy.  " Highlighting" and permanents are allowed.  Dye may be absorbed differently and permanents may not hold as well during pregnancy.  Sunbathing:  Use a sunscreen, as skin burns easily during pregnancy.  Drink plenty of fluids; avoid over heating.  Tanning Beds:  Because their possible side effects are still unknown, tanning beds are not recommended.  Ultrasound Scans:  Routine ultrasounds are performed  at approximately 20 weeks.  You will be able to see your baby's general anatomy an if you would like to know the gender this can usually be determined as well.  If it is questionable when you conceived you may also receive an ultrasound early in your pregnancy for dating purposes.  Otherwise ultrasound exams are not routinely performed unless there is a medical necessity.  Although you can request a scan we ask that you pay for it when   conducted because insurance does not cover " patient request" scans.  Work: If your pregnancy proceeds without complications you may work until your due date, unless your physician or employer advises otherwise.  Round Ligament Pain/Pelvic Discomfort:  Sharp, shooting pains not associated with bleeding are fairly common, usually occurring in the second trimester of pregnancy.  They tend to be worse when standing up or when you remain standing for long periods of time.  These are the result of pressure of certain pelvic ligaments called "round ligaments".  Rest, Tylenol and heat seem to be the most effective relief.  As the womb and fetus grow, they rise out of the pelvis and the discomfort improves.  Please notify the office if your pain seems different than that described.  It may represent a more serious condition.  Common Medications Safe in Pregnancy  Acne:      Constipation:  Benzoyl Peroxide     Colace  Clindamycin      Dulcolax Suppository  Topica Erythromycin     Fibercon  Salicylic Acid      Metamucil         Miralax AVOID:        Senakot   Accutane    Cough:  Retin-A       Cough Drops  Tetracycline      Phenergan w/ Codeine if Rx  Minocycline      Robitussin (Plain & DM)  Antibiotics:     Crabs/Lice:  Ceclor       RID  Cephalosporins    AVOID:  E-Mycins      Kwell  Keflex  Macrobid/Macrodantin   Diarrhea:  Penicillin      Kao-Pectate  Zithromax      Imodium AD         PUSH FLUIDS AVOID:       Cipro     Fever:  Tetracycline      Tylenol (Regular  or Extra  Minocycline       Strength)  Levaquin      Extra Strength-Do not          Exceed 8 tabs/24 hrs Caffeine:        <200mg/day (equiv. To 1 cup of coffee or  approx. 3 12 oz sodas)         Gas: Cold/Hayfever:       Gas-X  Benadryl      Mylicon  Claritin       Phazyme  **Claritin-D        Chlor-Trimeton    Headaches:  Dimetapp      ASA-Free Excedrin  Drixoral-Non-Drowsy     Cold Compress  Mucinex (Guaifenasin)     Tylenol (Regular or Extra  Sudafed/Sudafed-12 Hour     Strength)  **Sudafed PE Pseudoephedrine   Tylenol Cold & Sinus     Vicks Vapor Rub  Zyrtec  **AVOID if Problems With Blood Pressure         Heartburn: Avoid lying down for at least 1 hour after meals  Aciphex      Maalox     Rash:  Milk of Magnesia     Benadryl    Mylanta       1% Hydrocortisone Cream  Pepcid  Pepcid Complete   Sleep Aids:  Prevacid      Ambien   Prilosec       Benadryl  Rolaids       Chamomile Tea  Tums (Limit 4/day)     Unisom           Tylenol PM         Warm milk-add vanilla or  Hemorrhoids:       Sugar for taste  Anusol/Anusol H.C.  (RX: Analapram 2.5%)  Sugar Substitutes:  Hydrocortisone OTC     Ok in moderation  Preparation H      Tucks        Vaseline lotion applied to tissue with wiping    Herpes:     Throat:  Acyclovir      Oragel  Famvir  Valtrex     Vaccines:         Flu Shot Leg Cramps:       *Gardasil  Benadryl      Hepatitis A         Hepatitis B Nasal Spray:       Pneumovax  Saline Nasal Spray     Polio Booster         Tetanus Nausea:       Tuberculosis test or PPD  Vitamin B6 25 mg TID   AVOID:    Dramamine      *Gardasil  Emetrol       Live Poliovirus  Ginger Root 250 mg QID    MMR (measles, mumps &  High Complex Carbs @ Bedtime    rebella)  Sea Bands-Accupressure    Varicella (Chickenpox)  Unisom 1/2 tab TID     *No known complications           If received before Pain:         Known pregnancy;   Darvocet       Resume series  after  Lortab        Delivery  Percocet    Yeast:   Tramadol      Femstat  Tylenol 3      Gyne-lotrimin  Ultram       Monistat  Vicodin           MISC:         All Sunscreens           Hair Coloring/highlights          Insect Repellant's          (Including DEET)         Mystic Tans  

## 2022-11-25 ENCOUNTER — Ambulatory Visit (INDEPENDENT_AMBULATORY_CARE_PROVIDER_SITE_OTHER): Payer: Medicaid Other

## 2022-11-25 DIAGNOSIS — Z3A01 Less than 8 weeks gestation of pregnancy: Secondary | ICD-10-CM

## 2022-11-25 DIAGNOSIS — Z3687 Encounter for antenatal screening for uncertain dates: Secondary | ICD-10-CM | POA: Diagnosis not present

## 2022-11-25 DIAGNOSIS — Z369 Encounter for antenatal screening, unspecified: Secondary | ICD-10-CM

## 2022-11-25 DIAGNOSIS — Z3480 Encounter for supervision of other normal pregnancy, unspecified trimester: Secondary | ICD-10-CM

## 2022-11-26 ENCOUNTER — Encounter: Payer: Self-pay | Admitting: Licensed Practical Nurse

## 2022-11-26 ENCOUNTER — Other Ambulatory Visit: Payer: Self-pay

## 2022-11-26 DIAGNOSIS — O219 Vomiting of pregnancy, unspecified: Secondary | ICD-10-CM

## 2022-11-26 MED ORDER — DOXYLAMINE-PYRIDOXINE 10-10 MG PO TBEC
2.0000 | DELAYED_RELEASE_TABLET | Freq: Every day | ORAL | 5 refills | Status: DC
Start: 2022-11-26 — End: 2023-03-10

## 2022-11-26 NOTE — Telephone Encounter (Signed)
Is that medicaid?

## 2022-11-27 NOTE — Telephone Encounter (Signed)
Requested verification of eligibility from front desk staff.

## 2022-11-27 NOTE — Telephone Encounter (Signed)
Active as of 11/25/22 thruEPIC

## 2022-12-10 ENCOUNTER — Other Ambulatory Visit: Payer: Self-pay

## 2022-12-10 DIAGNOSIS — O219 Vomiting of pregnancy, unspecified: Secondary | ICD-10-CM

## 2022-12-10 MED ORDER — PROMETHAZINE HCL 12.5 MG PO TABS
12.5000 mg | ORAL_TABLET | Freq: Four times a day (QID) | ORAL | 0 refills | Status: AC | PRN
Start: 2022-12-10 — End: ?

## 2022-12-30 ENCOUNTER — Ambulatory Visit
Admission: EM | Admit: 2022-12-30 | Discharge: 2022-12-30 | Disposition: A | Discharge status: Home / Self Care | Payer: Medicaid Other | Attending: Physician Assistant | Admitting: Physician Assistant

## 2022-12-30 DIAGNOSIS — J45901 Unspecified asthma with (acute) exacerbation: Secondary | ICD-10-CM | POA: Diagnosis not present

## 2022-12-30 DIAGNOSIS — Z3A12 12 weeks gestation of pregnancy: Secondary | ICD-10-CM | POA: Diagnosis not present

## 2022-12-30 DIAGNOSIS — R0602 Shortness of breath: Secondary | ICD-10-CM

## 2022-12-30 MED ORDER — ALBUTEROL SULFATE HFA 108 (90 BASE) MCG/ACT IN AERS: 1.0000 | INHALATION_SPRAY | Freq: Four times a day (QID) | RESPIRATORY_TRACT | 1 refills | Status: AC | PRN

## 2022-12-30 NOTE — Discharge Instructions (Addendum)
-  Use inhaler as needed and follow up with PCP. -If inhaler is not working and shortness of breath worsens, go to ER.

## 2022-12-30 NOTE — ED Triage Notes (Signed)
Pt presents to UC c/o SOB onset x2 weeks ago, pt reports hx of asthma, pt states she is about [redacted] weeks pregnant.

## 2022-12-30 NOTE — ED Provider Notes (Signed)
[facility]    CSN: [id number] Arrival date & time: 12/30/22  1141      History   Chief Complaint No chief complaint on file.   HPI Allison Black is a 27 y.o. female presenting for approximately 2-week history of intermittent shortness of breath and chest tightness.  She reports a history of asthma and that she does not currently have an inhaler.  She set up to see a new PCP in 2 months but request an inhaler.  She reports compliance with exertion she will have a chest tightness that she experienced at this morning.  She has not been ill and denies any chest pain, fever, cough.  States she has used a daily inhaler in the past but not for a while.  Requesting albuterol inhaler.  HPI  Past Medical History:  Diagnosis Date   Asthma    Bipolar disorder (HCC)    Family history of ovarian cancer    5/23 cancer genetic testing letter sent   Heart murmur    Panic attack     Patient Active Problem List   Diagnosis Date Noted   Supervision of other normal pregnancy, antepartum 11/24/2022   Bipolar disease during pregnancy, antepartum (HCC) 08/15/2021   Candidal vaginitis 08/08/2021   Missed period 07/29/2021   High risk medication use 06/13/2021   Acute cystitis with hematuria 05/06/2021   Long term current use of cannabis 04/23/2021   At risk for prolonged QT interval syndrome 04/23/2021   Noncompliance with treatment plan 04/23/2021   Bipolar and related disorder (HCC) 03/27/2021   GAD (generalized anxiety disorder) 03/27/2021   Cannabis use disorder, moderate, dependence (HCC) 03/27/2021   Asthma 02/10/2017   Need for immunization against influenza 01/27/2017   Episodic lightheadedness 12/19/2016   Depression with anxiety 10/09/2016    Past Surgical History:  Procedure Laterality Date   NO PAST SURGERIES      OB History     Gravida  5   Para  2   Term  2   Preterm      AB  2   Living  2      SAB  2   IAB      Ectopic      Multiple  0    Live Births  2            Home Medications    Prior to Admission medications   Medication Sig Start Date End Date Taking? Authorizing Provider  albuterol (VENTOLIN HFA) 108 (90 Base) MCG/ACT inhaler Inhale 1-2 puffs into the lungs every 6 (six) hours as needed for wheezing or shortness of breath. 12/30/22  Yes Shirlee Latch, PA-C  diphenhydrAMINE (BENADRYL) 25 mg capsule Take 25 mg by mouth as needed.   Yes [provider]  metroNIDAZOLE (FLAGYL) 500 MG tablet Take 1 tablet (500 mg total) by mouth 2 (two) times daily. Patient not taking: Reported on 11/24/2022 11/11/22   Hildred Laser, MD  Prenatal Vit-Fe Fumarate-FA (PRENATAL PO) Take by mouth.   Yes [provider]  promethazine (PHENERGAN) 12.5 MG tablet Take 1 tablet (12.5 mg total) by mouth every 6 (six) hours as needed for nausea or vomiting. 12/10/22  Yes Dominica Severin, CNM  acetaminophen (TYLENOL) 160 MG chewable tablet Chew 160 mg by mouth as needed for pain.    [provider]  Doxylamine-Pyridoxine (DICLEGIS) 10-10 MG TBEC Take 2 tablets by mouth at bedtime. If symptoms persist, add one tablet in the morning and  one in the afternoon 11/26/22   Dominic, Courtney Heys, CNM    Family History Family History  Problem Relation Age of Onset   Bipolar disorder Mother    Lupus Mother    Healthy Father    Mental illness Sister    Asperger's syndrome Sister    Depression Sister    ADD / ADHD Sister    Healthy Brother    Autism Brother    Anxiety disorder Brother    Diabetes Maternal Grandmother    Healthy Maternal Grandfather    Parkinson's disease Paternal Grandmother    Heart failure Paternal Grandmother    Heart Problems Paternal Grandfather    Bipolar disorder Maternal Aunt    Ovarian cancer Maternal Aunt    Bipolar disorder Maternal Aunt    Bipolar disorder Maternal Aunt    Schizophrenia Maternal Aunt    Suicidality Maternal Aunt    Diabetes Cousin     Social History Social History    Tobacco Use   Smoking status: Former    Types: Cigarettes   Smokeless tobacco: Never  Vaping Use   Vaping status: Former  Substance Use Topics   Alcohol use: Not Currently    Alcohol/week: 1.0 standard drink of alcohol    Types: 1 Shots of liquor per week    Comment: 1-2 shots a few times a month   Drug use: Not Currently    Frequency: 28.0 times per week    Types: Marijuana    Comment: stoped with 2024 preg     Allergies   Patient has no known allergies.   Review of Systems Review of Systems  Constitutional:  Negative for chills, diaphoresis, fatigue and fever.  HENT:  Negative for congestion, ear pain, rhinorrhea, sinus pressure, sinus pain and sore throat.   Respiratory:  Positive for chest tightness and shortness of breath. Negative for cough.   Cardiovascular:  Negative for chest pain.  Gastrointestinal:  Negative for abdominal pain, nausea and vomiting.  Musculoskeletal:  Negative for arthralgias and myalgias.  Skin:  Negative for rash.  Neurological:  Negative for weakness and headaches.  Hematological:  Negative for adenopathy.     Physical Exam Triage Vital Signs ED Triage Vitals  Encounter Vitals Group     BP 12/30/22 1153 104/67     Systolic BP Percentile --      Diastolic BP Percentile --      Pulse Rate 12/30/22 1153 60     Resp --      Temp 12/30/22 1153 98.1 F (36.7 C)     Temp Source 12/30/22 1153 Oral     SpO2 12/30/22 1153 99 %     Weight --      Height --      Head Circumference --      Peak Flow --      Pain Score 12/30/22 1152 0     Pain Loc --      Pain Education --      Exclude from Growth Chart --    No data found.  Updated Vital Signs BP 104/67 (BP Location: Right Arm)   Pulse 60   Temp 98.1 F (36.7 C) (Oral)   LMP 10/10/2022 (Exact Date)   SpO2 99%   Physical Exam Vitals and nursing note reviewed.  Constitutional:      General: She is not in acute distress.    Appearance: Normal appearance. She is not  ill-appearing or toxic-appearing.  HENT:     Head: Normocephalic  and atraumatic.     Nose: Nose normal.     Mouth/Throat:     Mouth: Mucous membranes are moist.     Pharynx: Oropharynx is clear.  Eyes:     General: No scleral icterus.       Right eye: No discharge.        Left eye: No discharge.     Conjunctiva/sclera: Conjunctivae normal.  Cardiovascular:     Rate and Rhythm: Normal rate and regular rhythm.     Heart sounds: Normal heart sounds.  Pulmonary:     Effort: Pulmonary effort is normal. No respiratory distress.     Breath sounds: Normal breath sounds.  Musculoskeletal:     Cervical back: Neck supple.  Skin:    General: Skin is dry.  Neurological:     General: No focal deficit present.     Mental Status: She is alert. Mental status is at baseline.     Motor: No weakness.     Gait: Gait normal.  Psychiatric:        Mood and Affect: Mood normal.        Behavior: Behavior normal.        Thought Content: Thought content normal.      UC Treatments / Results  Labs (all labs ordered are listed, but only abnormal results are displayed) Labs Reviewed - No data to display  EKG   Radiology No results found.  Procedures Procedures (including critical care time)  Medications Ordered in UC Medications - No data to display  Initial Impression / Assessment and Plan / UC Course  I have reviewed the triage vital signs and the nursing notes.  Pertinent labs & imaging results that were available during my care of the patient were reviewed by me and considered in my medical decision making (see chart for details).   27 year old female who is currently [redacted] weeks pregnant presents for chest tightness and shortness of breath intermittently over the past couple of weeks.  Has history of asthma and is out of her albuterol inhaler and cannot see PCP for another 2 months.  Vitals are all normal and stable and the patient is overall well-appearing.  Chest is clear at this time.   Slightly reduced air movement.  Mild asthma exacerbation.  Refilled albuterol inhaler.  Discussed supportive care and avoiding triggers.  Reviewed return and ER precautions.   Final Clinical Impressions(s) / UC Diagnoses   Final diagnoses:  Asthma with acute exacerbation, unspecified asthma severity, unspecified whether persistent  Shortness of breath  [redacted] weeks gestation of pregnancy     Discharge Instructions      -Use inhaler as needed and follow up with PCP. -If inhaler is not working and shortness of breath worsens, go to ER.     ED Prescriptions     Medication Sig Dispense Auth. Provider   albuterol (VENTOLIN HFA) 108 (90 Base) MCG/ACT inhaler Inhale 1-2 puffs into the lungs every 6 (six) hours as needed for wheezing or shortness of breath. 1 g Shirlee Latch, PA-C      PDMP not reviewed this encounter.   Shirlee Latch, PA-C 12/30/22 1233

## 2023-01-06 ENCOUNTER — Ambulatory Visit (INDEPENDENT_AMBULATORY_CARE_PROVIDER_SITE_OTHER): Payer: Medicaid Other | Admitting: Licensed Practical Nurse

## 2023-01-06 ENCOUNTER — Other Ambulatory Visit (HOSPITAL_COMMUNITY)
Admission: RE | Admit: 2023-01-06 | Discharge: 2023-01-06 | Disposition: A | Discharge status: Home / Self Care | Payer: Medicaid Other | Source: Ambulatory Visit | Attending: Licensed Practical Nurse | Admitting: Licensed Practical Nurse

## 2023-01-06 VITALS — BP 104/70 | HR 74 | Wt 131.9 lb

## 2023-01-06 DIAGNOSIS — Z3481 Encounter for supervision of other normal pregnancy, first trimester: Secondary | ICD-10-CM | POA: Diagnosis not present

## 2023-01-06 DIAGNOSIS — Z0283 Encounter for blood-alcohol and blood-drug test: Secondary | ICD-10-CM

## 2023-01-06 DIAGNOSIS — Z3480 Encounter for supervision of other normal pregnancy, unspecified trimester: Secondary | ICD-10-CM

## 2023-01-06 DIAGNOSIS — Z113 Encounter for screening for infections with a predominantly sexual mode of transmission: Secondary | ICD-10-CM

## 2023-01-06 DIAGNOSIS — O219 Vomiting of pregnancy, unspecified: Secondary | ICD-10-CM

## 2023-01-06 DIAGNOSIS — Z1379 Encounter for other screening for genetic and chromosomal anomalies: Secondary | ICD-10-CM | POA: Diagnosis not present

## 2023-01-06 DIAGNOSIS — Z3A12 12 weeks gestation of pregnancy: Secondary | ICD-10-CM

## 2023-01-07 LAB — CERVICOVAGINAL ANCILLARY ONLY
Chlamydia: NEGATIVE
Comment: NEGATIVE
Comment: NORMAL
Neisseria Gonorrhea: NEGATIVE

## 2023-01-07 MED ORDER — PROMETHAZINE HCL 12.5 MG PO TABS
12.5000 mg | ORAL_TABLET | Freq: Four times a day (QID) | ORAL | 0 refills | Status: DC | PRN
Start: 2023-01-07 — End: 2023-02-18
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
## 2023-01-08 LAB — URINE CULTURE

## 2023-01-09 LAB — URINE DRUG PANEL 7
Amphetamines, Urine: NEGATIVE ng/mL
Barbiturate Quant, Ur: NEGATIVE ng/mL
Benzodiazepine Quant, Ur: NEGATIVE ng/mL
Cannabinoid Quant, Ur: POSITIVE — AB
Cocaine (Metab.): NEGATIVE ng/mL
Opiate Quant, Ur: NEGATIVE ng/mL
PCP Quant, Ur: NEGATIVE ng/mL

## 2023-01-09 LAB — NICOTINE SCREEN, URINE: Cotinine Ql Scrn, Ur: NEGATIVE ng/mL

## 2023-01-12 LAB — RPR+RH+ABO+RUB AB+AB SCR+CB...
Antibody Screen: NEGATIVE
HIV Screen 4th Generation wRfx: NONREACTIVE
Hematocrit: 38.4 % (ref 34.0–46.6)
Hemoglobin: 12.6 g/dL (ref 11.1–15.9)
Hepatitis B Surface Ag: NEGATIVE
MCH: 30 pg (ref 26.6–33.0)
MCHC: 32.8 g/dL (ref 31.5–35.7)
MCV: 91 fL (ref 79–97)
Platelets: 229 10*3/uL (ref 150–450)
RBC: 4.2 x10E6/uL (ref 3.77–5.28)
RDW: 13 % (ref 11.7–15.4)
RPR Ser Ql: NONREACTIVE
Rh Factor: POSITIVE
Rubella Antibodies, IGG: 1.92 {index} (ref >0.99)
Varicella zoster IgG: REACTIVE
WBC: 9.5 10*3/uL (ref 3.4–10.8)

## 2023-01-12 LAB — HGB FRACTIONATION CASCADE
Hgb A2: 2.7 % (ref 1.8–3.2)
Hgb A: 97.3 % (ref 96.4–98.8)
Hgb F: 0 % (ref 0.0–2.0)
Hgb S: 0 %

## 2023-01-14 ENCOUNTER — Encounter: Payer: Medicaid Other | Admitting: Certified Nurse Midwife

## 2023-01-16 LAB — MATERNIT 21 PLUS CORE, BLOOD
Fetal Fraction: 17
Result (T21): NEGATIVE
Trisomy 13 (Patau syndrome): NEGATIVE
Trisomy 18 (Edwards syndrome): NEGATIVE
Trisomy 21 (Down syndrome): NEGATIVE

## 2023-02-03 ENCOUNTER — Ambulatory Visit (INDEPENDENT_AMBULATORY_CARE_PROVIDER_SITE_OTHER): Payer: Medicaid Other | Admitting: Certified Nurse Midwife

## 2023-02-03 VITALS — BP 118/70 | HR 70 | Wt 142.8 lb

## 2023-02-03 DIAGNOSIS — Z3A16 16 weeks gestation of pregnancy: Secondary | ICD-10-CM

## 2023-02-03 DIAGNOSIS — H5213 Myopia, bilateral: Secondary | ICD-10-CM | POA: Diagnosis not present

## 2023-02-03 DIAGNOSIS — H52223 Regular astigmatism, bilateral: Secondary | ICD-10-CM | POA: Diagnosis not present

## 2023-02-03 LAB — POCT URINALYSIS DIPSTICK OB
Bilirubin, UA: NEGATIVE
Blood, UA: NEGATIVE
Glucose, UA: NEGATIVE
Ketones, UA: NEGATIVE
Leukocytes, UA: NEGATIVE
Nitrite, UA: NEGATIVE
POC,PROTEIN,UA: NEGATIVE
Spec Grav, UA: <=1.005 — AB (ref 1.010–1.025)
Urobilinogen, UA: 0.2 U/dL
pH, UA: 6.5 (ref 5.0–8.0)

## 2023-02-03 NOTE — Addendum Note (Signed)
Addended by: Fonda Kinder on: 02/03/2023 09:35 AM   Modules accepted: Orders

## 2023-02-03 NOTE — Progress Notes (Signed)
ROB , doing well. Pt state she is not sure about movement yet. She sometimes thinks she is feeling movement. Reassurance given. Discussed anatomy u/s in 4 weeks and possibility of anterior placenta. She verbalizes understanding. Discussed round ligament pain and self help measures. She has order for anatomy u/s . Pt to follow up in 4 wks .   Doreene Burke, CNM

## 2023-02-14 ENCOUNTER — Encounter: Payer: Self-pay | Admitting: Certified Nurse Midwife

## 2023-02-14 ENCOUNTER — Emergency Department: Payer: Medicaid Other

## 2023-02-14 ENCOUNTER — Other Ambulatory Visit: Payer: Self-pay

## 2023-02-14 ENCOUNTER — Emergency Department
Admission: EM | Admit: 2023-02-14 | Discharge: 2023-02-14 | Disposition: A | Discharge status: Home / Self Care | Payer: Medicaid Other | Attending: Emergency Medicine | Admitting: Emergency Medicine

## 2023-02-14 DIAGNOSIS — N898 Other specified noninflammatory disorders of vagina: Secondary | ICD-10-CM

## 2023-02-14 DIAGNOSIS — O23592 Infection of other part of genital tract in pregnancy, second trimester: Secondary | ICD-10-CM | POA: Diagnosis not present

## 2023-02-14 DIAGNOSIS — Z3A18 18 weeks gestation of pregnancy: Secondary | ICD-10-CM | POA: Insufficient documentation

## 2023-02-14 LAB — CBC
HCT: 36 % (ref 36.0–46.0)
Hemoglobin: 11.8 g/dL — ABNORMAL LOW (ref 12.0–15.0)
MCH: 29.9 pg (ref 26.0–34.0)
MCHC: 32.8 g/dL (ref 30.0–36.0)
MCV: 91.1 fL (ref 80.0–100.0)
Platelets: 269 10*3/uL (ref 150–400)
RBC: 3.95 MIL/uL (ref 3.87–5.11)
RDW: 13.5 % (ref 11.5–15.5)
WBC: 9.8 10*3/uL (ref 4.0–10.5)
nRBC: 0 % (ref 0.0–0.2)

## 2023-02-14 LAB — URINALYSIS, ROUTINE W REFLEX MICROSCOPIC
Bilirubin Urine: NEGATIVE
Glucose, UA: NEGATIVE mg/dL
Hgb urine dipstick: NEGATIVE
Ketones, ur: NEGATIVE mg/dL
Leukocytes,Ua: NEGATIVE
Nitrite: NEGATIVE
Protein, ur: NEGATIVE mg/dL
Specific Gravity, Urine: 1.025 (ref 1.005–1.030)
pH: 6 (ref 5.0–8.0)

## 2023-02-14 LAB — COMPREHENSIVE METABOLIC PANEL
ALT: 18 U/L (ref 0–44)
AST: 20 U/L (ref 15–41)
Albumin: 3.5 g/dL (ref 3.5–5.0)
Alkaline Phosphatase: 68 U/L (ref 38–126)
Anion gap: 8 (ref 5–15)
BUN: 16 mg/dL (ref 6–20)
CO2: 24 mmol/L (ref 22–32)
Calcium: 8.8 mg/dL — ABNORMAL LOW (ref 8.9–10.3)
Chloride: 103 mmol/L (ref 98–111)
Creatinine, Ser: 0.47 mg/dL (ref 0.44–1.00)
GFR, Estimated: >60 mL/min (ref >60)
Glucose, Bld: 88 mg/dL (ref 70–99)
Potassium: 4 mmol/L (ref 3.5–5.1)
Sodium: 135 mmol/L (ref 135–145)
Total Bilirubin: 0.4 mg/dL (ref <1.2)
Total Protein: 7.4 g/dL (ref 6.5–8.1)

## 2023-02-14 NOTE — ED Notes (Signed)
US at bedside

## 2023-02-14 NOTE — ED Triage Notes (Signed)
First nurse note: Pt to ED via POV from home. Pt reports [redacted]wks pregnant w/ leaking of fluid/mucous. This is pt's 5th pregnancy with 2 living children. RN called OB floor and stating pt needed to be evaluated in ED first.   Dr. Anner Crete consulted for orders during triage.

## 2023-02-14 NOTE — ED Triage Notes (Signed)
Pt to ED at 18wks G5P2A2L2 for "mucous" from vagina today. No blood. No ctx. States mucous was yellowish. No hx preterm labor. Having braxton hicks. Denies urinary symptoms. Spoke with EDP Wells, will get basic bloodwork and urine.

## 2023-02-14 NOTE — ED Provider Notes (Signed)
[facility] Provider Note   Event Date/Time   First MD Initiated Contact with Patient 02/14/23 2124     (approximate) History  Vaginal Discharge (mucous)  HPI Allison Black is a 27 y.o. female currently [redacted] weeks pregnant G5, P2 A2 L2 who presents complaining of "line of mucus" that came from her vagina today.  Patient denies any blood.  Patient denies any persistent fluid leakage.  Patient denies any contractions.  Patient states that she is having mild bilateral cramping that is normal for previous pregnancies ROS: Patient currently denies any vision changes, tinnitus, difficulty speaking, facial droop, sore throat, chest pain, shortness of breath,  nausea/vomiting/diarrhea, dysuria, or weakness/numbness/paresthesias in any extremity   Physical Exam  Triage Vital Signs: ED Triage Vitals  Encounter Vitals Group     BP 02/14/23 1825 107/61     Systolic BP Percentile --      Diastolic BP Percentile --      Pulse Rate 02/14/23 1825 84     Resp 02/14/23 1825 18     Temp 02/14/23 1825 98.1 F (36.7 C)     Temp Source 02/14/23 1825 Oral     SpO2 02/14/23 1825 99 %     Weight 02/14/23 1826 144 lb (65.3 kg)     Height 02/14/23 1826 5\' 2"  (1.575 m)     Head Circumference --      Peak Flow --      Pain Score 02/14/23 1824 0     Pain Loc --      Pain Education --      Exclude from Growth Chart --    Most recent vital signs: Vitals:   02/14/23 1825 02/14/23 2319  BP: 107/61 110/81  Pulse: 84 90  Resp: 18 14  Temp: 98.1 F (36.7 C) 98 F (36.7 C)  SpO2: 99% 98%   General: Awake, oriented x4. CV:  Good peripheral perfusion.  Resp:  Normal effort.  Abd:  No distention.  Other:  Middle-aged overweight Caucasian female resting comfortably in no acute distress ED Results / Procedures / Treatments  Labs (all labs ordered are listed, but only abnormal results are displayed) Labs Reviewed  CBC - Abnormal; Notable for the following components:      Result  Value   Hemoglobin 11.8 (*)    All other components within normal limits  COMPREHENSIVE METABOLIC PANEL - Abnormal; Notable for the following components:   Calcium 8.8 (*)    All other components within normal limits  URINALYSIS, ROUTINE W REFLEX MICROSCOPIC - Abnormal; Notable for the following components:   Color, Urine YELLOW (*)    APPearance HAZY (*)    All other components within normal limits  RADIOLOGY ED MD interpretation: OB ultrasound interpreted independently shows a single live intrauterine gestation with no unexpected findings -Agree with radiology assessment Official radiology report(s): US OB Limited > 14 wks  Result Date: 02/14/2023 CLINICAL DATA:  Abnormal vaginal discharge. Gestational age by LMP 18 weeks 1 day EXAM: LIMITED OBSTETRIC ULTRASOUND COMPARISON:  Ultrasound 11/25/2022 FINDINGS: Number of Fetuses: 1 Heart Rate:  148 bpm Movement: Yes Presentation: Breech Placental Location: Anterior Previa: No Amniotic Fluid (Subjective):  Within normal limits. AFI: 6.1 cm BPD: 40.6 cm 18 w  2 d MATERNAL FINDINGS: Cervix:  Appears closed.  3.9 cm. Uterus/Adnexae: No abnormality visualized. IMPRESSION: Single living intrauterine gestation.  No unexpected findings. This exam is performed on an emergent basis and does not comprehensively evaluate fetal size, dating, or anatomy;  follow-up complete OB US should be considered if further fetal assessment is warranted. Please select correct "[place] Early OB" or "[place] ED OB Limited" template depending on combination of exams performed/being read (e.g. Both, Doppler, etc.) Electronically Signed   By: Minerva Fester M.D.   On: 02/14/2023 22:50   PROCEDURES: Critical Care performed: No .1-3 Lead EKG Interpretation  Performed by: Merwyn Katos, MD Authorized by: Merwyn Katos, MD     Interpretation: normal     ECG rate:  71   ECG rate assessment: normal     Rhythm: sinus rhythm     Ectopy: none     Conduction: normal    MEDICATIONS  ORDERED IN ED: Medications - No data to display IMPRESSION / MDM / ASSESSMENT AND PLAN / ED COURSE  I reviewed the triage vital signs and the nursing notes.                             The patient is on the cardiac monitor to evaluate for evidence of arrhythmia and/or significant heart rate changes. Patient's presentation is most consistent with acute presentation with potential threat to life or bodily function. Workup: CBC, BMP, UA, bHCG, OB Ultrasound  Based on History, Exam, and ED Workup patient's presentation not consistent with ectopic pregnancy, molar pregnancy, life-threatening coagulopathy, trauma, serious bacterial infection, central process or other emergency. I spoke to the on-call contact for OB/GYN who agrees with plan for discharge home if OB ultrasound shows no evidence of acute abnormalities.  Ultrasound shows single live intrauterine pregnancy without any unexpected findings Disposition: Will discharge home with return precautions and instruction for prompt OBGYN follow up.   FINAL CLINICAL IMPRESSION(S) / ED DIAGNOSES   Final diagnoses:  Vaginal discharge   Rx / DC Orders   ED Discharge Orders     None      Note:  This document was prepared using Dragon voice recognition software and may include unintentional dictation errors.   Merwyn Katos, MD 02/14/23 [phone]

## 2023-02-15 ENCOUNTER — Encounter: Payer: Self-pay | Admitting: Licensed Practical Nurse

## 2023-02-16 NOTE — Telephone Encounter (Signed)
There is another message regarding this.  

## 2023-02-17 ENCOUNTER — Ambulatory Visit: Payer: Medicaid Other | Admitting: Family Medicine

## 2023-02-17 DIAGNOSIS — H5213 Myopia, bilateral: Secondary | ICD-10-CM | POA: Diagnosis not present

## 2023-02-17 NOTE — Progress Notes (Deleted)
New patient visit   Patient: Allison Black   DOB: 05-26-1995   27 y.o. Female  MRN: [id number] Visit Date: 02/17/2023  Today's healthcare provider: Sherlyn Hay, DO   No chief complaint on file.  Subjective    Allison Black is a 27 y.o. female who presents today as a new patient to establish care.  HPI  Currently pregnant: [redacted]w[redacted]d; EDD 07/17/23 Rec PO iron  .[email or web address]  ***  Past Medical History:  Diagnosis Date   Asthma    Bipolar disorder (HCC)    Family history of ovarian cancer    5/23 cancer genetic testing letter sent   Heart murmur    Panic attack    Past Surgical History:  Procedure Laterality Date   NO PAST SURGERIES     Family Status  Relation Name Status   Mother  Alive   Father  Alive   Sister  Alive   Sister  Alive   Sister  Alive   Sister not sure Alive   Brother  Alive   Brother  Alive   MGM  Alive   MGF  Alive   PGM  Alive   PGF  Alive   Mat Aunt  Alive   Mat Aunt  Alive   Mat Aunt  Alive   Mat Aunt  Deceased   Cousin not sure of all dsxs Alive  No partnership data on file   Family History  Problem Relation Age of Onset   Bipolar disorder Mother    Lupus Mother    Healthy Father    Mental illness Sister    Asperger's syndrome Sister    Depression Sister    ADD / ADHD Sister    Healthy Brother    Autism Brother    Anxiety disorder Brother    Diabetes Maternal Grandmother    Healthy Maternal Grandfather    Parkinson's disease Paternal Grandmother    Heart failure Paternal Grandmother    Heart Problems Paternal Grandfather    Bipolar disorder Maternal Aunt    Ovarian cancer Maternal Aunt    Bipolar disorder Maternal Aunt    Bipolar disorder Maternal Aunt    Schizophrenia Maternal Aunt    Suicidality Maternal Aunt    Diabetes Cousin    Social History   Socioeconomic History   Marital status: Married    Spouse name: [name]   Number of children: 2   Years of education: 12   Highest education level: Not on  file  Occupational History   Occupation: Scientist, [employer]  Tobacco Use   Smoking status: Former    Types: Cigarettes   Smokeless tobacco: Never  Vaping Use   Vaping status: Former  Substance and Sexual Activity   Alcohol use: Not Currently    Alcohol/week: 1.0 standard drink of alcohol    Types: 1 Shots of liquor per week    Comment: 1-2 shots a few times a month   Drug use: Not Currently    Frequency: 28.0 times per week    Types: Marijuana    Comment: stoped with 2024 preg   Sexual activity: Yes    Partners: Male    Birth control/protection: None  Other Topics Concern   Not on file  Social History Narrative   Son - [name]    Daughter - [name]   Social Determinants of Health   Financial Resource Strain: Low Risk  (11/24/2022)   Overall Financial Resource Strain (CARDIA)    Difficulty of Paying  Living Expenses: Not hard at all  Food Insecurity: No Food Insecurity (11/24/2022)   Hunger Vital Sign    Worried About Running Out of Food in the Last Year: Never true    Ran Out of Food in the Last Year: Never true  Transportation Needs: No Transportation Needs (11/24/2022)   PRAPARE - Administrator, Civil Service (Medical): No    Lack of Transportation (Non-Medical): No  Physical Activity: Inactive (11/24/2022)   Exercise Vital Sign    Days of Exercise per Week: 0 days    Minutes of Exercise per Session: 0 min  Stress: Stress Concern Present (11/24/2022)   [employer] of Occupational Health - Occupational Stress Questionnaire    Feeling of Stress : To some extent  Social Connections: Moderately Isolated (11/24/2022)   Social Connection and Isolation Panel [NHANES]    Frequency of Communication with Friends and Family: More than three times a week    Frequency of Social Gatherings with Friends and Family: Not on file    Attends Religious Services: Never    Database administrator or Organizations: No    Attends Banker Meetings: Never     Marital Status: Married   Outpatient Medications Prior to Visit  Medication Sig   acetaminophen (TYLENOL) 160 MG chewable tablet Chew 160 mg by mouth as needed for pain.   albuterol (VENTOLIN HFA) 108 (90 Base) MCG/ACT inhaler Inhale 1-2 puffs into the lungs every 6 (six) hours as needed for wheezing or shortness of breath.   diphenhydrAMINE (BENADRYL) 25 mg capsule Take 25 mg by mouth as needed.   Doxylamine-Pyridoxine (DICLEGIS) 10-10 MG TBEC Take 2 tablets by mouth at bedtime. If symptoms persist, add one tablet in the morning and one in the afternoon   Prenatal Vit-Fe Fumarate-FA (PRENATAL PO) Take by mouth.   promethazine (PHENERGAN) 12.5 MG tablet Take 1 tablet (12.5 mg total) by mouth every 6 (six) hours as needed for nausea or vomiting.   No facility-administered medications prior to visit.   No Known Allergies  Immunization History  Administered Date(s) Administered   Influenza,inj,Quad PF,6+ Mos 01/27/2017, 01/06/2018   Influenza-Unspecified 01/25/2020   PFIZER(Purple Top)SARS-COV-2 Vaccination 12/13/2019, 01/10/2020   PPD Test 05/22/2022   Tdap 01/27/2017    Health Maintenance  Topic Date Due   COVID-19 Vaccine (3 - Pfizer risk series) 02/07/2020   INFLUENZA VACCINE  11/06/2022   Cervical Cancer Screening (Pap smear)  05/12/2023   DTaP/Tdap/Td (2 - Td or Tdap) 01/28/2027   Hepatitis C Screening  Completed   HIV Screening  Completed   HPV VACCINES  Aged Out    Patient Care Team: [facility] as PCP - General  Review of Systems  {Insert previous labs (optional):23779} {See past labs  Heme  Chem  Endocrine  Serology  Results Review (optional):1}   Objective    LMP 10/10/2022 (Exact Date)  {Insert last BP/Wt (optional):23777}{See vitals history (optional):1}   Physical Exam .pesec  Depression Screen    01/06/2023   11:39 AM 08/19/2021    2:05 PM 06/13/2021    8:53 AM 05/22/2021    3:00 PM  PHQ 2/9 Scores  PHQ - 2 Score 3     PHQ- 9  Score 9        Information is confidential and restricted. Go to Review Flowsheets to unlock data.   No results found for any visits on 02/17/23.  Assessment & Plan     There are no diagnoses  linked to this encounter. Marland Kitchenapsecabridge  ***  No follow-ups on file.     I discussed the assessment and treatment plan with the patient  The patient was provided an opportunity to ask questions and all were answered. The patient agreed with the plan and demonstrated an understanding of the instructions.   The patient was advised to call back or seek an in-person evaluation if the symptoms worsen or if the condition fails to improve as anticipated.    Sherlyn Hay, DO  [facility] [phone] (phone) [phone] (fax)  [facility]

## 2023-02-18 ENCOUNTER — Other Ambulatory Visit: Payer: Self-pay | Admitting: Licensed Practical Nurse

## 2023-02-18 DIAGNOSIS — O219 Vomiting of pregnancy, unspecified: Secondary | ICD-10-CM

## 2023-02-18 MED ORDER — PROMETHAZINE HCL 12.5 MG PO TABS: 12.5000 mg | ORAL_TABLET | Freq: Four times a day (QID) | ORAL | 0 refills | Status: DC | PRN

## 2023-02-19 ENCOUNTER — Ambulatory Visit: Payer: Medicaid Other

## 2023-02-19 DIAGNOSIS — Z3A18 18 weeks gestation of pregnancy: Secondary | ICD-10-CM | POA: Diagnosis not present

## 2023-02-19 DIAGNOSIS — Z3480 Encounter for supervision of other normal pregnancy, unspecified trimester: Secondary | ICD-10-CM

## 2023-02-19 DIAGNOSIS — Z3689 Encounter for other specified antenatal screening: Secondary | ICD-10-CM

## 2023-03-03 ENCOUNTER — Encounter: Payer: Self-pay | Admitting: Certified Nurse Midwife

## 2023-03-03 ENCOUNTER — Other Ambulatory Visit (HOSPITAL_COMMUNITY)
Admission: RE | Admit: 2023-03-03 | Discharge: 2023-03-03 | Disposition: A | Discharge status: Home / Self Care | Payer: Medicaid Other | Source: Ambulatory Visit | Attending: Certified Nurse Midwife | Admitting: Certified Nurse Midwife

## 2023-03-03 ENCOUNTER — Ambulatory Visit (INDEPENDENT_AMBULATORY_CARE_PROVIDER_SITE_OTHER): Payer: Medicaid Other | Admitting: Certified Nurse Midwife

## 2023-03-03 VITALS — BP 95/58 | HR 72 | Wt 147.7 lb

## 2023-03-03 DIAGNOSIS — N898 Other specified noninflammatory disorders of vagina: Secondary | ICD-10-CM

## 2023-03-03 DIAGNOSIS — Z3A20 20 weeks gestation of pregnancy: Secondary | ICD-10-CM

## 2023-03-03 DIAGNOSIS — O23592 Infection of other part of genital tract in pregnancy, second trimester: Secondary | ICD-10-CM | POA: Insufficient documentation

## 2023-03-03 DIAGNOSIS — O26892 Other specified pregnancy related conditions, second trimester: Secondary | ICD-10-CM

## 2023-03-03 DIAGNOSIS — Z3480 Encounter for supervision of other normal pregnancy, unspecified trimester: Secondary | ICD-10-CM

## 2023-03-03 LAB — POCT URINALYSIS DIPSTICK OB
Bilirubin, UA: NEGATIVE
Blood, UA: NEGATIVE
Glucose, UA: NEGATIVE
Ketones, UA: NEGATIVE
Leukocytes, UA: NEGATIVE
Nitrite, UA: NEGATIVE
POC,PROTEIN,UA: NEGATIVE
Spec Grav, UA: 1.015 (ref 1.010–1.025)
Urobilinogen, UA: 0.2 U/dL
pH, UA: 6 (ref 5.0–8.0)

## 2023-03-03 NOTE — Progress Notes (Signed)
    Return Prenatal Note   Subjective   27 y.o. [id number] at [redacted]w[redacted]d presents for this follow-up prenatal visit.  Allison Black reports frequent headaches, occasional nosebleeds, upper back pain, increased vaginal discharge but no irritation or itching Patient reports: Movement: Present Contractions: Not present  Objective   Flow sheet Vitals: Pulse Rate: 72 BP: (!) 95/58 Fundal Height: 20 cm Fetal Heart Rate (bpm): 155 Total weight gain: 25 lb 11.2 oz (11.7 kg)  General Appearance  No acute distress, well appearing, and well nourished Pulmonary   Normal work of breathing Neurologic   Alert and oriented to person, place, and time Psychiatric   Mood and affect within normal limits  Assessment/Plan   Plan  27 y.o. [id number] at [redacted]w[redacted]d presents for follow-up OB visit. Reviewed prenatal record including previous visit note.  1. Supervision of other normal pregnancy, antepartum - POC Urinalysis Dipstick OB  2. [redacted] weeks gestation of pregnancy - POC Urinalysis Dipstick OB  3. Vaginal discharge during pregnancy in second trimester - Cervicovaginal ancillary only   Ultrasound reviewed, comfort measures for back pain consider support belt), headaches reviewed & information sent via MyChart.  Orders Placed This Encounter  Procedures   POC Urinalysis Dipstick OB   Return in 4 weeks (on 03/31/2023) for ROB.   Future Appointments  Date Time Provider Department Center  03/10/2023 10:00 AM Sherlyn Hay, DO [facility]  04/02/2023  8:15 AM Dominica Severin, CNM [facility] None    For next visit:  continue with routine prenatal care     Dominica Severin, CNM  03/03/2411:06 PM

## 2023-03-03 NOTE — Patient Instructions (Signed)

## 2023-03-04 LAB — CERVICOVAGINAL ANCILLARY ONLY
Bacterial Vaginitis (gardnerella): NEGATIVE
Candida Glabrata: NEGATIVE
Candida Vaginitis: NEGATIVE
Comment: NEGATIVE
Comment: NEGATIVE
Comment: NEGATIVE
Comment: NEGATIVE
Trichomonas: NEGATIVE

## 2023-03-10 ENCOUNTER — Ambulatory Visit: Payer: Medicaid Other | Admitting: Family Medicine

## 2023-03-10 ENCOUNTER — Encounter: Payer: Self-pay | Admitting: Family Medicine

## 2023-03-10 VITALS — BP 106/70 | HR 95 | Ht 62.0 in | Wt 157.0 lb

## 2023-03-10 DIAGNOSIS — R233 Spontaneous ecchymoses: Secondary | ICD-10-CM | POA: Insufficient documentation

## 2023-03-10 DIAGNOSIS — F1211 Cannabis abuse, in remission: Secondary | ICD-10-CM

## 2023-03-10 DIAGNOSIS — R040 Epistaxis: Secondary | ICD-10-CM | POA: Diagnosis not present

## 2023-03-10 DIAGNOSIS — J4520 Mild intermittent asthma, uncomplicated: Secondary | ICD-10-CM | POA: Diagnosis not present

## 2023-03-10 DIAGNOSIS — O2622 Pregnancy care for patient with recurrent pregnancy loss, second trimester: Secondary | ICD-10-CM | POA: Insufficient documentation

## 2023-03-10 DIAGNOSIS — Z8639 Personal history of other endocrine, nutritional and metabolic disease: Secondary | ICD-10-CM

## 2023-03-10 DIAGNOSIS — Z7689 Persons encountering health services in other specified circumstances: Secondary | ICD-10-CM

## 2023-03-10 DIAGNOSIS — F1221 Cannabis dependence, in remission: Secondary | ICD-10-CM | POA: Diagnosis not present

## 2023-03-10 DIAGNOSIS — F319 Bipolar disorder, unspecified: Secondary | ICD-10-CM

## 2023-03-10 NOTE — Progress Notes (Signed)
New patient visit   Patient: Allison Black   DOB: October 04, 1995   27 y.o. Female  MRN: [id number] Visit Date: 03/10/2023  Today's healthcare provider: Sherlyn Hay, DO   Chief Complaint  Patient presents with   New Patient (Initial Visit)    Establish care with provider at this practice.    Subjective    Allison Black is a 27 y.o. female who presents today as a new patient to establish care.  HPI HPI     New Patient (Initial Visit)    Additional comments: Establish care with provider at this practice.       Last edited by Theola Sequin, CMA on 03/10/2023 10:15 AM.      Current Pregnancy [id number]  [redacted]w[redacted]d - incl. 2 miscarriages   The patient, currently pregnant, presents to establish care. She reports a history of intermittent dizziness, which was more severe in the early stages of pregnancy but has since improved. The dizziness is described as a sudden sensation of the head moving, even while sitting still, and is not associated with any specific triggers.  The patient also reports a history of anxiety disorder, bipolar disorder, and depression. She experienced shortness of breath early in pregnancy, initially attributed to asthma, but later identified as panic attacks. The patient is not currently taking any medications for these conditions due to concerns related to pregnancy.  Nausea was a significant issue earlier in pregnancy, managed with promethazine. The patient also tried Unisom and vitamin B6, but found these ineffective. The nausea has since improved.  The patient reports chronic headaches, which can be severe enough to confine her to bed for several days. These headaches are not associated with any specific triggers and have not occurred recently.  The patient has a history of significant weight loss when not pregnant, associated with nausea and stomach pain. She reports feeling sick all the time when not pregnant, but these symptoms resolve during pregnancy. The  patient has seen a gastroenterologist once for these issues.  The patient also reports a history of nosebleeds, which have been more frequent during the current pregnancy. She has a history of asthma, but has not needed to use her albuterol inhaler recently.  The patient has a history of miscarriages and has concerns about the health of her current pregnancy. She reports a significant improvement in her mental health symptoms during pregnancy, but has experienced postpartum depression in the past.  The patient has a history of tobacco use but quit upon becoming pregnant. She has a history of marijuana use, which she found helpful for nausea and stomach pain, but has also quit this upon becoming pregnant.  The patient has a family history of lupus, and there is a concern about potential coagulation issues due to recurrent nosebleeds and a history of miscarriages. The patient has not been tested for lupus or coagulation disorders in the past.   Past Medical History:  Diagnosis Date   Asthma    Bipolar disorder (HCC)    Family history of ovarian cancer    5/23 cancer genetic testing letter sent   Heart murmur    Long term current use of cannabis 04/23/2021   Panic attack    Past Surgical History:  Procedure Laterality Date   NO PAST SURGERIES     Family Status  Relation Name Status   Mother  Alive   Father  Alive   Sister  Alive   Sister  Alive   Sister  Alive  Sister not sure Alive   Brother  Alive   Brother  Alive   MGM  Alive   MGF  Alive   PGM  Alive   PGF  Alive   Mat Aunt  Alive   Mat Aunt  Alive   Mat Aunt  Alive   Mat Aunt  Deceased   Cousin not sure of all dsxs Alive  No partnership data on file   Family History  Problem Relation Age of Onset   Bipolar disorder Mother    Lupus Mother    Healthy Father    Mental illness Sister    Asperger's syndrome Sister    Depression Sister    ADD / ADHD Sister    Healthy Brother    Autism Brother    Anxiety disorder  Brother    Diabetes Maternal Grandmother    Healthy Maternal Grandfather    Parkinson's disease Paternal Grandmother    Heart failure Paternal Grandmother    Heart Problems Paternal Grandfather    Bipolar disorder Maternal Aunt    Ovarian cancer Maternal Aunt    Bipolar disorder Maternal Aunt    Bipolar disorder Maternal Aunt    Schizophrenia Maternal Aunt    Suicidality Maternal Aunt    Diabetes Cousin    Social History   Socioeconomic History   Marital status: Married    Spouse name: [name]   Number of children: 2   Years of education: 12   Highest education level: Not on file  Occupational History   Occupation: Scientist, [employer]  Tobacco Use   Smoking status: Former    Types: Cigarettes   Smokeless tobacco: Never  Vaping Use   Vaping status: Former  Substance and Sexual Activity   Alcohol use: Not Currently    Alcohol/week: 1.0 standard drink of alcohol    Types: 1 Shots of liquor per week    Comment: 1-2 shots a few times a month   Drug use: Not Currently    Frequency: 28.0 times per week    Types: Marijuana    Comment: stoped with 2024 preg   Sexual activity: Yes    Partners: Male    Birth control/protection: None  Other Topics Concern   Not on file  Social History Narrative   Son - [name]    Daughter - [name]   Social Drivers of Health   Financial Resource Strain: Low Risk  (11/24/2022)   Overall Financial Resource Strain (CARDIA)    Difficulty of Paying Living Expenses: Not hard at all  Food Insecurity: No Food Insecurity (11/24/2022)   Hunger Vital Sign    Worried About Running Out of Food in the Last Year: Never true    Ran Out of Food in the Last Year: Never true  Transportation Needs: No Transportation Needs (11/24/2022)   PRAPARE - Administrator, Civil Service (Medical): No    Lack of Transportation (Non-Medical): No  Physical Activity: Inactive (11/24/2022)   Exercise Vital Sign    Days of Exercise per Week: 0 days     Minutes of Exercise per Session: 0 min  Stress: Stress Concern Present (11/24/2022)   [employer] of Occupational Health - Occupational Stress Questionnaire    Feeling of Stress : To some extent  Social Connections: Moderately Isolated (11/24/2022)   Social Connection and Isolation Panel [NHANES]    Frequency of Communication with Friends and Family: More than three times a week    Frequency of Social Gatherings with Friends  and Family: Not on file    Attends Religious Services: Never    Active Member of Clubs or Organizations: No    Attends Banker Meetings: Never    Marital Status: Married   Outpatient Medications Prior to Visit  Medication Sig Note   acetaminophen (TYLENOL) 160 MG chewable tablet Chew 160 mg by mouth as needed for pain.    albuterol (VENTOLIN HFA) 108 (90 Base) MCG/ACT inhaler Inhale 1-2 puffs into the lungs every 6 (six) hours as needed for wheezing or shortness of breath.    diphenhydrAMINE (BENADRYL) 25 mg capsule Take 25 mg by mouth as needed.    Prenatal Vit-Fe Fumarate-FA (PRENATAL PO) Take by mouth.    promethazine (PHENERGAN) 12.5 MG tablet Take 1 tablet (12.5 mg total) by mouth every 6 (six) hours as needed for nausea or vomiting.    [DISCONTINUED] Doxylamine-Pyridoxine (DICLEGIS) 10-10 MG TBEC Take 2 tablets by mouth at bedtime. If symptoms persist, add one tablet in the morning and one in the afternoon (Patient not taking: Reported on 03/10/2023) 03/10/2023: generic version (unisom and vitamin B6) did not help   No facility-administered medications prior to visit.   No Known Allergies  Immunization History  Administered Date(s) Administered   Influenza,inj,Quad PF,6+ Mos 01/27/2017, 01/06/2018   Influenza-Unspecified 01/25/2020   PFIZER(Purple Top)SARS-COV-2 Vaccination 12/13/2019, 01/10/2020   PPD Test 05/22/2022   Tdap 01/27/2017    Health Maintenance  Topic Date Due   Pneumococcal Vaccine 37-33 Years old (1 of 2 - PCV) Never done    Cervical Cancer Screening (Pap smear)  05/12/2023   COVID-19 Vaccine (3 - Pfizer risk series) 03/26/2023 (Originally 02/07/2020)   INFLUENZA VACCINE  03/30/2023 (Originally 11/06/2022)   DTaP/Tdap/Td (2 - Td or Tdap) 01/28/2027   Hepatitis C Screening  Completed   HIV Screening  Completed   HPV VACCINES  Aged Out    Patient Care Team: Yaris Ferrell, Monico Blitz, DO as PCP - General (Family Medicine)  Review of Systems  Constitutional:  Negative for chills, fatigue and fever.  HENT:  Negative for congestion, ear pain, rhinorrhea, sneezing and sore throat.   Eyes: Negative.  Negative for pain and redness.  Respiratory:  Positive for shortness of breath (thinks is related to panic attacks). Negative for cough and wheezing.   Cardiovascular:  Negative for chest pain and leg swelling.  Gastrointestinal:  Negative for abdominal pain, blood in stool, constipation, diarrhea and nausea.  Endocrine: Negative for polydipsia and polyphagia.  Genitourinary: Negative.  Negative for dysuria, flank pain, hematuria, pelvic pain, vaginal bleeding and vaginal discharge.  Musculoskeletal:  Negative for arthralgias, back pain, gait problem and joint swelling.  Skin:  Negative for rash.  Neurological: Negative.  Negative for dizziness, tremors, seizures, weakness, light-headedness, numbness and headaches.  Hematological:  Negative for adenopathy.  Psychiatric/Behavioral: Negative.  Negative for behavioral problems, confusion and dysphoric mood. The patient is not nervous/anxious and is not hyperactive.         Objective    BP 106/70 (BP Location: Left Arm, Patient Position: Sitting, Cuff Size: Normal)   Pulse 95   Ht 5\' 2"  (1.575 m)   Wt 157 lb (71.2 kg)   LMP 10/10/2022 (Exact Date)   SpO2 100%   BMI 28.72 kg/m     Physical Exam Vitals and nursing note reviewed.  Constitutional:      General: She is not in acute distress.    Appearance: Normal appearance.  HENT:     Head: Normocephalic and  atraumatic.  Eyes:     General: No scleral icterus.    Conjunctiva/sclera: Conjunctivae normal.  Cardiovascular:     Rate and Rhythm: Normal rate.  Pulmonary:     Effort: Pulmonary effort is normal.  Abdominal:     Comments: Protuberant s/t gravid uterus  Neurological:     Mental Status: She is alert and oriented to person, place, and time. Mental status is at baseline.  Psychiatric:        Mood and Affect: Mood normal.        Behavior: Behavior normal.     Depression Screen    03/10/2023   10:35 AM 01/06/2023   11:39 AM 08/19/2021    2:05 PM 06/13/2021    8:53 AM  PHQ 2/9 Scores  PHQ - 2 Score 0 3    PHQ- 9 Score 0 9       Information is confidential and restricted. Go to Review Flowsheets to unlock data.   No results found for any visits on 03/10/23.  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 Reports occasional dizziness, chronic headaches, and managed nausea with promethazine. No chest pain or dyspnea. History of anxiety, bipolar disorder, and depression, currently off medication due to pregnancy. Discussed flu and RSV vaccines, with strong recommendation for RSV vaccine based on past experience with older child requiring nebulizer treatments. - Discuss flu and RSV vaccine with OB at next appointment, per patient preference - Continue prenatal vitamins - Monitor for chronic headaches and dizziness  Orders: -     Protime-INR -     APTT -     Lupus anticoagulant panel  Establishing care with new doctor, encounter for  Mild intermittent asthma without complication Assessment & Plan: Asthma managed with albuterol inhaler. No recent exacerbations requiring oral steroids. Occasional dyspnea likely exacerbated by pregnancy. Discussed potential use of Symbicort if symptoms worsen, with informed consent about thrush risk and need to rinse and spit after use. -  Continue albuterol inhaler as needed - Consider Symbicort if symptoms worsen - Use Nasonex for nasal congestion and epistaxis   Bipolar disease during pregnancy, antepartum (HCC) Assessment & Plan: Anxiety, bipolar disorder, and depression, currently off medication due to pregnancy. Reports feeling better during pregnancy but anticipates postpartum depression. Discussed plan to restart psychiatric medications postpartum, with informed consent about risks and benefits. - Restart psychiatric medications postpartum - Monitor mental health closely postpartum    Cannabis use disorder, moderate, in early remission, in controlled environment, dependence (HCC) Assessment & Plan: No acute concerns.  Continue to monitor.   History of high cholesterol Assessment & Plan: Will recheck postpartum.   Easy bruising Assessment & Plan: Patient with history of 2 miscarriages and easy bruising.  Will check clotting studies and lupus anticoagulant panel today.  Orders: -     Protime-INR -     APTT -     Lupus anticoagulant panel  Recurrent epistaxis -     Protime-INR -     APTT -     Lupus anticoagulant panel  Chronic Nausea and Weight Loss Chronic nausea and weight loss when not pregnant, improved during pregnancy. Previous incomplete GI evaluation due to insurance issues. Discussed need for further GI evaluation postpartum, including potential endoscopy, with informed consent about risks and benefits. - Refer to GI specialist postpartum - Follow low fiber diet post-delivery if symptoms recur  General Health Maintenance Discussed importance of  flu and RSV vaccines during pregnancy. History of hypercholesterolemia and anemia during pregnancy.  - Discuss flu and RSV vaccine with OB at next appointment, per patient preference - Plan lipid panel and cholesterol check postpartum - Monitor hemoglobin levels during pregnancy  Follow-up - Monitor for postpartum depression and restart psychiatric  medications as needed - Refer to GI specialist for further evaluation of chronic nausea and weight loss.   Return in about 7 months (around 10/08/2023) for CPE, recheck GI s/sx.     I discussed the assessment and treatment plan with the patient  The patient was provided an opportunity to ask questions and all were answered. The patient agreed with the plan and demonstrated an understanding of the instructions.   The patient was advised to call back or seek an in-person evaluation if the symptoms worsen or if the condition fails to improve as anticipated.  Total time was 60 minutes. That includes chart review before the visit, the actual patient visit, and time spent on documentation after the visit.    Sherlyn Hay, DO  [facility] [phone] (phone) [phone] (fax)  [facility]

## 2023-03-21 NOTE — Assessment & Plan Note (Signed)
Will recheck postpartum.

## 2023-03-21 NOTE — Assessment & Plan Note (Signed)
Patient with history of 2 miscarriages and easy bruising.  Will check clotting studies and lupus anticoagulant panel today.

## 2023-03-21 NOTE — Assessment & Plan Note (Addendum)
Third pregnancy at [redacted] weeks gestation. Reports occasional dizziness, chronic headaches, and managed nausea with promethazine. No chest pain or dyspnea. History of anxiety, bipolar disorder, and depression, currently off medication due to pregnancy. Discussed flu and RSV vaccines, with strong recommendation for RSV vaccine based on past experience with older child requiring nebulizer treatments. - Discuss flu and RSV vaccine with OB at next appointment, per patient preference - Continue prenatal vitamins - Monitor for chronic headaches and dizziness

## 2023-03-21 NOTE — Assessment & Plan Note (Signed)
No acute concerns.  Continue to monitor.

## 2023-03-21 NOTE — Assessment & Plan Note (Signed)
Anxiety, bipolar disorder, and depression, currently off medication due to pregnancy. Reports feeling better during pregnancy but anticipates postpartum depression. Discussed plan to restart psychiatric medications postpartum, with informed consent about risks and benefits. - Restart psychiatric medications postpartum - Monitor mental health closely postpartum

## 2023-03-21 NOTE — Assessment & Plan Note (Signed)
Asthma managed with albuterol inhaler. No recent exacerbations requiring oral steroids. Occasional dyspnea likely exacerbated by pregnancy. Discussed potential use of Symbicort if symptoms worsen, with informed consent about thrush risk and need to rinse and spit after use. - Continue albuterol inhaler as needed - Consider Symbicort if symptoms worsen - Use Nasonex for nasal congestion and epistaxis

## 2023-04-02 ENCOUNTER — Ambulatory Visit (INDEPENDENT_AMBULATORY_CARE_PROVIDER_SITE_OTHER): Payer: Medicaid Other | Admitting: Certified Nurse Midwife

## 2023-04-02 ENCOUNTER — Encounter: Payer: Self-pay | Admitting: Certified Nurse Midwife

## 2023-04-02 VITALS — BP 98/61 | HR 69 | Wt 166.4 lb

## 2023-04-02 DIAGNOSIS — Z130 Encounter for screening for diseases of the blood and blood-forming organs and certain disorders involving the immune mechanism: Secondary | ICD-10-CM

## 2023-04-02 DIAGNOSIS — Z131 Encounter for screening for diabetes mellitus: Secondary | ICD-10-CM

## 2023-04-02 DIAGNOSIS — Z3A28 28 weeks gestation of pregnancy: Secondary | ICD-10-CM

## 2023-04-02 DIAGNOSIS — Z3480 Encounter for supervision of other normal pregnancy, unspecified trimester: Secondary | ICD-10-CM

## 2023-04-02 DIAGNOSIS — Z369 Encounter for antenatal screening, unspecified: Secondary | ICD-10-CM

## 2023-04-02 DIAGNOSIS — Z3A24 24 weeks gestation of pregnancy: Secondary | ICD-10-CM

## 2023-04-02 DIAGNOSIS — Z113 Encounter for screening for infections with a predominantly sexual mode of transmission: Secondary | ICD-10-CM

## 2023-04-02 NOTE — Patient Instructions (Signed)

## 2023-04-02 NOTE — Progress Notes (Signed)
    Return Prenatal Note   Subjective   27 y.o. [id number] at [redacted]w[redacted]d presents for this follow-up prenatal visit.  Patient reports nosebleeds improved. Saw PCP to establish care, outside of pregnancy struggles with weight loss and low appetite, has GI referral from PCP.  Patient reports: Movement: Present Contractions: Not present  Objective   Flow sheet Vitals: Pulse Rate: 69 BP: 98/61 Fundal Height: 24 cm Fetal Heart Rate (bpm): 150 Total weight gain: 44 lb 6.4 oz (20.1 kg)  General Appearance  No acute distress, well appearing, and well nourished Pulmonary   Normal work of breathing Neurologic   Alert and oriented to person, place, and time Psychiatric   Mood and affect within normal limits  Assessment/Plan   Plan  27 y.o. [id number] at [redacted]w[redacted]d presents for follow-up OB visit. Reviewed prenatal record including previous visit note.  Supervision of other normal pregnancy, antepartum Reviewed kick counts and preterm labor warning signs. Instructed to call office or come to hospital with persistent headache, vision changes, regular contractions, leaking of fluid, decreased fetal movement or vaginal bleeding.  Anticipatory guidance for 1h GTT, vaccines reviewed.    Orders Placed This Encounter  Procedures   28 Week RH+Panel    Standing Status:   Future    Expected Date:   04/29/2023    Expiration Date:   03/28/2024   Return in 4 weeks (on 04/30/2023) for ROB & GDM screening.   Future Appointments  Date Time Provider Department Center  04/30/2023  8:20 AM [facility] None  04/30/2023  8:55 AM Dominica Severin, CNM [facility] None    For next visit:  ROB with 1 hour glucola, third trimester labs, and Tdap     Dominica Severin, CNM  12/26/241:28 PM

## 2023-04-02 NOTE — Assessment & Plan Note (Signed)
Reviewed kick counts and preterm labor warning signs. Instructed to call office or come to hospital with persistent headache, vision changes, regular contractions, leaking of fluid, decreased fetal movement or vaginal bleeding. 

## 2023-04-04 ENCOUNTER — Observation Stay
Admission: EM | Admit: 2023-04-04 | Discharge: 2023-04-04 | Disposition: A | Discharge status: Home / Self Care | Payer: Medicaid Other | Attending: Obstetrics and Gynecology | Admitting: Obstetrics and Gynecology

## 2023-04-04 ENCOUNTER — Other Ambulatory Visit: Payer: Self-pay | Admitting: Obstetrics

## 2023-04-04 ENCOUNTER — Emergency Department
Admission: EM | Admit: 2023-04-04 | Discharge: 2023-04-04 | Disposition: A | Discharge status: Home / Self Care | Payer: Medicaid Other | Source: Home / Self Care

## 2023-04-04 ENCOUNTER — Other Ambulatory Visit: Payer: Self-pay

## 2023-04-04 DIAGNOSIS — O212 Late vomiting of pregnancy: Secondary | ICD-10-CM | POA: Diagnosis present

## 2023-04-04 DIAGNOSIS — Z5321 Procedure and treatment not carried out due to patient leaving prior to being seen by health care provider: Secondary | ICD-10-CM | POA: Insufficient documentation

## 2023-04-04 DIAGNOSIS — Z79899 Other long term (current) drug therapy: Secondary | ICD-10-CM | POA: Diagnosis not present

## 2023-04-04 DIAGNOSIS — O219 Vomiting of pregnancy, unspecified: Principal | ICD-10-CM | POA: Diagnosis present

## 2023-04-04 DIAGNOSIS — R197 Diarrhea, unspecified: Secondary | ICD-10-CM | POA: Insufficient documentation

## 2023-04-04 DIAGNOSIS — Z87891 Personal history of nicotine dependence: Secondary | ICD-10-CM | POA: Diagnosis not present

## 2023-04-04 DIAGNOSIS — O99512 Diseases of the respiratory system complicating pregnancy, second trimester: Secondary | ICD-10-CM | POA: Insufficient documentation

## 2023-04-04 DIAGNOSIS — Z1152 Encounter for screening for COVID-19: Secondary | ICD-10-CM | POA: Insufficient documentation

## 2023-04-04 DIAGNOSIS — Z3A25 25 weeks gestation of pregnancy: Secondary | ICD-10-CM | POA: Diagnosis not present

## 2023-04-04 DIAGNOSIS — R1084 Generalized abdominal pain: Secondary | ICD-10-CM | POA: Insufficient documentation

## 2023-04-04 DIAGNOSIS — O99612 Diseases of the digestive system complicating pregnancy, second trimester: Secondary | ICD-10-CM | POA: Diagnosis not present

## 2023-04-04 DIAGNOSIS — J45909 Unspecified asthma, uncomplicated: Secondary | ICD-10-CM | POA: Insufficient documentation

## 2023-04-04 LAB — URINALYSIS, ROUTINE W REFLEX MICROSCOPIC
Bilirubin Urine: NEGATIVE
Glucose, UA: NEGATIVE mg/dL
Hgb urine dipstick: NEGATIVE
Ketones, ur: NEGATIVE mg/dL
Nitrite: NEGATIVE
Protein, ur: 30 mg/dL — AB
Specific Gravity, Urine: 1.027 (ref 1.005–1.030)
pH: 5 (ref 5.0–8.0)

## 2023-04-04 LAB — SARS CORONAVIRUS 2 BY RT PCR: SARS Coronavirus 2 by RT PCR: NEGATIVE

## 2023-04-04 MED ORDER — PROMETHAZINE HCL 12.5 MG PO TABS
12.5000 mg | ORAL_TABLET | Freq: Four times a day (QID) | ORAL | 0 refills | Status: DC | PRN
Start: 2023-04-04 — End: 2023-07-06

## 2023-04-04 MED ORDER — PROMETHAZINE HCL 25 MG PO TABS
25.0000 mg | ORAL_TABLET | Freq: Four times a day (QID) | ORAL | Status: DC | PRN
  Administered 2023-04-04: 25 mg via ORAL

## 2023-04-04 MED ORDER — PROMETHAZINE HCL 25 MG PO TABS: 12.5000 mg | ORAL_TABLET | Freq: Once | ORAL | Status: DC

## 2023-04-04 MED ORDER — PROMETHAZINE HCL 25 MG PO TABS
ORAL_TABLET | ORAL | Status: AC
  Filled 2023-04-04: qty 1

## 2023-04-04 NOTE — Discharge Summary (Signed)
Final Progress Note  Patient ID: Allison Black MRN: [id number] DOB/AGE: 1995-11-21 27 y.o.  Admit date: 04/04/2023 Admitting provider: Mirna Mires, CNM Discharge date: 04/04/2023   Admission Diagnoses: Nausea and vomiting in pregnancy [redacted] weeks gestation  Discharge Diagnoses:  Principal Problem:   Nausea and vomiting in pregnancy  Reactive fetal heart tones  History of Present Illness: The patient is a 27 y.o. female [phone] at [redacted]w[redacted]d who presents for  her complaint of having Nausea and vomiting over the last few days at home. She reports four episodes of vomiting today.and shares that she has not had much to eat or drink over the last two days. She denies any decreased fetal movement, denies any contractions or cramping, and reports that her baby is moving well.She had presented to the ED earlier today with the same complaint, but left due to her not wanting a CNA to draw her labwork ( she insisted on an RN draw), and she also contacted the Access nurse who spoke with this CNM and had medication sent to her pharmacy to address her symptoms which were not Obstetric in nature. The patient explains that she did not get her medication from the pharmacy today before it closed, so she made another trip to [facility].  Past Medical History:  Diagnosis Date   Asthma    Bipolar disorder (HCC)    Family history of ovarian cancer    5/23 cancer genetic testing letter sent   Heart murmur    Long term current use of cannabis 04/23/2021   Panic attack     Past Surgical History:  Procedure Laterality Date   NO PAST SURGERIES      No current facility-administered medications on file prior to encounter.   Current Outpatient Medications on File Prior to Encounter  Medication Sig Dispense Refill   acetaminophen (TYLENOL) 160 MG chewable tablet Chew 160 mg by mouth as needed for pain.     albuterol (VENTOLIN HFA) 108 (90 Base) MCG/ACT inhaler Inhale 1-2 puffs into the lungs every 6 (six) hours as  needed for wheezing or shortness of breath. 1 g 1   diphenhydrAMINE (BENADRYL) 25 mg capsule Take 25 mg by mouth as needed.     Prenatal Vit-Fe Fumarate-FA (PRENATAL PO) Take by mouth.     promethazine (PHENERGAN) 12.5 MG tablet Take 1 tablet (12.5 mg total) by mouth every 6 (six) hours as needed for nausea or vomiting. 10 tablet 0    No Known Allergies  Social History   Socioeconomic History   Marital status: Married    Spouse name: [name]   Number of children: 2   Years of education: 12   Highest education level: Not on file  Occupational History   Occupation: Scientist, [employer]  Tobacco Use   Smoking status: Former    Types: Cigarettes   Smokeless tobacco: Never  Vaping Use   Vaping status: Former  Substance and Sexual Activity   Alcohol use: Not Currently    Alcohol/week: 1.0 standard drink of alcohol    Types: 1 Shots of liquor per week    Comment: 1-2 shots a few times a month   Drug use: Not Currently    Frequency: 28.0 times per week    Types: Marijuana    Comment: stoped with 2024 preg   Sexual activity: Yes    Partners: Male    Birth control/protection: None  Other Topics Concern   Not on file  Social History Narrative   Son -  [name]    Daughter - [name]   Social Drivers of Corporate investment banker Strain: Low Risk  (11/24/2022)   Overall Financial Resource Strain (CARDIA)    Difficulty of Paying Living Expenses: Not hard at all  Food Insecurity: No Food Insecurity (11/24/2022)   Hunger Vital Sign    Worried About Running Out of Food in the Last Year: Never true    Ran Out of Food in the Last Year: Never true  Transportation Needs: No Transportation Needs (11/24/2022)   PRAPARE - Administrator, Civil Service (Medical): No    Lack of Transportation (Non-Medical): No  Physical Activity: Inactive (11/24/2022)   Exercise Vital Sign    Days of Exercise per Week: 0 days    Minutes of Exercise per Session: 0 min  Stress: Stress Concern  Present (11/24/2022)   [employer] of Occupational Health - Occupational Stress Questionnaire    Feeling of Stress : To some extent  Social Connections: Moderately Isolated (11/24/2022)   Social Connection and Isolation Panel [NHANES]    Frequency of Communication with Friends and Family: More than three times a week    Frequency of Social Gatherings with Friends and Family: Not on file    Attends Religious Services: Never    Database administrator or Organizations: No    Attends Banker Meetings: Never    Marital Status: Married  Catering manager Violence: Not At Risk (11/24/2022)   Humiliation, Afraid, Rape, and Kick questionnaire    Fear of Current or Ex-Partner: No    Emotionally Abused: No    Physically Abused: No    Sexually Abused: No    Family History  Problem Relation Age of Onset   Bipolar disorder Mother    Lupus Mother    Healthy Father    Mental illness Sister    Asperger's syndrome Sister    Depression Sister    ADD / ADHD Sister    Healthy Brother    Autism Brother    Anxiety disorder Brother    Diabetes Maternal Grandmother    Healthy Maternal Grandfather    Parkinson's disease Paternal Grandmother    Heart failure Paternal Grandmother    Heart Problems Paternal Grandfather    Bipolar disorder Maternal Aunt    Ovarian cancer Maternal Aunt    Bipolar disorder Maternal Aunt    Bipolar disorder Maternal Aunt    Schizophrenia Maternal Aunt    Suicidality Maternal Aunt    Diabetes Cousin      ROS   Physical Exam: LMP 10/10/2022 (Exact Date)   OBGyn Exam  Consults: None  Significant Findings/ Diagnostic Studies: labs:  Results for orders placed or performed during the hospital encounter of 04/04/23 (from the past 24 hours)  Urinalysis, Routine w reflex microscopic -Urine, Clean Catch     Status: Abnormal   Collection Time: 04/04/23  7:56 PM  Result Value Ref Range   Color, Urine YELLOW (A) YELLOW   APPearance HAZY (A) CLEAR    Specific Gravity, Urine 1.027 1.005 - 1.030   pH 5.0 5.0 - 8.0   Glucose, UA NEGATIVE NEGATIVE mg/dL   Hgb urine dipstick NEGATIVE NEGATIVE   Bilirubin Urine NEGATIVE NEGATIVE   Ketones, ur NEGATIVE NEGATIVE mg/dL   Protein, ur 30 (A) NEGATIVE mg/dL   Nitrite NEGATIVE NEGATIVE   Leukocytes,Ua TRACE (A) NEGATIVE   RBC / HPF 0-5 0 - 5 RBC/hpf   WBC, UA 0-5 0 - 5 WBC/hpf  Bacteria, UA RARE (A) NONE SEEN   Squamous Epithelial / HPF 11-20 0 - 5 /HPF   Mucus PRESENT      Procedures: EFM NST Baseline FHR: 150 baseline beats/min Variability: moderate Accelerations: present Decelerations: absent Tocometry: no contractions noted  Interpretation:  INDICATIONS: patient reassurance and rule out uterine contractions RESULTS:  A NST procedure was performed with FHR monitoring and a normal baseline established, appropriate time of 20-40 minutes of evaluation, and accels >2 seen w 15x15 characteristics.  Results show a REACTIVE NST.    Hospital Course: The patient was admitted to Labor and Delivery Triage for observation. She was interviewed and then placed on the fetal monitor. A clean catch urine sample was sent to evaluate for signs of decreased hydration (ketones, specific gravity) her urine result revealed no dehydration, with absence of ketones and a normal specific gravity. With a reactive Fetal heart strip, no contractions and normal urinalysis, she was provided an oral dose of Phenergen, instructed to continue taking the Immodium that she had at home, and encouraged to rehydrate with Pedialyte, sprite or other juices. She will also pick up medication for nausea that was sent in earlier today to her preferred pharmacy.   Discharge Condition: good  Disposition: Discharge disposition: 01-Home or Self Care       Diet: Clear liquid diet, advance as tolerated  Discharge Activity: Activity as tolerated  Discharge Instructions     Discharge activity:  No Restrictions   Complete by: As  directed    Discharge diet:  No restrictions   Complete by: As directed    Notify physician for a general feeling that "something is not right"   Complete by: As directed    Notify physician for increase or change in vaginal discharge   Complete by: As directed    Notify physician for intestinal cramps, with or without diarrhea, sometimes described as "gas pain"   Complete by: As directed    Notify physician for leaking of fluid   Complete by: As directed    Notify physician for low, dull backache, unrelieved by heat or Tylenol   Complete by: As directed    Notify physician for menstrual like cramps   Complete by: As directed    Notify physician for pelvic pressure   Complete by: As directed    Notify physician for uterine contractions.  These may be painless and feel like the uterus is tightening or the baby is  "balling up"   Complete by: As directed    Notify physician for vaginal bleeding   Complete by: As directed    PRETERM LABOR:  Includes any of the follwing symptoms that occur between 20 - [redacted] weeks gestation.  If these symptoms are not stopped, preterm labor can result in preterm delivery, placing your baby at risk   Complete by: As directed       Allergies as of 04/04/2023   No Known Allergies      Medication List     TAKE these medications    acetaminophen 160 MG chewable tablet Commonly known as: TYLENOL Chew 160 mg by mouth as needed for pain.   albuterol 108 (90 Base) MCG/ACT inhaler Commonly known as: VENTOLIN HFA Inhale 1-2 puffs into the lungs every 6 (six) hours as needed for wheezing or shortness of breath.   diphenhydrAMINE 25 mg capsule Commonly known as: BENADRYL Take 25 mg by mouth as needed.   PRENATAL PO Take by mouth.   promethazine 12.5 MG tablet Commonly  known as: PHENERGAN Take 1 tablet (12.5 mg total) by mouth every 6 (six) hours as needed for nausea or vomiting.         Total time spent taking care of this patient: 40  minutes  Signed: Mirna Mires, CNM  04/04/2023, 8:46 PM

## 2023-04-04 NOTE — ED Notes (Signed)
Pt now stating that she wants to be seen upstairs and that her doctor told her she should be seen upstairs. Pt came to ED complaining of GI symptoms and not complaining of ctx, ROM or bleeding. Pt states she has been having sharp pains to upper abdomen. Pt also refused bloodwork in triage. Pt stating "I'm not gonna sit her for 11 hours with sick people" when informed her that we still need her bloodwork and would normally see her in flex. Called upstairs, midwife on site told RN to tell this RN that pt would need to be medically cleared here for her CC before considering seeing her upstairs. Pt again stated "I'm not gonna sit here with sick people for 11 hours, I'm pregnant" and "I'm gonna tell my doctor about this". Pt walked out of lobby with steady gait.

## 2023-04-04 NOTE — ED Notes (Signed)
Pt refuses to let triage tech draw blood, states she wants a nurse to draw her blood.

## 2023-04-04 NOTE — Final Progress Note (Signed)
Please see Discharge Summary  Mirna Mires, CNM  04/04/2023 8:45 PM

## 2023-04-04 NOTE — ED Triage Notes (Signed)
Pt to ED for vomiting, diarrhea and body aches since Thursday night at [redacted] weeks pregnant. States hard to eat or drink anything because vomits. No urinary symptoms or vaginal bleeding. Pt is well-appearing, skin dry.

## 2023-04-08 NOTE — L&D Delivery Note (Signed)
 Delivery Note   Allison Black is a 28 y.o. [id number] at [redacted]w[redacted]d Estimated Date of Delivery: 07/17/23  PRE-OPERATIVE DIAGNOSIS:  1) [redacted]w[redacted]d pregnancy.    POST-OPERATIVE DIAGNOSIS:  1) [redacted]w[redacted]d pregnancy s/p Vaginal, Spontaneous 2)First degree laceration, not repaired   Delivery Type: Vaginal, Spontaneous   Delivery Anesthesia: Epidural  Labor Complications:  shoulder dystocia    ESTIMATED BLOOD LOSS: 100 ml    FINDINGS:   1) female infant, Apgar scores of 2   at 1 minute and 9   at 5 minutes and a birthweight of pending   SPECIMENS:   PLACENTA:   Appearance:  intact   Removal: Spontaneous     Disposition:  disposed  CORD BLOOD: Not Indicated  DISPOSITION:  Infant left in stable condition in the delivery room, with L&D personnel and mother,  NARRATIVE SUMMARY: Labor course:  Allison Black is a [id number] at [redacted]w[redacted]d who presented to Labor & Delivery for PROM. Labor proceeded with augmentation and she was found to be completely dilated at 2300. With excellent maternal pushing effort, she birthed a viable  infant at 2320. There was a nuchal cord. The shoulders did not deliver with gentle downward traction. A shoulder dystocia was identified and patient was placed in [place] and shoulders did not deliver with next push. The posterior arm was then delivered, followed by the body. The cord was doubly clamped and cut by CNM and given to transition nurse to be assessed at the warmer.  The placenta delivered spontaneously and was noted to be intact with a 3VC. A perineal and vaginal examination was performed. Episiotomy/Lacerations: 1st degree - not repaired  Mother and baby were left in stable condition.  Oley Balm, CNM  07/15/2023 11:46 PM

## 2023-04-30 ENCOUNTER — Other Ambulatory Visit: Payer: Medicaid Other

## 2023-04-30 ENCOUNTER — Ambulatory Visit (INDEPENDENT_AMBULATORY_CARE_PROVIDER_SITE_OTHER): Payer: Medicaid Other | Admitting: Certified Nurse Midwife

## 2023-04-30 VITALS — BP 110/72 | HR 86 | Wt 173.7 lb

## 2023-04-30 DIAGNOSIS — Z3A28 28 weeks gestation of pregnancy: Secondary | ICD-10-CM | POA: Diagnosis not present

## 2023-04-30 DIAGNOSIS — Z23 Encounter for immunization: Secondary | ICD-10-CM

## 2023-04-30 DIAGNOSIS — Z3480 Encounter for supervision of other normal pregnancy, unspecified trimester: Secondary | ICD-10-CM

## 2023-04-30 DIAGNOSIS — Z369 Encounter for antenatal screening, unspecified: Secondary | ICD-10-CM | POA: Diagnosis not present

## 2023-04-30 DIAGNOSIS — Z131 Encounter for screening for diabetes mellitus: Secondary | ICD-10-CM

## 2023-04-30 DIAGNOSIS — Z3483 Encounter for supervision of other normal pregnancy, third trimester: Secondary | ICD-10-CM | POA: Diagnosis not present

## 2023-04-30 DIAGNOSIS — Z130 Encounter for screening for diseases of the blood and blood-forming organs and certain disorders involving the immune mechanism: Secondary | ICD-10-CM

## 2023-04-30 DIAGNOSIS — Z113 Encounter for screening for infections with a predominantly sexual mode of transmission: Secondary | ICD-10-CM

## 2023-04-30 DIAGNOSIS — Z3482 Encounter for supervision of other normal pregnancy, second trimester: Secondary | ICD-10-CM | POA: Diagnosis not present

## 2023-04-30 LAB — POCT URINALYSIS DIPSTICK OB
Bilirubin, UA: NEGATIVE
Blood, UA: NEGATIVE
Glucose, UA: NEGATIVE
Ketones, UA: NEGATIVE
Leukocytes, UA: NEGATIVE
Nitrite, UA: NEGATIVE
POC,PROTEIN,UA: NEGATIVE
Spec Grav, UA: 1.01 (ref 1.010–1.025)
Urobilinogen, UA: 0.2 U/dL
pH, UA: 6.5 (ref 5.0–8.0)

## 2023-04-30 NOTE — Progress Notes (Signed)
    Return Prenatal Note   Subjective   28 y.o. [id number] at [redacted]w[redacted]d presents for this follow-up prenatal visit.  Patient feeling well, GDM screen today. Plans epidural for pain management in labor. Plans NFP for contraception. She had Norovirus recently and lost 6lb, was seen in ED but left due to needing to sit in waiting area & concern for exposure to other illnesses. She was then seen on L&D but reports the care she received did not resolve her symptoms, and she was not satisfied with this care due to her weight loss and continued n/v despite home measures. Symptoms have now resolved. She has regained the weight she lost. She usually gains~50lb in her pregnancies. Patient reports: Movement: Present Contractions: Not present  Objective   Flow sheet Vitals: Pulse Rate: 86 BP: 110/72 Fundal Height: 29 cm Fetal Heart Rate (bpm): 160 (FHR 180 with fetal movement) Total weight gain: 51 lb 11.2 oz (23.5 kg)  General Appearance  No acute distress, well appearing, and well nourished Pulmonary   Normal work of breathing Neurologic   Alert and oriented to person, place, and time Psychiatric   Mood and affect within normal limits  Assessment/Plan   Plan  28 y.o. [id number] at [redacted]w[redacted]d presents for follow-up OB visit. Reviewed prenatal record including previous visit note.  Supervision of other normal pregnancy, antepartum Reviewed kick counts and preterm labor warning signs. Instructed to call office or come to hospital with persistent headache, vision changes, regular contractions, leaking of fluid, decreased fetal movement or vaginal bleeding.      Orders Placed This Encounter  Procedures   Tdap vaccine greater than or equal to 7yo IM   POC Urinalysis Dipstick OB   Return in 2 weeks (on 05/14/2023) for ROB.   Future Appointments  Date Time Provider Department Center  05/14/2023  8:15 AM Dominica Severin, CNM [facility] None    For next visit:  continue with routine prenatal care      Dominica Severin, CNM  04/29/2509:57 AM

## 2023-04-30 NOTE — Patient Instructions (Addendum)
Third Trimester of Pregnancy  The third trimester of pregnancy is from week 28 through week 40. This is months 7 through 9. The third trimester is a time when your baby is growing fast. Body changes during your third trimester Your body continues to change during this time. The changes usually go away after your baby is born. Physical changes You will continue to gain weight. You may get stretch marks on your hips, belly, and breasts. Your breasts will keep growing and may hurt. A yellow fluid (colostrum) may leak from your breasts. This is the first milk you're making for your baby. Your hair may grow faster and get thicker. In some cases, you may get hair loss. Your belly button may stick out. You may have more swelling in your hands, face, or ankles. Health changes You may have heartburn. You may feel short of breath. This is caused by the uterus that is now bigger. You may have more aches in the pelvis, back, or thighs. You may have more tingling or numbness in your hands, arms, and legs. You may pee more often. You may have trouble pooping (constipation) or swollen veins in the butt that can itch or get painful (hemorrhoids). Other changes You may have more problems sleeping. You may notice the baby moving lower in your belly (dropping). You may have more fluid coming from your vagina. Your joints may feel loose, and you may have pain around your pelvic bone. Follow these instructions at home: Medicines Take medicines only as told by your health care provider. Some medicines are not safe during pregnancy. Your provider may change the medicines that you take. Do not take any medicines unless told to by your provider. Take a prenatal vitamin that has at least 600 micrograms (mcg) of folic acid. Do not use herbal medicines, illegal drugs, or medicines that are not approved by your provider. Eating and drinking While you're pregnant your body needs additional nutrition to help  support your growing baby. Talk with your provider about your nutritional needs. Activity Most women are able to exercise regularly during pregnancy. Exercise routines may need to change at the end of your pregnancy. Talk to your provider about your activities and exercise routine. Relieving pain and discomfort Rest often with your legs raised if you have leg cramps or low back pain. Take warm sitz baths to soothe pain from hemorrhoids. Use hemorrhoid cream if your provider says it's okay. Wear a good, supportive bra if your breasts hurt. Do not use hot tubs, steam rooms, or saunas. Do not douche. Do not use tampons or scented pads. Safety Talk to your provider before traveling far distances. Wear your seatbelt at all times when you're in a car. Talk to your provider if someone hits you, hurts you, or yells at you. Preparing for birth To prepare for your baby: Take childbirth and breastfeeding classes. Visit the hospital and tour the maternity area. Buy a rear-facing car seat. Learn how to install it in your car. General instructions Avoid cat litter boxes and soil used by cats. These things carry germs that can cause harm to your pregnancy and your baby. Do not drink alcohol, smoke, vape, or use products with nicotine or tobacco in them. If you need help quitting, talk with your provider. Keep all follow-up visits for your third trimester. Your provider will do more exams and tests during this trimester. Write down your questions. Take them to your prenatal visits. Your provider also will: Talk with you about  your overall health. Give you advice or refer you to specialists who can help with different needs, including: Mental health and counseling. Foods and healthy eating. Ask for help if you need help with food. Where to find more information American Pregnancy Association: americanpregnancy.org [employer]: acog.org Office on [employer] Health:  TravelLesson.ca Contact a health care provider if: You have a headache that does not go away when you take medicine. You have any of these problems: You can't eat or drink. You have nausea and vomiting. You have watery poop (diarrhea) for 2 days or more. You have pain when you pee, or your pee smells bad. You have been sick for 2 days or more and aren't getting better. Contact your provider right away if: You have any of these coming from your vagina: Abnormal discharge. Bad-smelling fluid. Bleeding. Your baby is moving less than usual. You have signs of labor: You have any contractions, belly cramping, or have pain in your pelvis or lower back before 37 weeks of pregnancy (preterm labor). You have regular contractions that are less than 5 minutes apart. Your water breaks. You have symptoms of high blood pressure or preeclampsia. These include: A severe, throbbing headache that does not go away. Sudden or extreme swelling of your face, hands, legs, or feet. Vision problems: You see spots. You have blurry vision. Your eyes are sensitive to light. If you can't reach your provider, go to an urgent care or emergency room. Get help right away if: You faint, become confused, or can't think clearly. You have chest pain or trouble breathing. You have any kind of injury, such as from a fall or a car crash. These symptoms may be an emergency. Call 911 right away. Do not wait to see if the symptoms will go away. Do not drive yourself to the hospital. This information is not intended to replace advice given to you by your health care provider. Make sure you discuss any questions you have with your health care provider. Document Revised: 12/25/2022 Document Reviewed: 07/25/2022 Elsevier Patient Education  2024 [employer]. Birth Control Options Birth control is also called contraception. Birth control prevents pregnancy. There are many types of birth control. Work with your health care  provider to find the best option for you. Birth control that uses hormones These types of birth control have hormones in them to prevent pregnancy. Birth control implant This is a small tube that is put into the skin of your arm. The tube can stay in for up to 3 years. Birth control shot These are shots you get every 3 months. Birth control pills This is a pill you take every day. You need to take it at the same time each day. Birth control patch This is a patch that you put on your skin. You change it 1 time each week for 3 weeks. After that, you take the patch off for 1 week. Vaginal ring  This is a soft plastic ring that you put in your vagina. The ring is left in for 3 weeks. Then, you take it out for 1 week. Then, you put a new ring in. Barrier methods  Female condom This is a thin covering that you put on the penis before sex. The condom is thrown away after sex. Female condom This is a soft, loose covering that you put in the vagina before sex. The condom is thrown away after sex. Diaphragm A diaphragm is a soft barrier that is shaped like  a bowl. It must be made to fit your body. You put it in the vagina before sex with a chemical that kills sperm called spermicide. A diaphragm should be left in the vagina for 6-8 hours after sex and taken out within 24 hours. You need to replace a diaphragm: Every 1-2 years. After giving birth. After gaining more than 15 lb (6.8 kg). If you have surgery on your pelvis. Cervical cap This is a small, soft cup that fits over the cervix. The cervix is the lowest part of the uterus. It's put in the vagina before sex, along with spermicide. The cap must be made for you. The cap should be left in for 6-8 hours after sex. It is taken out within 48 hours. A cervical cap must be prescribed and fit to your body by a provider. It should be replaced every 2 years. Sponge This is a small sponge that is put into the vagina before sex. It must be left in for at  least 6 hours after sex. It must be taken out within 30 hours and thrown away. Spermicides These are chemicals that kill or stop sperm from getting into the uterus. They may be a pill, cream, jelly, or foam that you put into your vagina. They should be used at least 10-15 minutes before sex. Intrauterine device An intrauterine device (IUD) is a device that's put in the uterus by a provider. There are two types: Hormone IUD. This kind can stay in for 3-5 years. Copper IUD. This kind can stay in for 10 years. Permanent birth control Female tubal ligation This is surgery to block the fallopian tubes. Female sterilization This is a surgery, called a vasectomy, to tie off the tubes that carry sperm in men. This method takes 3 months to work. Other forms of birth control must be used for 3 months. Natural planning methods This means not having sex on the days the female partner could get pregnant. Here are some types of natural planning birth control: Using a calendar: To keep track of the length of each menstrual cycle. To find out what days pregnancy can happen. To plan to not have sex on days when pregnancy can happen. Watching for signs of ovulation and not having sex during this time. The female partner can check for ovulation by keeping track of their temperature each day. They can also look for changes in the mucus that comes from the cervix. Where to find more information Centers for Disease Control and Prevention: TonerPromos.no. Then: Enter "birth control" in the search box. This information is not intended to replace advice given to you by your health care provider. Make sure you discuss any questions you have with your health care provider. Document Revised: 12/25/2022 Document Reviewed: 05/20/2022 Elsevier Patient Education  2024 [employer].  Tdap (Tetanus, Diphtheria, Pertussis) Vaccine: What You Need to Know Many vaccine information statements are available in Spanish and other  languages. See PromoAge.com.br. 1. Why get vaccinated? Tdap vaccine can prevent tetanus, diphtheria, and pertussis. Diphtheria and pertussis spread from person to person. Tetanus enters the body through cuts or wounds. TETANUS (T) causes painful stiffening of the muscles. Tetanus can lead to serious health problems, including being unable to open the mouth, having trouble swallowing and breathing, or death. DIPHTHERIA (D) can lead to difficulty breathing, heart failure, paralysis, or death. PERTUSSIS (aP), also known as "whooping cough," can cause uncontrollable, violent coughing that makes it hard to breathe, eat, or drink. Pertussis can be extremely  serious especially in babies and young children, causing pneumonia, convulsions, brain damage, or death. In teens and adults, it can cause weight loss, loss of bladder control, passing out, and rib fractures from severe coughing. 2. Tdap vaccine Tdap is only for children 7 years and older, adolescents, and adults.  Adolescents should receive a single dose of Tdap, preferably at age 72 or 12 years. Pregnant people should get a dose of Tdap during every pregnancy, preferably during the early part of the third trimester, to help protect the newborn from pertussis. Infants are most at risk for severe, life-threatening complications from pertussis. Adults who have never received Tdap should get a dose of Tdap. Also, adults should receive a booster dose of either Tdap or Td (a different vaccine that protects against tetanus and diphtheria but not pertussis) every 10 years, or after 5 years in the case of a severe or dirty wound or burn. Tdap may be given at the same time as other vaccines. 3. Talk with your health care provider Tell your vaccine provider if the person getting the vaccine: Has had an allergic reaction after a previous dose of any vaccine that protects against tetanus, diphtheria, or pertussis, or has any severe, life-threatening  allergies Has had a coma, decreased level of consciousness, or prolonged seizures within 7 days after a previous dose of any pertussis vaccine (DTP, DTaP, or Tdap) Has seizures or another nervous system problem Has ever had Guillain-Barr Syndrome (also called "GBS") Has had severe pain or swelling after a previous dose of any vaccine that protects against tetanus or diphtheria In some cases, your health care provider may decide to postpone Tdap vaccination until a future visit. People with minor illnesses, such as a cold, may be vaccinated. People who are moderately or severely ill should usually wait until they recover before getting Tdap vaccine.  Your health care provider can give you more information. 4. Risks of a vaccine reaction Pain, redness, or swelling where the shot was given, mild fever, headache, feeling tired, and nausea, vomiting, diarrhea, or stomachache sometimes happen after Tdap vaccination. People sometimes faint after medical procedures, including vaccination. Tell your provider if you feel dizzy or have vision changes or ringing in the ears.  As with any medicine, there is a very remote chance of a vaccine causing a severe allergic reaction, other serious injury, or death. 5. What if there is a serious problem? An allergic reaction could occur after the vaccinated person leaves the clinic. If you see signs of a severe allergic reaction (hives, swelling of the face and throat, difficulty breathing, a fast heartbeat, dizziness, or weakness), call 9-1-1 and get the person to the nearest hospital. For other signs that concern you, call your health care provider.  Adverse reactions should be reported to the Vaccine Adverse Event Reporting System (VAERS). Your health care provider will usually file this report, or you can do it yourself. Visit the VAERS website at www.vaers.LAgents.no or call 318-743-6202. VAERS is only for reporting reactions, and VAERS staff members do not give  medical advice. 6. The National Vaccine Injury Compensation Program The [employer] Vaccine Injury Compensation Program (VICP) is a federal program that was created to compensate people who may have been injured by certain vaccines. Claims regarding alleged injury or death due to vaccination have a time limit for filing, which may be as short as two years. Visit the VICP website at SpiritualWord.at or call 340-600-3933 to learn about the program and about filing a claim. 7.  How can I learn more? Ask your health care provider. Call your local or state health department. Visit the website of the Food and Drug Administration (FDA) for vaccine package inserts and additional information at FinderList.no. Contact the Centers for Disease Control and Prevention (CDC): Call 737-192-6409 (1-800-CDC-INFO) or Visit CDC's website at PicCapture.uy. Source: CDC Vaccine Information Statement Tdap (Tetanus, Diphtheria, Pertussis) Vaccine (11/11/2019) This same material is available at FootballExhibition.com.br for no charge. This information is not intended to replace advice given to you by your health care provider. Make sure you discuss any questions you have with your health care provider. Document Revised: 07/09/2022 Document Reviewed: 05/09/2022 Elsevier Patient Education  2024 [employer].

## 2023-04-30 NOTE — Assessment & Plan Note (Signed)
Reviewed kick counts and preterm labor warning signs. Instructed to call office or come to hospital with persistent headache, vision changes, regular contractions, leaking of fluid, decreased fetal movement or vaginal bleeding. 

## 2023-05-01 ENCOUNTER — Encounter: Payer: Self-pay | Admitting: Certified Nurse Midwife

## 2023-05-01 LAB — 28 WEEK RH+PANEL
Basophils Absolute: 0 10*3/uL (ref 0.0–0.2)
Basos: 1 %
EOS (ABSOLUTE): 0.1 10*3/uL (ref 0.0–0.4)
Eos: 2 %
Gestational Diabetes Screen: 133 mg/dL (ref 70–139)
HIV Screen 4th Generation wRfx: NONREACTIVE
Hematocrit: 34.6 % (ref 34.0–46.6)
Hemoglobin: 10.8 g/dL — ABNORMAL LOW (ref 11.1–15.9)
Immature Grans (Abs): 0.1 10*3/uL (ref 0.0–0.1)
Immature Granulocytes: 1 %
Lymphocytes Absolute: 1.3 10*3/uL (ref 0.7–3.1)
Lymphs: 16 %
MCH: 27.8 pg (ref 26.6–33.0)
MCHC: 31.2 g/dL — ABNORMAL LOW (ref 31.5–35.7)
MCV: 89 fL (ref 79–97)
Monocytes Absolute: 0.3 10*3/uL (ref 0.1–0.9)
Monocytes: 4 %
Neutrophils Absolute: 6.3 10*3/uL (ref 1.4–7.0)
Neutrophils: 76 %
Platelets: 220 10*3/uL (ref 150–450)
RBC: 3.88 x10E6/uL (ref 3.77–5.28)
RDW: 11.9 % (ref 11.7–15.4)
RPR Ser Ql: NONREACTIVE
WBC: 8.1 10*3/uL (ref 3.4–10.8)

## 2023-05-14 ENCOUNTER — Ambulatory Visit (INDEPENDENT_AMBULATORY_CARE_PROVIDER_SITE_OTHER): Payer: Medicaid Other | Admitting: Certified Nurse Midwife

## 2023-05-14 ENCOUNTER — Encounter: Payer: Self-pay | Admitting: Certified Nurse Midwife

## 2023-05-14 VITALS — BP 100/63 | HR 79 | Wt 176.0 lb

## 2023-05-14 DIAGNOSIS — Z3480 Encounter for supervision of other normal pregnancy, unspecified trimester: Secondary | ICD-10-CM | POA: Diagnosis not present

## 2023-05-14 DIAGNOSIS — Z3A30 30 weeks gestation of pregnancy: Secondary | ICD-10-CM

## 2023-05-14 DIAGNOSIS — R42 Dizziness and giddiness: Secondary | ICD-10-CM

## 2023-05-14 DIAGNOSIS — Z8639 Personal history of other endocrine, nutritional and metabolic disease: Secondary | ICD-10-CM

## 2023-05-14 NOTE — Patient Instructions (Signed)
 Third Trimester of Pregnancy  The third trimester of pregnancy is from week 28 through week 40. This is months 7 through 9. The third trimester is a time when your baby is growing fast. Body changes during your third trimester Your body continues to change during this time. The changes usually go away after your baby is born. Physical changes You will continue to gain weight. You may get stretch marks on your hips, belly, and breasts. Your breasts will keep growing and may hurt. A yellow fluid (colostrum) may leak from your breasts. This is the first milk you're making for your baby. Your hair may grow faster and get thicker. In some cases, you may get hair loss. Your belly button may stick out. You may have more swelling in your hands, face, or ankles. Health changes You may have heartburn. You may feel short of breath. This is caused by the uterus that is now bigger. You may have more aches in the pelvis, back, or thighs. You may have more tingling or numbness in your hands, arms, and legs. You may pee more often. You may have trouble pooping (constipation) or swollen veins in the butt that can itch or get painful (hemorrhoids). Other changes You may have more problems sleeping. You may notice the baby moving lower in your belly (dropping). You may have more fluid coming from your vagina. Your joints may feel loose, and you may have pain around your pelvic bone. Follow these instructions at home: Medicines Take medicines only as told by your health care provider. Some medicines are not safe during pregnancy. Your provider may change the medicines that you take. Do not take any medicines unless told to by your provider. Take a prenatal vitamin that has at least 600 micrograms (mcg) of folic acid. Do not use herbal medicines, illegal drugs, or medicines that are not approved by your provider. Eating and drinking While you're pregnant your body needs additional nutrition to help  support your growing baby. Talk with your provider about your nutritional needs. Activity Most women are able to exercise regularly during pregnancy. Exercise routines may need to change at the end of your pregnancy. Talk to your provider about your activities and exercise routine. Relieving pain and discomfort Rest often with your legs raised if you have leg cramps or low back pain. Take warm sitz baths to soothe pain from hemorrhoids. Use hemorrhoid cream if your provider says it's okay. Wear a good, supportive bra if your breasts hurt. Do not use hot tubs, steam rooms, or saunas. Do not douche. Do not use tampons or scented pads. Safety Talk to your provider before traveling far distances. Wear your seatbelt at all times when you're in a car. Talk to your provider if someone hits you, hurts you, or yells at you. Preparing for birth To prepare for your baby: Take childbirth and breastfeeding classes. Visit the hospital and tour the maternity area. Buy a rear-facing car seat. Learn how to install it in your car. General instructions Avoid cat litter boxes and soil used by cats. These things carry germs that can cause harm to your pregnancy and your baby. Do not drink alcohol, smoke, vape, or use products with nicotine or tobacco in them. If you need help quitting, talk with your provider. Keep all follow-up visits for your third trimester. Your provider will do more exams and tests during this trimester. Write down your questions. Take them to your prenatal visits. Your provider also will: Talk with you about  your overall health. Give you advice or refer you to specialists who can help with different needs, including: Mental health and counseling. Foods and healthy eating. Ask for help if you need help with food. Where to find more information American Pregnancy Association: americanpregnancy.org [employer]ians and [employer]necologists: acog.org Office on [employer] Health:  TravelLesson.ca Contact a health care provider if: You have a headache that does not go away when you take medicine. You have any of these problems: You can't eat or drink. You have nausea and vomiting. You have watery poop (diarrhea) for 2 days or more. You have pain when you pee, or your pee smells bad. You have been sick for 2 days or more and aren't getting better. Contact your provider right away if: You have any of these coming from your vagina: Abnormal discharge. Bad-smelling fluid. Bleeding. Your baby is moving less than usual. You have signs of labor: You have any contractions, belly cramping, or have pain in your pelvis or lower back before 37 weeks of pregnancy (preterm labor). You have regular contractions that are less than 5 minutes apart. Your water breaks. You have symptoms of high blood pressure or preeclampsia. These include: A severe, throbbing headache that does not go away. Sudden or extreme swelling of your face, hands, legs, or feet. Vision problems: You see spots. You have blurry vision. Your eyes are sensitive to light. If you can't reach your provider, go to an urgent care or emergency room. Get help right away if: You faint, become confused, or can't think clearly. You have chest pain or trouble breathing. You have any kind of injury, such as from a fall or a car crash. These symptoms may be an emergency. Call 911 right away. Do not wait to see if the symptoms will go away. Do not drive yourself to the hospital. This information is not intended to replace advice given to you by your health care provider. Make sure you discuss any questions you have with your health care provider. Document Revised: 12/25/2022 Document Reviewed: 07/25/2022 Elsevier Patient Education  2024 [employer].

## 2023-05-14 NOTE — Assessment & Plan Note (Signed)
 Reviewed kick counts and preterm labor warning signs. Instructed to call office or come to hospital with persistent headache, vision changes, regular contractions, leaking of fluid, decreased fetal movement or vaginal bleeding.

## 2023-05-14 NOTE — Progress Notes (Signed)
    Return Prenatal Note   Subjective   28 y.o. [id number] at [redacted]w[redacted]d presents for this follow-up prenatal visit.  Patient feeling well, active baby. Has gotten compression socks due to swelling, happens while working & on her feet. 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 Has supplies for baby-car seat, crib.  Patient reports: Movement: Present Contractions: Irritability  Objective   Flow sheet Vitals: Pulse Rate: 79 BP: 100/63 Fundal Height: 31 cm Fetal Heart Rate (bpm): 145 Presentation: Vertex ([place]) Total weight gain: 54 lb (24.5 kg)  General Appearance  No acute distress, well appearing, and well nourished Pulmonary   Normal work of breathing Neurologic   Alert and oriented to person, place, and time Psychiatric   Mood and affect within normal limits  Assessment/Plan   Plan  28 y.o. [id number] at [redacted]w[redacted]d presents for follow-up OB visit. Reviewed prenatal record including previous visit note.  Supervision of other normal pregnancy, antepartum Reviewed kick counts and preterm labor warning signs. Instructed to call office or come to hospital with persistent headache, vision changes, regular contractions, leaking of fluid, decreased fetal movement or vaginal bleeding.      No orders of the defined types were placed in this encounter.  Return in 2 weeks (on 05/28/2023) for ROB.   Future Appointments  Date Time Provider Department Center  05/28/2023  8:55 AM Jayne Harlene CROME, CNM [facility] None    For next visit:  continue with routine prenatal care     Harlene CROME Jayne, CNM  02/06/258:57 AM

## 2023-05-28 ENCOUNTER — Encounter: Payer: Medicaid Other | Admitting: Certified Nurse Midwife

## 2023-05-28 ENCOUNTER — Ambulatory Visit (INDEPENDENT_AMBULATORY_CARE_PROVIDER_SITE_OTHER): Payer: Medicaid Other | Admitting: Certified Nurse Midwife

## 2023-05-28 VITALS — BP 109/70 | HR 81 | Wt 182.0 lb

## 2023-05-28 DIAGNOSIS — Z3A32 32 weeks gestation of pregnancy: Secondary | ICD-10-CM | POA: Diagnosis not present

## 2023-05-28 DIAGNOSIS — Z3480 Encounter for supervision of other normal pregnancy, unspecified trimester: Secondary | ICD-10-CM

## 2023-05-28 DIAGNOSIS — J029 Acute pharyngitis, unspecified: Secondary | ICD-10-CM | POA: Diagnosis not present

## 2023-05-28 MED ORDER — PENICILLIN V POTASSIUM 500 MG PO TABS: 500.0000 mg | ORAL_TABLET | Freq: Two times a day (BID) | ORAL | 0 refills | Status: AC

## 2023-05-28 NOTE — Patient Instructions (Signed)
 Third Trimester of Pregnancy  The third trimester of pregnancy is from week 28 through week 40. This is months 7 through 9. The third trimester is a time when your baby is growing fast. Body changes during your third trimester Your body continues to change during this time. The changes usually go away after your baby is born. Physical changes You will continue to gain weight. You may get stretch marks on your hips, belly, and breasts. Your breasts will keep growing and may hurt. A yellow fluid (colostrum) may leak from your breasts. This is the first milk you're making for your baby. Your hair may grow faster and get thicker. In some cases, you may get hair loss. Your belly button may stick out. You may have more swelling in your hands, face, or ankles. Health changes You may have heartburn. You may feel short of breath. This is caused by the uterus that is now bigger. You may have more aches in the pelvis, back, or thighs. You may have more tingling or numbness in your hands, arms, and legs. You may pee more often. You may have trouble pooping (constipation) or swollen veins in the butt that can itch or get painful (hemorrhoids). Other changes You may have more problems sleeping. You may notice the baby moving lower in your belly (dropping). You may have more fluid coming from your vagina. Your joints may feel loose, and you may have pain around your pelvic bone. Follow these instructions at home: Medicines Take medicines only as told by your health care provider. Some medicines are not safe during pregnancy. Your provider may change the medicines that you take. Do not take any medicines unless told to by your provider. Take a prenatal vitamin that has at least 600 micrograms (mcg) of folic acid. Do not use herbal medicines, illegal drugs, or medicines that are not approved by your provider. Eating and drinking While you're pregnant your body needs additional nutrition to help  support your growing baby. Talk with your provider about your nutritional needs. Activity Most women are able to exercise regularly during pregnancy. Exercise routines may need to change at the end of your pregnancy. Talk to your provider about your activities and exercise routine. Relieving pain and discomfort Rest often with your legs raised if you have leg cramps or low back pain. Take warm sitz baths to soothe pain from hemorrhoids. Use hemorrhoid cream if your provider says it's okay. Wear a good, supportive bra if your breasts hurt. Do not use hot tubs, steam rooms, or saunas. Do not douche. Do not use tampons or scented pads. Safety Talk to your provider before traveling far distances. Wear your seatbelt at all times when you're in a car. Talk to your provider if someone hits you, hurts you, or yells at you. Preparing for birth To prepare for your baby: Take childbirth and breastfeeding classes. Visit the hospital and tour the maternity area. Buy a rear-facing car seat. Learn how to install it in your car. General instructions Avoid cat litter boxes and soil used by cats. These things carry germs that can cause harm to your pregnancy and your baby. Do not drink alcohol, smoke, vape, or use products with nicotine or tobacco in them. If you need help quitting, talk with your provider. Keep all follow-up visits for your third trimester. Your provider will do more exams and tests during this trimester. Write down your questions. Take them to your prenatal visits. Your provider also will: Talk with you about  your overall health. Give you advice or refer you to specialists who can help with different needs, including: Mental health and counseling. Foods and healthy eating. Ask for help if you need help with food. Where to find more information American Pregnancy Association: americanpregnancy.org [employer]ians and [employer]necologists: acog.org Office on [employer] Health:  TravelLesson.ca Contact a health care provider if: You have a headache that does not go away when you take medicine. You have any of these problems: You can't eat or drink. You have nausea and vomiting. You have watery poop (diarrhea) for 2 days or more. You have pain when you pee, or your pee smells bad. You have been sick for 2 days or more and aren't getting better. Contact your provider right away if: You have any of these coming from your vagina: Abnormal discharge. Bad-smelling fluid. Bleeding. Your baby is moving less than usual. You have signs of labor: You have any contractions, belly cramping, or have pain in your pelvis or lower back before 37 weeks of pregnancy (preterm labor). You have regular contractions that are less than 5 minutes apart. Your water breaks. You have symptoms of high blood pressure or preeclampsia. These include: A severe, throbbing headache that does not go away. Sudden or extreme swelling of your face, hands, legs, or feet. Vision problems: You see spots. You have blurry vision. Your eyes are sensitive to light. If you can't reach your provider, go to an urgent care or emergency room. Get help right away if: You faint, become confused, or can't think clearly. You have chest pain or trouble breathing. You have any kind of injury, such as from a fall or a car crash. These symptoms may be an emergency. Call 911 right away. Do not wait to see if the symptoms will go away. Do not drive yourself to the hospital. This information is not intended to replace advice given to you by your health care provider. Make sure you discuss any questions you have with your health care provider. Document Revised: 12/25/2022 Document Reviewed: 07/25/2022 Elsevier Patient Education  2024 [employer].

## 2023-05-28 NOTE — Progress Notes (Signed)
    Return Prenatal Note   Subjective   28 y.o. [id number] at 102w6d presents for this follow-up prenatal visit.  Patient reports her husband was hospitalized this week for pneumonia with strep throat, she is having a sore throat now as well, denies fevers or chills, endorses congestion & nosebleed. Patient reports: Movement: Present Contractions: Irritability  Objective   Flow sheet Vitals: Pulse Rate: 81 BP: 109/70 Total weight gain: 60 lb (27.2 kg)  General Appearance  No acute distress, well appearing, and well nourished HEENT   Erythema & enlarged tonsils noted, no exudate Pulmonary   Normal work of breathing Neurologic   Alert and oriented to person, place, and time Psychiatric   Mood and affect within normal limits  Assessment/Plan   Plan  28 y.o. [id number] at [redacted]w[redacted]d presents for follow-up OB visit. Reviewed prenatal record including previous visit note.  No problem-specific Assessment & Plan notes found for this encounter.      No orders of the defined types were placed in this encounter.  Return in 2 weeks (on 06/11/2023) for ROB.   No future appointments.  For next visit:  continue with routine prenatal care     Dominica Severin, CNM  02/20/253:39 PM

## 2023-05-28 NOTE — Assessment & Plan Note (Signed)
Reviewed kick counts and preterm labor warning signs. Instructed to call office or come to hospital with persistent headache, vision changes, regular contractions, leaking of fluid, decreased fetal movement or vaginal bleeding. Presumptive strep pharyngitis given exposure from both son & husband, will treat with penicillin. Recommend urgent care visit if no improvement or worsening despite antibiotics.

## 2023-06-01 ENCOUNTER — Ambulatory Visit
Admission: EM | Admit: 2023-06-01 | Discharge: 2023-06-01 | Disposition: A | Discharge status: Home / Self Care | Payer: Medicaid Other | Attending: Physician Assistant | Admitting: Physician Assistant

## 2023-06-01 ENCOUNTER — Encounter: Payer: Self-pay | Admitting: Certified Nurse Midwife

## 2023-06-01 DIAGNOSIS — J22 Unspecified acute lower respiratory infection: Secondary | ICD-10-CM | POA: Insufficient documentation

## 2023-06-01 DIAGNOSIS — J029 Acute pharyngitis, unspecified: Secondary | ICD-10-CM | POA: Insufficient documentation

## 2023-06-01 DIAGNOSIS — Z3A33 33 weeks gestation of pregnancy: Secondary | ICD-10-CM | POA: Insufficient documentation

## 2023-06-01 DIAGNOSIS — J45909 Unspecified asthma, uncomplicated: Secondary | ICD-10-CM | POA: Insufficient documentation

## 2023-06-01 DIAGNOSIS — R0981 Nasal congestion: Secondary | ICD-10-CM | POA: Insufficient documentation

## 2023-06-01 DIAGNOSIS — R058 Other specified cough: Secondary | ICD-10-CM | POA: Insufficient documentation

## 2023-06-01 LAB — GROUP A STREP BY PCR: Group A Strep by PCR: NOT DETECTED

## 2023-06-01 MED ORDER — AZITHROMYCIN 250 MG PO TABS: 250.0000 mg | ORAL_TABLET | Freq: Every day | ORAL | 0 refills | Status: DC

## 2023-06-01 MED ORDER — IPRATROPIUM BROMIDE 0.06 % NA SOLN: 2.0000 | Freq: Four times a day (QID) | NASAL | 0 refills | Status: DC

## 2023-06-01 NOTE — Discharge Instructions (Addendum)
-  Our strep test is negative.  As discussed you likely have a viral illness which can take a couple weeks to resolve.  Increase rest and fluids and continue with plain Mucinex, nasal saline, Tylenol.  I also sent in different nasal spray for you. - If not feeling better in a couple more days you may take the azithromycin but make sure you finish out the penicillin.  Together both of these antibiotics to treat pneumonia so hopefully noticed an improvement in a couple days but as long as symptoms are not worsening, give it some more time because as discussed it is likely viral and can take a couple weeks. -Use inhaler as needed. - If you develop a fever, worsening pain or shortness of breath return for reevaluation.

## 2023-06-01 NOTE — ED Provider Notes (Signed)
 [facility]    CSN: [id number] Arrival date & time: 06/01/23  8295      History   Chief Complaint Chief Complaint  Patient presents with   Cough   Otalgia   Sore Throat   Mouth Lesions    HPI Allison Black is a 28 y.o. female who is currently [redacted] weeks pregnant.  Medical history significant for asthma, bipolar disorder, and anxiety.  Patient presents today for 5-day history of illness.  Reports sore throat, burning sensation in nose, throat and ears.,  Also reporting ear pain/pressure, nasal congestion, productive cough.  Denies fever, chest pain, shortness of breath or wheezing.  Reports her daughter had strep couple weeks ago.  States her husband was just in the ER for strep and also diagnosed with pneumonia.  Patient seen by OB/GYN 4 days ago.  Reports she has been on penicillin VK for the past 4 days and notes no difference at all.  Has also been taking Mucinex, Tylenol and using over-the-counter nasal spray without relief.  Patient says she does not know what else to do and does not feel like she is getting better.  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 03/10/2023   Supervision of other normal pregnancy, antepartum 11/24/2022   Bipolar disease during pregnancy, antepartum (HCC) 08/15/2021   Candidal vaginitis 08/08/2021   High risk medication use 06/13/2021   Acute cystitis with hematuria 05/06/2021   At risk for prolonged QT interval syndrome 04/23/2021   Noncompliance with  treatment plan 04/23/2021   Bipolar and related disorder (HCC) 03/27/2021   GAD (generalized anxiety disorder) 03/27/2021   Cannabis use disorder, moderate, in early remission, in controlled environment, dependence (HCC) 03/27/2021   Asthma 02/10/2017   Need for immunization against influenza 01/27/2017   Episodic lightheadedness 12/19/2016   Depression with anxiety 10/09/2016    Past Surgical History:  Procedure Laterality Date   NO PAST SURGERIES      OB History     Gravida  5   Para  2   Term  2   Preterm      AB  2   Living  2      SAB  2   IAB      Ectopic      Multiple  0   Live Births  2            Home Medications    Prior to Admission medications   Medication Sig Start Date End Date Taking? Authorizing Provider  azithromycin (ZITHROMAX) 250 MG tablet Take 1 tablet (250 mg total) by mouth daily. Take first 2 tablets together, then 1 every day until finished. 06/01/23  Yes Eusebio Friendly B, PA-C  ipratropium (ATROVENT) 0.06 % nasal spray Place 2 sprays into both nostrils 4 (four) times daily. 06/01/23  Yes Eusebio Friendly B, PA-C  penicillin v potassium (VEETID) 500 MG tablet Take 1 tablet (500 mg total) by mouth 2 (two)  times daily for 10 days. 05/28/23 06/07/23 Yes Dominica Severin, CNM  acetaminophen (TYLENOL) 160 MG chewable tablet Chew 160 mg by mouth as needed for pain.    [provider]  albuterol (VENTOLIN HFA) 108 (90 Base) MCG/ACT inhaler Inhale 1-2 puffs into the lungs every 6 (six) hours as needed for wheezing or shortness of breath. 12/30/22   Shirlee Latch, PA-C  diphenhydrAMINE (BENADRYL) 25 mg capsule Take 25 mg by mouth as needed.   Yes [provider]  Prenatal Vit-Fe Fumarate-FA (PRENATAL PO) Take by mouth.    [provider]  promethazine (PHENERGAN) 12.5 MG tablet Take 1 tablet (12.5 mg total) by mouth every 6 (six) hours as needed for nausea or vomiting. 04/04/23   Mirna Mires, CNM    Family  History Family History  Problem Relation Age of Onset   Bipolar disorder Mother    Lupus Mother    Healthy Father    Mental illness Sister    Asperger's syndrome Sister    Depression Sister    ADD / ADHD Sister    Healthy Brother    Autism Brother    Anxiety disorder Brother    Diabetes Maternal Grandmother    Healthy Maternal Grandfather    Parkinson's disease Paternal Grandmother    Heart failure Paternal Grandmother    Heart Problems Paternal Grandfather    Bipolar disorder Maternal Aunt    Ovarian cancer Maternal Aunt    Bipolar disorder Maternal Aunt    Bipolar disorder Maternal Aunt    Schizophrenia Maternal Aunt    Suicidality Maternal Aunt    Diabetes Cousin     Social History Social History   Tobacco Use   Smoking status: Former    Types: Cigarettes   Smokeless tobacco: Never  Vaping Use   Vaping status: Former  Substance Use Topics   Alcohol use: Not Currently    Alcohol/week: 1.0 standard drink of alcohol    Types: 1 Shots of liquor per week    Comment: 1-2 shots a few times a month   Drug use: Not Currently    Frequency: 28.0 times per week    Types: Marijuana    Comment: stoped with 2024 preg     Allergies   Patient has no known allergies.   Review of Systems Review of Systems  Constitutional:  Positive for fatigue. Negative for chills, diaphoresis and fever.  HENT:  Positive for congestion, ear pain, mouth sores, rhinorrhea and sore throat. Negative for sinus pressure and sinus pain.   Respiratory:  Positive for cough. Negative for shortness of breath.   Cardiovascular:  Negative for chest pain.  Gastrointestinal:  Negative for abdominal pain, nausea and vomiting.  Musculoskeletal:  Negative for arthralgias and myalgias.  Skin:  Negative for rash.  Neurological:  Negative for weakness and headaches.  Hematological:  Negative for adenopathy.  Psychiatric/Behavioral:  Positive for sleep disturbance.      Physical Exam Triage Vital  Signs ED Triage Vitals  Encounter Vitals Group     BP      Systolic BP Percentile      Diastolic BP Percentile      Pulse      Resp      Temp      Temp src      SpO2      Weight      Height      Head Circumference      Peak Flow  Pain Score      Pain Loc      Pain Education      Exclude from Growth Chart    No data found.  Updated Vital Signs BP 112/86 (BP Location: Left Arm)   Pulse (!) 110   Temp 98.2 F (36.8 C) (Oral)   Resp 19   LMP 10/10/2022 (Exact Date)   SpO2 99%    Physical Exam Vitals and nursing note reviewed.  Constitutional:      General: She is not in acute distress.    Appearance: Normal appearance. She is not ill-appearing or toxic-appearing.  HENT:     Head: Normocephalic and atraumatic.     Right Ear: Tympanic membrane, ear canal and external ear normal.     Left Ear: Tympanic membrane, ear canal and external ear normal.     Nose: Congestion present.     Mouth/Throat:     Mouth: Mucous membranes are moist.     Pharynx: Oropharynx is clear. Posterior oropharyngeal erythema present.     Comments: Large aphthous ulcer right inner cheek Eyes:     General: No scleral icterus.       Right eye: No discharge.        Left eye: No discharge.     Conjunctiva/sclera: Conjunctivae normal.  Cardiovascular:     Rate and Rhythm: Regular rhythm. Tachycardia present.     Heart sounds: Normal heart sounds.  Pulmonary:     Effort: Pulmonary effort is normal. No respiratory distress.     Breath sounds: Normal breath sounds.  Musculoskeletal:     Cervical back: Neck supple.  Skin:    General: Skin is dry.  Neurological:     General: No focal deficit present.     Mental Status: She is alert. Mental status is at baseline.     Motor: No weakness.     Gait: Gait normal.  Psychiatric:        Mood and Affect: Mood normal.        Behavior: Behavior normal.      UC Treatments / Results  Labs (all labs ordered are listed, but only abnormal results  are displayed) Labs Reviewed  GROUP A STREP BY PCR    EKG   Radiology No results found.  Procedures Procedures (including critical care time)  Medications Ordered in UC Medications - No data to display  Initial Impression / Assessment and Plan / UC Course  I have reviewed the triage vital signs and the nursing notes.  Pertinent labs & imaging results that were available during my care of the patient were reviewed by me and considered in my medical decision making (see chart for details).   28 year old female presents for fatigue, cough, congestion, sore throat, bilateral ear pain and nasal congestion x 5 days.  Also has a sore inside the right cheek.  Has been exposed to strep by husband and child as well as exposed to possible pneumonia by her husband.  Has been on penicillin VK x 4 days without relief.  Also taking Mucinex, Tylenol and using nasal spray.  She is afebrile.  Overall well-appearing.  No acute distress.  On exam has nasal congestion, erythema posterior pharynx, large aphthous ulcer inside right cheek.  No evidence of ear infection.  Chest clear.  Heart regular rhythm but tachycardic.  PCR strep test performed.  Negative.  Reviewed result patient.  Explained to patient that her symptoms are most consistent with viral illness but she has concerns about  possible pneumonia.  Will hold off on imaging at this time as she is pregnant and lungs are clear.  However, since she does have history of asthma and has been exposed to pneumonia we will cover for possible pneumonia with adding azithromycin to the penicillin VK that she is taking.  Also sent Atrovent nasal spray and advised to continue Mucinex, rest and fluids.  Explained that she may be ill for another week or 2.  Advised her to use inhaler as needed.  Advised to return if fever or worsening symptoms.   Final Clinical Impressions(s) / UC Diagnoses   Final diagnoses:  Lower resp. tract infection  Productive cough   Sore throat  Nasal congestion  [redacted] weeks gestation of pregnancy  Uncomplicated asthma, unspecified asthma severity, unspecified whether persistent     Discharge Instructions      -Our strep test is negative.  As discussed you likely have a viral illness which can take a couple weeks to resolve.  Increase rest and fluids and continue with plain Mucinex, nasal saline, Tylenol.  I also sent in different nasal spray for you. - If not feeling better in a couple more days you may take the azithromycin but make sure you finish out the penicillin.  Together both of these antibiotics to treat pneumonia so hopefully noticed an improvement in a couple days but as long as symptoms are not worsening, give it some more time because as discussed it is likely viral and can take a couple weeks. -Use inhaler as needed. - If you develop a fever, worsening pain or shortness of breath return for reevaluation.     ED Prescriptions     Medication Sig Dispense Auth. Provider   azithromycin (ZITHROMAX) 250 MG tablet Take 1 tablet (250 mg total) by mouth daily. Take first 2 tablets together, then 1 every day until finished. 6 tablet Eusebio Friendly B, PA-C   ipratropium (ATROVENT) 0.06 % nasal spray Place 2 sprays into both nostrils 4 (four) times daily. 15 mL Shirlee Latch, PA-C      PDMP not reviewed this encounter.   Shirlee Latch, PA-C 06/01/23 [phone]

## 2023-06-01 NOTE — ED Triage Notes (Addendum)
 Sx since Wednesday  Sore throat- patient was treated for trep by OBGYN with Veetid . She was told to come here if the medication wasn't working. Patient wasn't swabbed just treated. Patient states that sore throat isnt getting any better. She's having bilateral ear pain-chest congestion and cough.  She's been taking tylenol-benadryl and using inhaler. Patient also has sores in her mouth that have been there since Thursday

## 2023-06-10 ENCOUNTER — Encounter: Payer: Self-pay | Admitting: Certified Nurse Midwife

## 2023-06-10 ENCOUNTER — Ambulatory Visit: Payer: Medicaid Other | Admitting: Certified Nurse Midwife

## 2023-06-10 VITALS — BP 95/56 | HR 84 | Wt 184.8 lb

## 2023-06-10 DIAGNOSIS — Z3A34 34 weeks gestation of pregnancy: Secondary | ICD-10-CM

## 2023-06-10 DIAGNOSIS — Z3480 Encounter for supervision of other normal pregnancy, unspecified trimester: Secondary | ICD-10-CM

## 2023-06-10 DIAGNOSIS — O2603 Excessive weight gain in pregnancy, third trimester: Secondary | ICD-10-CM | POA: Insufficient documentation

## 2023-06-10 NOTE — Progress Notes (Signed)
    Return Prenatal Note   Subjective   28 y.o. [id number] at [redacted]w[redacted]d presents for this follow-up prenatal visit.  Patient feeling much better, received azithromycin at urgent care for presumed CAP, continued congestion but no fevers/chills. Patient reports: Movement: Present Contractions: Irritability  Objective   Flow sheet Vitals: Pulse Rate: 84 BP: (!) 95/56 Fundal Height: 35 cm Fetal Heart Rate (bpm): 150 Presentation: Vertex (Leopolds) Total weight gain: 62 lb 12.8 oz (28.5 kg)  General Appearance  No acute distress, well appearing, and well nourished Pulmonary   Normal work of breathing Neurologic   Alert and oriented to person, place, and time Psychiatric   Mood and affect within normal limits  Assessment/Plan   Plan  28 y.o. [id number] at [redacted]w[redacted]d presents for follow-up OB visit. Reviewed prenatal record including previous visit note.  Supervision of other normal pregnancy, antepartum Reviewed kick counts and preterm labor warning signs. Instructed to call office or come to hospital with persistent headache, vision changes, regular contractions, leaking of fluid, decreased fetal movement or vaginal bleeding. Prepared for 36w labs at next visit.  Excessive weight gain during pregnancy in third trimester Increased activity encouraged, recommend increased water intake, avoid sugary drinks. Reports hx of gaining this much each pregnancy.      No orders of the defined types were placed in this encounter.  Return in 2 weeks (on 06/24/2023) for ROB, GBS/36w labs.   No future appointments.  For next visit:  ROB with GBS screening      Dominica Severin, CNM  03/05/259:19 AM

## 2023-06-10 NOTE — Assessment & Plan Note (Signed)
 Reviewed kick counts and preterm labor warning signs. Instructed to call office or come to hospital with persistent headache, vision changes, regular contractions, leaking of fluid, decreased fetal movement or vaginal bleeding. Prepared for 36w labs at next visit.

## 2023-06-10 NOTE — Patient Instructions (Signed)
 Third Trimester of Pregnancy  The third trimester of pregnancy is from week 28 through week 40. This is months 7 through 9. The third trimester is a time when your baby is growing fast. Body changes during your third trimester Your body continues to change during this time. The changes usually go away after your baby is born. Physical changes You will continue to gain weight. You may get stretch marks on your hips, belly, and breasts. Your breasts will keep growing and may hurt. A yellow fluid (colostrum) may leak from your breasts. This is the first milk you're making for your baby. Your hair may grow faster and get thicker. In some cases, you may get hair loss. Your belly button may stick out. You may have more swelling in your hands, face, or ankles. Health changes You may have heartburn. You may feel short of breath. This is caused by the uterus that is now bigger. You may have more aches in the pelvis, back, or thighs. You may have more tingling or numbness in your hands, arms, and legs. You may pee more often. You may have trouble pooping (constipation) or swollen veins in the butt that can itch or get painful (hemorrhoids). Other changes You may have more problems sleeping. You may notice the baby moving lower in your belly (dropping). You may have more fluid coming from your vagina. Your joints may feel loose, and you may have pain around your pelvic bone. Follow these instructions at home: Medicines Take medicines only as told by your health care provider. Some medicines are not safe during pregnancy. Your provider may change the medicines that you take. Do not take any medicines unless told to by your provider. Take a prenatal vitamin that has at least 600 micrograms (mcg) of folic acid. Do not use herbal medicines, illegal drugs, or medicines that are not approved by your provider. Eating and drinking While you're pregnant your body needs additional nutrition to help  support your growing baby. Talk with your provider about your nutritional needs. Activity Most women are able to exercise regularly during pregnancy. Exercise routines may need to change at the end of your pregnancy. Talk to your provider about your activities and exercise routine. Relieving pain and discomfort Rest often with your legs raised if you have leg cramps or low back pain. Take warm sitz baths to soothe pain from hemorrhoids. Use hemorrhoid cream if your provider says it's okay. Wear a good, supportive bra if your breasts hurt. Do not use hot tubs, steam rooms, or saunas. Do not douche. Do not use tampons or scented pads. Safety Talk to your provider before traveling far distances. Wear your seatbelt at all times when you're in a car. Talk to your provider if someone hits you, hurts you, or yells at you. Preparing for birth To prepare for your baby: Take childbirth and breastfeeding classes. Visit the hospital and tour the maternity area. Buy a rear-facing car seat. Learn how to install it in your car. General instructions Avoid cat litter boxes and soil used by cats. These things carry germs that can cause harm to your pregnancy and your baby. Do not drink alcohol, smoke, vape, or use products with nicotine or tobacco in them. If you need help quitting, talk with your provider. Keep all follow-up visits for your third trimester. Your provider will do more exams and tests during this trimester. Write down your questions. Take them to your prenatal visits. Your provider also will: Talk with you about  your overall health. Give you advice or refer you to specialists who can help with different needs, including: Mental health and counseling. Foods and healthy eating. Ask for help if you need help with food. Where to find more information American Pregnancy Association: americanpregnancy.org [employer]ians and [employer]necologists: acog.org Office on [employer] Health:  TravelLesson.ca Contact a health care provider if: You have a headache that does not go away when you take medicine. You have any of these problems: You can't eat or drink. You have nausea and vomiting. You have watery poop (diarrhea) for 2 days or more. You have pain when you pee, or your pee smells bad. You have been sick for 2 days or more and aren't getting better. Contact your provider right away if: You have any of these coming from your vagina: Abnormal discharge. Bad-smelling fluid. Bleeding. Your baby is moving less than usual. You have signs of labor: You have any contractions, belly cramping, or have pain in your pelvis or lower back before 37 weeks of pregnancy (preterm labor). You have regular contractions that are less than 5 minutes apart. Your water breaks. You have symptoms of high blood pressure or preeclampsia. These include: A severe, throbbing headache that does not go away. Sudden or extreme swelling of your face, hands, legs, or feet. Vision problems: You see spots. You have blurry vision. Your eyes are sensitive to light. If you can't reach your provider, go to an urgent care or emergency room. Get help right away if: You faint, become confused, or can't think clearly. You have chest pain or trouble breathing. You have any kind of injury, such as from a fall or a car crash. These symptoms may be an emergency. Call 911 right away. Do not wait to see if the symptoms will go away. Do not drive yourself to the hospital. This information is not intended to replace advice given to you by your health care provider. Make sure you discuss any questions you have with your health care provider. Document Revised: 12/25/2022 Document Reviewed: 07/25/2022 Elsevier Patient Education  2024 [employer].

## 2023-06-10 NOTE — Assessment & Plan Note (Signed)
 Increased activity encouraged, recommend increased water intake, avoid sugary drinks. Reports hx of gaining this much each pregnancy.

## 2023-06-24 ENCOUNTER — Other Ambulatory Visit (HOSPITAL_COMMUNITY)
Admission: RE | Admit: 2023-06-24 | Discharge: 2023-06-24 | Disposition: A | Discharge status: Home / Self Care | Source: Ambulatory Visit | Attending: Certified Nurse Midwife | Admitting: Certified Nurse Midwife

## 2023-06-24 ENCOUNTER — Ambulatory Visit (INDEPENDENT_AMBULATORY_CARE_PROVIDER_SITE_OTHER): Admitting: Certified Nurse Midwife

## 2023-06-24 ENCOUNTER — Encounter: Payer: Self-pay | Admitting: Certified Nurse Midwife

## 2023-06-24 VITALS — BP 101/58 | HR 104 | Wt 192.0 lb

## 2023-06-24 DIAGNOSIS — Z3685 Encounter for antenatal screening for Streptococcus B: Secondary | ICD-10-CM | POA: Diagnosis not present

## 2023-06-24 DIAGNOSIS — Z3A36 36 weeks gestation of pregnancy: Secondary | ICD-10-CM

## 2023-06-24 DIAGNOSIS — Z3480 Encounter for supervision of other normal pregnancy, unspecified trimester: Secondary | ICD-10-CM | POA: Insufficient documentation

## 2023-06-24 DIAGNOSIS — Z113 Encounter for screening for infections with a predominantly sexual mode of transmission: Secondary | ICD-10-CM | POA: Insufficient documentation

## 2023-06-24 DIAGNOSIS — R42 Dizziness and giddiness: Secondary | ICD-10-CM | POA: Diagnosis not present

## 2023-06-24 NOTE — Patient Instructions (Signed)
 Third Trimester of Pregnancy  The third trimester of pregnancy is from week 28 through week 40. This is months 7 through 9. The third trimester is a time when your baby is growing fast. Body changes during your third trimester Your body continues to change during this time. The changes usually go away after your baby is born. Physical changes You will continue to gain weight. You may get stretch marks on your hips, belly, and breasts. Your breasts will keep growing and may hurt. A yellow fluid (colostrum) may leak from your breasts. This is the first milk you're making for your baby. Your hair may grow faster and get thicker. In some cases, you may get hair loss. Your belly button may stick out. You may have more swelling in your hands, face, or ankles. Health changes You may have heartburn. You may feel short of breath. This is caused by the uterus that is now bigger. You may have more aches in the pelvis, back, or thighs. You may have more tingling or numbness in your hands, arms, and legs. You may pee more often. You may have trouble pooping (constipation) or swollen veins in the butt that can itch or get painful (hemorrhoids). Other changes You may have more problems sleeping. You may notice the baby moving lower in your belly (dropping). You may have more fluid coming from your vagina. Your joints may feel loose, and you may have pain around your pelvic bone. Follow these instructions at home: Medicines Take medicines only as told by your health care provider. Some medicines are not safe during pregnancy. Your provider may change the medicines that you take. Do not take any medicines unless told to by your provider. Take a prenatal vitamin that has at least 600 micrograms (mcg) of folic acid. Do not use herbal medicines, illegal drugs, or medicines that are not approved by your provider. Eating and drinking While you're pregnant your body needs additional nutrition to help  support your growing baby. Talk with your provider about your nutritional needs. Activity Most women are able to exercise regularly during pregnancy. Exercise routines may need to change at the end of your pregnancy. Talk to your provider about your activities and exercise routine. Relieving pain and discomfort Rest often with your legs raised if you have leg cramps or low back pain. Take warm sitz baths to soothe pain from hemorrhoids. Use hemorrhoid cream if your provider says it's okay. Wear a good, supportive bra if your breasts hurt. Do not use hot tubs, steam rooms, or saunas. Do not douche. Do not use tampons or scented pads. Safety Talk to your provider before traveling far distances. Wear your seatbelt at all times when you're in a car. Talk to your provider if someone hits you, hurts you, or yells at you. Preparing for birth To prepare for your baby: Take childbirth and breastfeeding classes. Visit the hospital and tour the maternity area. Buy a rear-facing car seat. Learn how to install it in your car. General instructions Avoid cat litter boxes and soil used by cats. These things carry germs that can cause harm to your pregnancy and your baby. Do not drink alcohol, smoke, vape, or use products with nicotine or tobacco in them. If you need help quitting, talk with your provider. Keep all follow-up visits for your third trimester. Your provider will do more exams and tests during this trimester. Write down your questions. Take them to your prenatal visits. Your provider also will: Talk with you about  your overall health. Give you advice or refer you to specialists who can help with different needs, including: Mental health and counseling. Foods and healthy eating. Ask for help if you need help with food. Where to find more information American Pregnancy Association: americanpregnancy.org [employer]ians and [employer]necologists: acog.org Office on [employer] Health:  TravelLesson.ca Contact a health care provider if: You have a headache that does not go away when you take medicine. You have any of these problems: You can't eat or drink. You have nausea and vomiting. You have watery poop (diarrhea) for 2 days or more. You have pain when you pee, or your pee smells bad. You have been sick for 2 days or more and aren't getting better. Contact your provider right away if: You have any of these coming from your vagina: Abnormal discharge. Bad-smelling fluid. Bleeding. Your baby is moving less than usual. You have signs of labor: You have any contractions, belly cramping, or have pain in your pelvis or lower back before 37 weeks of pregnancy (preterm labor). You have regular contractions that are less than 5 minutes apart. Your water breaks. You have symptoms of high blood pressure or preeclampsia. These include: A severe, throbbing headache that does not go away. Sudden or extreme swelling of your face, hands, legs, or feet. Vision problems: You see spots. You have blurry vision. Your eyes are sensitive to light. If you can't reach your provider, go to an urgent care or emergency room. Get help right away if: You faint, become confused, or can't think clearly. You have chest pain or trouble breathing. You have any kind of injury, such as from a fall or a car crash. These symptoms may be an emergency. Call 911 right away. Do not wait to see if the symptoms will go away. Do not drive yourself to the hospital. This information is not intended to replace advice given to you by your health care provider. Make sure you discuss any questions you have with your health care provider. Document Revised: 12/25/2022 Document Reviewed: 07/25/2022 Elsevier Patient Education  2024 [employer].

## 2023-06-24 NOTE — Assessment & Plan Note (Signed)
 Reviewed kick counts and preterm labor warning signs. Instructed to call office or come to hospital with persistent headache, vision changes, regular contractions, leaking of fluid, decreased fetal movement or vaginal bleeding.

## 2023-06-24 NOTE — Progress Notes (Signed)
    Return Prenatal Note   Subjective   28 y.o. G4W1027 at [redacted]w[redacted]d presents for this follow-up prenatal visit.  Patient feeling more uncomfortable, resting more at home. Feeling pain in lower abdomen & legs with activity. Patient reports: Movement: Present Contractions: Regular  Objective   Flow sheet Vitals: Pulse Rate: (!) 104 BP: (!) 101/58 Fundal Height: 37 cm Fetal Heart Rate (bpm): 160 Presentation: Vertex (Leopolds) Total weight gain: 70 lb (31.8 kg)  General Appearance  No acute distress, well appearing, and well nourished Pulmonary   Normal work of breathing Neurologic   Alert and oriented to person, place, and time Psychiatric   Mood and affect within normal limits  Assessment/Plan   Plan  28 y.o. [id number] at [redacted]w[redacted]d presents for follow-up OB visit. Reviewed prenatal record including previous visit note.  Supervision of other normal pregnancy, antepartum Reviewed kick counts and preterm labor warning signs. Instructed to call office or come to hospital with persistent headache, vision changes, regular contractions, leaking of fluid, decreased fetal movement or vaginal bleeding.    Recommend belly support band, consider swimming as activity if pool available for comfort. GBS/GC/Ct self collected today.    Orders Placed This Encounter  Procedures   Culture, beta strep (group b only)   CBC With Diff/Platelet   Return in 1 week (on 07/01/2023) for ROB.   Future Appointments  Date Time Provider Department Center  07/01/2023 11:15 AM Dominica Severin, CNM [facility] None    For next visit:  continue with routine prenatal care     Dominica Severin, CNM  03/19/251:02 PM

## 2023-06-25 ENCOUNTER — Encounter: Payer: Self-pay | Admitting: Certified Nurse Midwife

## 2023-06-25 LAB — CBC WITH DIFF/PLATELET
Basophils Absolute: 0.1 10*3/uL (ref 0.0–0.2)
Basos: 1 %
EOS (ABSOLUTE): 0.1 10*3/uL (ref 0.0–0.4)
Eos: 1 %
Hematocrit: 32 % — ABNORMAL LOW (ref 34.0–46.6)
Hemoglobin: 10.4 g/dL — ABNORMAL LOW (ref 11.1–15.9)
Immature Grans (Abs): 0.1 10*3/uL (ref 0.0–0.1)
Immature Granulocytes: 1 %
Lymphocytes Absolute: 1.4 10*3/uL (ref 0.7–3.1)
Lymphs: 15 %
MCH: 26.8 pg (ref 26.6–33.0)
MCHC: 32.5 g/dL (ref 31.5–35.7)
MCV: 83 fL (ref 79–97)
Monocytes Absolute: 0.6 10*3/uL (ref 0.1–0.9)
Monocytes: 7 %
Neutrophils Absolute: 7.2 10*3/uL — ABNORMAL HIGH (ref 1.4–7.0)
Neutrophils: 75 %
Platelets: 224 10*3/uL (ref 150–450)
RBC: 3.88 x10E6/uL (ref 3.77–5.28)
RDW: 13.4 % (ref 11.7–15.4)
WBC: 9.4 10*3/uL (ref 3.4–10.8)

## 2023-06-25 LAB — CERVICOVAGINAL ANCILLARY ONLY
Bacterial Vaginitis (gardnerella): NEGATIVE
Candida Glabrata: NEGATIVE
Candida Vaginitis: NEGATIVE
Comment: NEGATIVE
Comment: NEGATIVE
Comment: NEGATIVE

## 2023-06-28 LAB — CULTURE, BETA STREP (GROUP B ONLY): Strep Gp B Culture: NEGATIVE

## 2023-07-01 ENCOUNTER — Ambulatory Visit (INDEPENDENT_AMBULATORY_CARE_PROVIDER_SITE_OTHER): Admitting: Certified Nurse Midwife

## 2023-07-01 VITALS — BP 109/70 | HR 80 | Wt 192.4 lb

## 2023-07-01 DIAGNOSIS — Z3A37 37 weeks gestation of pregnancy: Secondary | ICD-10-CM

## 2023-07-01 DIAGNOSIS — Z3480 Encounter for supervision of other normal pregnancy, unspecified trimester: Secondary | ICD-10-CM | POA: Diagnosis not present
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
## 2023-07-01 NOTE — Patient Instructions (Signed)
 Third Trimester of Pregnancy  The third trimester of pregnancy is from week 28 through week 40. This is months 7 through 9. The third trimester is a time when your baby is growing fast. Body changes during your third trimester Your body continues to change during this time. The changes usually go away after your baby is born. Physical changes You will continue to gain weight. You may get stretch marks on your hips, belly, and breasts. Your breasts will keep growing and may hurt. A yellow fluid (colostrum) may leak from your breasts. This is the first milk you're making for your baby. Your hair may grow faster and get thicker. In some cases, you may get hair loss. Your belly button may stick out. You may have more swelling in your hands, face, or ankles. Health changes You may have heartburn. You may feel short of breath. This is caused by the uterus that is now bigger. You may have more aches in the pelvis, back, or thighs. You may have more tingling or numbness in your hands, arms, and legs. You may pee more often. You may have trouble pooping (constipation) or swollen veins in the butt that can itch or get painful (hemorrhoids). Other changes You may have more problems sleeping. You may notice the baby moving lower in your belly (dropping). You may have more fluid coming from your vagina. Your joints may feel loose, and you may have pain around your pelvic bone. Follow these instructions at home: Medicines Take medicines only as told by your health care provider. Some medicines are not safe during pregnancy. Your provider may change the medicines that you take. Do not take any medicines unless told to by your provider. Take a prenatal vitamin that has at least 600 micrograms (mcg) of folic acid. Do not use herbal medicines, illegal drugs, or medicines that are not approved by your provider. Eating and drinking While you're pregnant your body needs additional nutrition to help  support your growing baby. Talk with your provider about your nutritional needs. Activity Most women are able to exercise regularly during pregnancy. Exercise routines may need to change at the end of your pregnancy. Talk to your provider about your activities and exercise routine. Relieving pain and discomfort Rest often with your legs raised if you have leg cramps or low back pain. Take warm sitz baths to soothe pain from hemorrhoids. Use hemorrhoid cream if your provider says it's okay. Wear a good, supportive bra if your breasts hurt. Do not use hot tubs, steam rooms, or saunas. Do not douche. Do not use tampons or scented pads. Safety Talk to your provider before traveling far distances. Wear your seatbelt at all times when you're in a car. Talk to your provider if someone hits you, hurts you, or yells at you. Preparing for birth To prepare for your baby: Take childbirth and breastfeeding classes. Visit the hospital and tour the maternity area. Buy a rear-facing car seat. Learn how to install it in your car. General instructions Avoid cat litter boxes and soil used by cats. These things carry germs that can cause harm to your pregnancy and your baby. Do not drink alcohol, smoke, vape, or use products with nicotine or tobacco in them. If you need help quitting, talk with your provider. Keep all follow-up visits for your third trimester. Your provider will do more exams and tests during this trimester. Write down your questions. Take them to your prenatal visits. Your provider also will: Talk with you about  your overall health. Give you advice or refer you to specialists who can help with different needs, including: Mental health and counseling. Foods and healthy eating. Ask for help if you need help with food. Where to find more information American Pregnancy Association: americanpregnancy.org [employer]ians and [employer]necologists: acog.org Office on [employer] Health:  TravelLesson.ca Contact a health care provider if: You have a headache that does not go away when you take medicine. You have any of these problems: You can't eat or drink. You have nausea and vomiting. You have watery poop (diarrhea) for 2 days or more. You have pain when you pee, or your pee smells bad. You have been sick for 2 days or more and aren't getting better. Contact your provider right away if: You have any of these coming from your vagina: Abnormal discharge. Bad-smelling fluid. Bleeding. Your baby is moving less than usual. You have signs of labor: You have any contractions, belly cramping, or have pain in your pelvis or lower back before 37 weeks of pregnancy (preterm labor). You have regular contractions that are less than 5 minutes apart. Your water breaks. You have symptoms of high blood pressure or preeclampsia. These include: A severe, throbbing headache that does not go away. Sudden or extreme swelling of your face, hands, legs, or feet. Vision problems: You see spots. You have blurry vision. Your eyes are sensitive to light. If you can't reach your provider, go to an urgent care or emergency room. Get help right away if: You faint, become confused, or can't think clearly. You have chest pain or trouble breathing. You have any kind of injury, such as from a fall or a car crash. These symptoms may be an emergency. Call 911 right away. Do not wait to see if the symptoms will go away. Do not drive yourself to the hospital. This information is not intended to replace advice given to you by your health care provider. Make sure you discuss any questions you have with your health care provider. Document Revised: 12/25/2022 Document Reviewed: 07/25/2022 Elsevier Patient Education  2024 [employer].

## 2023-07-01 NOTE — Assessment & Plan Note (Signed)
 Reviewed kick counts and preterm labor warning signs. Instructed to call office or come to hospital with persistent headache, vision changes, regular contractions, leaking of fluid, decreased fetal movement or vaginal bleeding.

## 2023-07-05 ENCOUNTER — Other Ambulatory Visit: Payer: Self-pay

## 2023-07-05 ENCOUNTER — Observation Stay
Admission: EM | Admit: 2023-07-05 | Discharge: 2023-07-06 | Disposition: A | Discharge status: Home / Self Care | Attending: Licensed Practical Nurse | Admitting: Licensed Practical Nurse

## 2023-07-05 ENCOUNTER — Encounter: Payer: Self-pay | Admitting: Obstetrics and Gynecology

## 2023-07-05 DIAGNOSIS — O26893 Other specified pregnancy related conditions, third trimester: Secondary | ICD-10-CM | POA: Insufficient documentation

## 2023-07-05 DIAGNOSIS — Z3A38 38 weeks gestation of pregnancy: Secondary | ICD-10-CM | POA: Insufficient documentation

## 2023-07-05 DIAGNOSIS — R1030 Lower abdominal pain, unspecified: Secondary | ICD-10-CM | POA: Insufficient documentation

## 2023-07-05 DIAGNOSIS — Z87891 Personal history of nicotine dependence: Secondary | ICD-10-CM | POA: Insufficient documentation

## 2023-07-05 DIAGNOSIS — J45909 Unspecified asthma, uncomplicated: Secondary | ICD-10-CM | POA: Insufficient documentation

## 2023-07-05 DIAGNOSIS — O479 False labor, unspecified: Principal | ICD-10-CM | POA: Diagnosis present

## 2023-07-05 DIAGNOSIS — O471 False labor at or after 37 completed weeks of gestation: Principal | ICD-10-CM | POA: Insufficient documentation

## 2023-07-05 DIAGNOSIS — Z79899 Other long term (current) drug therapy: Secondary | ICD-10-CM | POA: Insufficient documentation

## 2023-07-05 DIAGNOSIS — O99513 Diseases of the respiratory system complicating pregnancy, third trimester: Secondary | ICD-10-CM | POA: Insufficient documentation

## 2023-07-06 DIAGNOSIS — Z3A38 38 weeks gestation of pregnancy: Secondary | ICD-10-CM | POA: Diagnosis not present

## 2023-07-06 DIAGNOSIS — Z79899 Other long term (current) drug therapy: Secondary | ICD-10-CM | POA: Diagnosis not present

## 2023-07-06 DIAGNOSIS — O471 False labor at or after 37 completed weeks of gestation: Secondary | ICD-10-CM | POA: Diagnosis not present

## 2023-07-06 DIAGNOSIS — J45909 Unspecified asthma, uncomplicated: Secondary | ICD-10-CM | POA: Diagnosis not present

## 2023-07-06 DIAGNOSIS — O26893 Other specified pregnancy related conditions, third trimester: Secondary | ICD-10-CM | POA: Diagnosis not present

## 2023-07-06 DIAGNOSIS — O99513 Diseases of the respiratory system complicating pregnancy, third trimester: Secondary | ICD-10-CM | POA: Diagnosis not present

## 2023-07-06 DIAGNOSIS — Z87891 Personal history of nicotine dependence: Secondary | ICD-10-CM | POA: Diagnosis not present

## 2023-07-06 DIAGNOSIS — R1030 Lower abdominal pain, unspecified: Secondary | ICD-10-CM | POA: Diagnosis not present

## 2023-07-06 DIAGNOSIS — O479 False labor, unspecified: Principal | ICD-10-CM | POA: Diagnosis present

## 2023-07-06 NOTE — OB Triage Note (Signed)
 Patient presents to OBS 3 complaining of cxt q10 minutes apart. Patient states that she's not sure when contractions started but she started to feel uncomfortable from them at 1830 this evening. She is rating cxt pain a 9/10, but upon "FACES" assessment RN determines faces score to be a 4/10. Patient denies LOF, bloody show, and recent intercourse. Patient also states she is having some stringy, mucus-like discharge that is pink tinged.

## 2023-07-06 NOTE — Discharge Summary (Signed)
 Physician Final Progress Note  Patient ID: Allison Black MRN: [id number] DOB/AGE: Mar 14, 1996 27 y.o.  Admit date: 07/05/2023 Admitting provider: Linzie Collin, MD Discharge date: 07/06/2023   Admission Diagnoses:  1) intrauterine pregnancy at [redacted]w[redacted]d  2) Contractions 3) Lower abdominal pain  Discharge Diagnoses:  Principal Problem:   Uterine contractions Braxton-Hicks Contractions Pelvic pressure/round ligament pain   History of Present Illness: The patient is a 28 y.o. female [id number] at [redacted]w[redacted]d who presents for contractions. She began having contractions earlier in the evening around 1830 after walking around [place]. She reports she has been having these contractions for a couple of weeks now. She reports having lower abdominal pain that is worse with movement. Allison Black denies LOF, and vaginal bleeding, but she is having some mucous-like discharge that is pink tinged at times. She endorses feeling good fetal movement.   Allison Black expresses worry that she will not know when she is in active labor and miss her opportunity for an epidural-she dilated to 7cm fairly comfortably with her first child, she also worries  her water will break at home and she will not have time to get to the hospital.   Past Medical History:  Diagnosis Date   Asthma    Bipolar disorder (HCC)    Family history of ovarian cancer    5/23 cancer genetic testing letter sent   Heart murmur    Long term current use of cannabis 04/23/2021   Panic attack     Past Surgical History:  Procedure Laterality Date   NO PAST SURGERIES      No current facility-administered medications on file prior to encounter.   Current Outpatient Medications on File Prior to Encounter  Medication Sig Dispense Refill   albuterol (VENTOLIN HFA) 108 (90 Base) MCG/ACT inhaler Inhale 1-2 puffs into the lungs every 6 (six) hours as needed for wheezing or shortness of breath. 1 g 1   diphenhydrAMINE (BENADRYL) 25 mg capsule Take 25 mg by  mouth as needed.     Prenatal Vit-Fe Fumarate-FA (PRENATAL PO) Take by mouth.      No Known Allergies  Social History   Socioeconomic History   Marital status: Married    Spouse name: [name]   Number of children: 2   Years of education: 12   Highest education level: Not on file  Occupational History   Occupation: Scientist, [employer]  Tobacco Use   Smoking status: Former    Types: Cigarettes   Smokeless tobacco: Never  Vaping Use   Vaping status: Former  Substance and Sexual Activity   Alcohol use: Not Currently    Alcohol/week: 1.0 standard drink of alcohol    Types: 1 Shots of liquor per week    Comment: 1-2 shots a few times a month   Drug use: Not Currently    Frequency: 28.0 times per week    Types: Marijuana    Comment: stoped with 2024 preg   Sexual activity: Yes    Partners: Male    Birth control/protection: None  Other Topics Concern   Not on file  Social History Narrative   Son - [name]    Daughter - [name]   Social Drivers of Health   Financial Resource Strain: Low Risk  (11/24/2022)   Overall Financial Resource Strain (CARDIA)    Difficulty of Paying Living Expenses: Not hard at all  Food Insecurity: No Food Insecurity (11/24/2022)   Hunger Vital Sign    Worried About Running Out of Food in the  Last Year: Never true    Ran Out of Food in the Last Year: Never true  Transportation Needs: No Transportation Needs (11/24/2022)   PRAPARE - Administrator, Civil Service (Medical): No    Lack of Transportation (Non-Medical): No  Physical Activity: Inactive (11/24/2022)   Exercise Vital Sign    Days of Exercise per Week: 0 days    Minutes of Exercise per Session: 0 min  Stress: Stress Concern Present (11/24/2022)   [employer] of Occupational Health - Occupational Stress Questionnaire    Feeling of Stress : To some extent  Social Connections: Moderately Isolated (11/24/2022)   Social Connection and Isolation Panel [NHANES]    Frequency  of Communication with Friends and Family: More than three times a week    Frequency of Social Gatherings with Friends and Family: Not on file    Attends Religious Services: Never    Database administrator or Organizations: No    Attends Banker Meetings: Never    Marital Status: Married  Catering manager Violence: Not At Risk (11/24/2022)   Humiliation, Afraid, Rape, and Kick questionnaire    Fear of Current or Ex-Partner: No    Emotionally Abused: No    Physically Abused: No    Sexually Abused: No    Family History  Problem Relation Age of Onset   Bipolar disorder Mother    Lupus Mother    Healthy Father    Mental illness Sister    Asperger's syndrome Sister    Depression Sister    ADD / ADHD Sister    Healthy Brother    Autism Brother    Anxiety disorder Brother    Diabetes Maternal Grandmother    Healthy Maternal Grandfather    Parkinson's disease Paternal Grandmother    Heart failure Paternal Grandmother    Heart Problems Paternal Grandfather    Bipolar disorder Maternal Aunt    Ovarian cancer Maternal Aunt    Bipolar disorder Maternal Aunt    Bipolar disorder Maternal Aunt    Schizophrenia Maternal Aunt    Suicidality Maternal Aunt    Diabetes Cousin      Review of Systems  Musculoskeletal:        Pelvic pain  All other systems reviewed and are negative.    Physical Exam: BP 105/62   Pulse 88   Temp 98 F (36.7 C) (Oral)   Resp 20   LMP 10/10/2022 (Exact Date)   SpO2 100%   Physical Exam Constitutional:      Appearance: Normal appearance.  HENT:     Head: Normocephalic and atraumatic.  Cardiovascular:     Rate and Rhythm: Normal rate.  Pulmonary:     Effort: Pulmonary effort is normal.  Abdominal:     Palpations: Abdomen is soft.     Comments: Gravid  Musculoskeletal:        General: Normal range of motion.     Cervical back: Normal range of motion.  Neurological:     Mental Status: She is alert and oriented to person, place, and  time.  Skin:    General: Skin is warm and dry.  Psychiatric:        Mood and Affect: Mood normal.        Behavior: Behavior normal.    Fetus A Non-Stress Test Interpretation for 07/06/23  Indication:  contractions  Fetal Heart Rate A Mode: External (removed per provider) Baseline Rate (A): 145 bpm Variability: Moderate Accelerations: 15 x 15  Decelerations: None  Uterine Activity Mode: Toco, Palpation (removed per provider) Contraction Frequency (min): 4-6 Contraction Duration (sec): 60-100 Contraction Quality: Mild Resting Tone Palpated: Relaxed Resting Time: Adequate     Consults: None  Significant Findings/ Diagnostic Studies: none  Procedures: NST- reactive  Hospital Course: The patient was admitted to Labor and Delivery Triage for observation. She was placed on EFM for NST which was reactive. Bria, RN performed cervical exam at 2205 and was 1.5/50/-3. We discussed either being discharged home after cervical exam or having a re-check done in 2 hrs. Allison Black elected to be re-checked in 2 hrs and was 2/80/-3 by Allison Klinefelter, RN. We attempted to apply a belly binder to alleviate Allison Black's pelvic pressure and pain, but she removed it as it was rubbing against her skin and causing discomfort. We discussed recommendation to go home to rest as Anndrea is not in labor at this time. She verbalizes understanding. Labor precautions given. She will keep her scheduled ROB.   Discharge Condition: stable  Disposition: Discharge disposition: 01-Home or Self Care       Diet: Regular diet  Discharge Activity: Activity as tolerated   Allergies as of 07/06/2023   No Known Allergies      Medication List     STOP taking these medications    acetaminophen 160 MG chewable tablet Commonly known as: TYLENOL   ipratropium 0.06 % nasal spray Commonly known as: ATROVENT   promethazine 12.5 MG tablet Commonly known as: PHENERGAN       TAKE these medications    albuterol 108 (90  Base) MCG/ACT inhaler Commonly known as: VENTOLIN HFA Inhale 1-2 puffs into the lungs every 6 (six) hours as needed for wheezing or shortness of breath.   diphenhydrAMINE 25 mg capsule Commonly known as: BENADRYL Take 25 mg by mouth as needed.   PRENATAL PO Take by mouth.         Total time spent taking care of this patient: 30 minutes  Signed: Cindra Eves, SNM Allison Black [facility], CNM  07/06/2023, 12:42 AM

## 2023-07-07 ENCOUNTER — Encounter: Payer: Self-pay | Admitting: Obstetrics and Gynecology

## 2023-07-07 ENCOUNTER — Ambulatory Visit (INDEPENDENT_AMBULATORY_CARE_PROVIDER_SITE_OTHER): Admitting: Obstetrics and Gynecology

## 2023-07-07 VITALS — BP 114/64 | HR 79 | Wt 192.0 lb

## 2023-07-07 DIAGNOSIS — Z3483 Encounter for supervision of other normal pregnancy, third trimester: Secondary | ICD-10-CM

## 2023-07-07 DIAGNOSIS — Z3480 Encounter for supervision of other normal pregnancy, unspecified trimester: Secondary | ICD-10-CM

## 2023-07-07 DIAGNOSIS — Z3A38 38 weeks gestation of pregnancy: Secondary | ICD-10-CM

## 2023-07-07 DIAGNOSIS — O09299 Supervision of pregnancy with other poor reproductive or obstetric history, unspecified trimester: Secondary | ICD-10-CM | POA: Insufficient documentation

## 2023-07-07 DIAGNOSIS — O479 False labor, unspecified: Secondary | ICD-10-CM

## 2023-07-07 NOTE — Progress Notes (Signed)
 ROB: Patient is a 28 y.o. [id number] at [redacted]w[redacted]d who presents for routine OB care.  Pregnancy is complicated by  has Depression with anxiety; Episodic lightheadedness; Need for immunization against influenza; Asthma; Bipolar and related disorder (HCC); GAD (generalized anxiety disorder); Cannabis use disorder, moderate, in early remission, in controlled environment, dependence (HCC); At risk for prolonged QT interval syndrome; Noncompliance with treatment plan; Acute cystitis with hematuria; High risk medication use; Candidal vaginitis; Bipolar disease during pregnancy, antepartum (HCC); Supervision of other normal pregnancy, antepartum; History of high cholesterol; Easy bruising; Recurrent epistaxis; 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    Patient has complaints of contractions, occurring ~ every 5-7 minutes. Notes she was seen on L&D over the weekend due to contractions and loss of her mucus plug. Was told she was  2 cm at that time.  Has concerns about going into labor and not being able to make it to the hospital in time if her water breaks. Notes that she has also been feeling more nauseated over the past few days. Has not been eating or drinking much. Took a Benadryl last night to help with nausea and sleep but notes it did not help much. Discussed concerns with patient, reiterated labor precautions.  Encouraged attempting to maintain hydration. Also advised on [employer] and [employer]. Desires membrane sweep next visit if labor has no occurred. RTC in 1 week.

## 2023-07-07 NOTE — Progress Notes (Signed)
 ROB [redacted]w[redacted]d: She reports that she has not been feeling well, she has some nausea and pain. She reports good fetal movement.

## 2023-07-15 ENCOUNTER — Other Ambulatory Visit: Payer: Self-pay

## 2023-07-15 ENCOUNTER — Inpatient Hospital Stay: Admitting: Anesthesiology

## 2023-07-15 ENCOUNTER — Inpatient Hospital Stay
Admission: EM | Admit: 2023-07-15 | Discharge: 2023-07-17 | DRG: 807 | Disposition: A | Discharge status: Home / Self Care | Attending: Certified Nurse Midwife | Admitting: Certified Nurse Midwife

## 2023-07-15 ENCOUNTER — Encounter: Payer: Self-pay | Admitting: Obstetrics and Gynecology

## 2023-07-15 ENCOUNTER — Ambulatory Visit (INDEPENDENT_AMBULATORY_CARE_PROVIDER_SITE_OTHER): Admitting: Certified Nurse Midwife

## 2023-07-15 ENCOUNTER — Encounter: Payer: Self-pay | Admitting: Certified Nurse Midwife

## 2023-07-15 VITALS — BP 125/76 | HR 92 | Wt 194.7 lb

## 2023-07-15 DIAGNOSIS — Z3483 Encounter for supervision of other normal pregnancy, third trimester: Secondary | ICD-10-CM | POA: Diagnosis not present

## 2023-07-15 DIAGNOSIS — F319 Bipolar disorder, unspecified: Secondary | ICD-10-CM

## 2023-07-15 DIAGNOSIS — O99214 Obesity complicating childbirth: Secondary | ICD-10-CM | POA: Diagnosis not present

## 2023-07-15 DIAGNOSIS — O4202 Full-term premature rupture of membranes, onset of labor within 24 hours of rupture: Secondary | ICD-10-CM

## 2023-07-15 DIAGNOSIS — Z3A39 39 weeks gestation of pregnancy: Secondary | ICD-10-CM

## 2023-07-15 DIAGNOSIS — O99344 Other mental disorders complicating childbirth: Secondary | ICD-10-CM

## 2023-07-15 DIAGNOSIS — Z87891 Personal history of nicotine dependence: Secondary | ICD-10-CM

## 2023-07-15 DIAGNOSIS — F419 Anxiety disorder, unspecified: Secondary | ICD-10-CM | POA: Diagnosis not present

## 2023-07-15 DIAGNOSIS — Z8249 Family history of ischemic heart disease and other diseases of the circulatory system: Secondary | ICD-10-CM

## 2023-07-15 DIAGNOSIS — Z3480 Encounter for supervision of other normal pregnancy, unspecified trimester: Principal | ICD-10-CM

## 2023-07-15 DIAGNOSIS — O4292 Full-term premature rupture of membranes, unspecified as to length of time between rupture and onset of labor: Secondary | ICD-10-CM | POA: Diagnosis not present

## 2023-07-15 LAB — WET PREP, GENITAL
Clue Cells Wet Prep HPF POC: NONE SEEN
Sperm: NONE SEEN
Trich, Wet Prep: NONE SEEN
WBC, Wet Prep HPF POC: <10 (ref <10)
Yeast Wet Prep HPF POC: NONE SEEN

## 2023-07-15 LAB — CBC
HCT: 34.9 % — ABNORMAL LOW (ref 36.0–46.0)
Hemoglobin: 11.2 g/dL — ABNORMAL LOW (ref 12.0–15.0)
MCH: 25.9 pg — ABNORMAL LOW (ref 26.0–34.0)
MCHC: 32.1 g/dL (ref 30.0–36.0)
MCV: 80.8 fL (ref 80.0–100.0)
Platelets: 227 10*3/uL (ref 150–400)
RBC: 4.32 MIL/uL (ref 3.87–5.11)
RDW: 15 % (ref 11.5–15.5)
WBC: 11.4 10*3/uL — ABNORMAL HIGH (ref 4.0–10.5)
nRBC: 0 % (ref 0.0–0.2)

## 2023-07-15 LAB — RUPTURE OF MEMBRANE (ROM)PLUS: Rom Plus: POSITIVE

## 2023-07-15 LAB — URINE DRUG SCREEN, QUALITATIVE (ARMC ONLY)
Amphetamines, Ur Screen: NOT DETECTED
Barbiturates, Ur Screen: NOT DETECTED
Benzodiazepine, Ur Scrn: NOT DETECTED
Cannabinoid 50 Ng, Ur ~~LOC~~: NOT DETECTED
Cocaine Metabolite,Ur ~~LOC~~: NOT DETECTED
MDMA (Ecstasy)Ur Screen: NOT DETECTED
Methadone Scn, Ur: NOT DETECTED
Opiate, Ur Screen: NOT DETECTED
Phencyclidine (PCP) Ur S: NOT DETECTED
Tricyclic, Ur Screen: NOT DETECTED

## 2023-07-15 LAB — TYPE AND SCREEN
ABO/RH(D): B POS
Antibody Screen: NEGATIVE

## 2023-07-15 MED ORDER — CALCIUM CARBONATE ANTACID 500 MG PO CHEW
CHEWABLE_TABLET | ORAL | Status: AC
  Filled 2023-07-15: qty 2

## 2023-07-15 MED ORDER — SOD CITRATE-CITRIC ACID 500-334 MG/5ML PO SOLN: 30.0000 mL | ORAL | Status: DC | PRN

## 2023-07-15 MED ORDER — CALCIUM CARBONATE ANTACID 500 MG PO CHEW
2.0000 | CHEWABLE_TABLET | Freq: Three times a day (TID) | ORAL | Status: DC
  Administered 2023-07-15: 400 mg via ORAL

## 2023-07-15 MED ORDER — TERBUTALINE SULFATE 1 MG/ML IJ SOLN: 0.2500 mg | Freq: Once | INTRAMUSCULAR | Status: DC | PRN

## 2023-07-15 MED ORDER — ONDANSETRON HCL 4 MG/2ML IJ SOLN
4.0000 mg | Freq: Four times a day (QID) | INTRAMUSCULAR | Status: DC | PRN
  Administered 2023-07-15: 4 mg via INTRAVENOUS
  Filled 2023-07-15: qty 2

## 2023-07-15 MED ORDER — LACTATED RINGERS IV SOLN
INTRAVENOUS | Status: DC
  Administered 2023-07-15: 1000 mL via INTRAVENOUS

## 2023-07-15 MED ORDER — LIDOCAINE HCL (PF) 1 % IJ SOLN: 30.0000 mL | INTRAMUSCULAR | Status: DC | PRN

## 2023-07-15 MED ORDER — DIPHENHYDRAMINE HCL 50 MG/ML IJ SOLN: 12.5000 mg | INTRAMUSCULAR | Status: DC | PRN

## 2023-07-15 MED ORDER — EPHEDRINE 5 MG/ML INJ: 10.0000 mg | INTRAVENOUS | Status: DC | PRN

## 2023-07-15 MED ORDER — BUPIVACAINE HCL (PF) 0.25 % IJ SOLN
INTRAMUSCULAR | Status: DC | PRN
  Administered 2023-07-15: 8 mL via EPIDURAL

## 2023-07-15 MED ORDER — LACTATED RINGERS IV SOLN
500.0000 mL | Freq: Once | INTRAVENOUS | Status: AC
  Administered 2023-07-15: 500 mL via INTRAVENOUS

## 2023-07-15 MED ORDER — LACTATED RINGERS IV SOLN
500.0000 mL | INTRAVENOUS | Status: DC | PRN
  Administered 2023-07-15: 500 mL via INTRAVENOUS

## 2023-07-15 MED ORDER — OXYTOCIN BOLUS FROM INFUSION: 333.0000 mL | Freq: Once | INTRAVENOUS | Status: DC

## 2023-07-15 MED ORDER — OXYCODONE-ACETAMINOPHEN 5-325 MG PO TABS: 2.0000 | ORAL_TABLET | ORAL | Status: DC | PRN

## 2023-07-15 MED ORDER — PHENYLEPHRINE 80 MCG/ML (10ML) SYRINGE FOR IV PUSH (FOR BLOOD PRESSURE SUPPORT): 80.0000 ug | PREFILLED_SYRINGE | INTRAVENOUS | Status: DC | PRN

## 2023-07-15 MED ORDER — OXYTOCIN 10 UNIT/ML IJ SOLN: 10.0000 [IU] | Freq: Once | INTRAMUSCULAR | Status: DC

## 2023-07-15 MED ORDER — OXYTOCIN-SODIUM CHLORIDE 30-0.9 UT/500ML-% IV SOLN
2.5000 [IU]/h | INTRAVENOUS | Status: DC
  Filled 2023-07-15: qty 500

## 2023-07-15 MED ORDER — FENTANYL-BUPIVACAINE-NACL 0.5-0.125-0.9 MG/250ML-% EP SOLN
12.0000 mL/h | EPIDURAL | Status: DC | PRN
  Administered 2023-07-15: 12 mL/h via EPIDURAL

## 2023-07-15 MED ORDER — OXYCODONE-ACETAMINOPHEN 5-325 MG PO TABS: 1.0000 | ORAL_TABLET | ORAL | Status: DC | PRN

## 2023-07-15 MED ORDER — FENTANYL-BUPIVACAINE-NACL 0.5-0.125-0.9 MG/250ML-% EP SOLN
EPIDURAL | Status: AC
  Filled 2023-07-15: qty 250

## 2023-07-15 MED ORDER — LIDOCAINE-EPINEPHRINE (PF) 1.5 %-1:200000 IJ SOLN
INTRAMUSCULAR | Status: DC | PRN
  Administered 2023-07-15: 3 mL via EPIDURAL

## 2023-07-15 MED ORDER — OXYTOCIN-SODIUM CHLORIDE 30-0.9 UT/500ML-% IV SOLN
1.0000 m[IU]/min | INTRAVENOUS | Status: DC
  Administered 2023-07-15: 2 m[IU]/min via INTRAVENOUS

## 2023-07-15 MED ORDER — ACETAMINOPHEN 325 MG PO TABS: 650.0000 mg | ORAL_TABLET | ORAL | Status: DC | PRN

## 2023-07-15 NOTE — Anesthesia Procedure Notes (Signed)
 Epidural Patient location during procedure: OB Start time: 07/15/2023 3:49 PM End time: 07/15/2023 3:57 PM  Staffing Anesthesiologist: Reed Breech, MD Performed: anesthesiologist   Preanesthetic Checklist Completed: patient identified, IV checked, risks and benefits discussed, surgical consent, monitors and equipment checked, pre-op evaluation and timeout performed  Epidural Patient position: sitting Prep: ChloraPrep Patient monitoring: heart rate, continuous pulse ox and blood pressure Approach: midline Location: L2-L3 Injection technique: LOR air  Needle:  Needle type: Tuohy  Needle gauge: 17 G Needle length: 9 cm Needle insertion depth: 5 cm Catheter at skin depth: 10 cm Test dose: negative and 1.5% lidocaine with Epi 1:200 K  Assessment Sensory level: T4  Additional Notes Straightforward placement without apparent complications. Reason for block:procedure for pain

## 2023-07-15 NOTE — Progress Notes (Signed)
 ROB , feeling well. Pt states she woke this morning and her bed was wet. She is unsure of ROM. On exam with speculum , white discharge and thin fluid noted. Nitrazine equivocal. SVE 3/70/-s vertex. Discussed possibility for rupture. Pt encouraged to follow up in L&D for for ROM plus testing. She verbalizes and agrees to plan.

## 2023-07-15 NOTE — Progress Notes (Signed)
 Labor Progress Note   ASSESSMENT/PLAN   Allison Black 28 y.o.   [id number]  at [redacted]w[redacted]d here with augmentation of labor for PROM.  FWB:  - Fetal well being assessed: 1      GBS: - GBS Negative/-- (03/19 0955)    LABOR: - Now in early labor, doing well. - Pain Management: epidural - Discussed options with patient and will rupture fore bag.  - Anticipate SVD   Active Problems:  Bipolar/Anxiety -no meds -Plan to restart medication postpartum Was previously taking Abilify 2 mg, Prozac 40 mg and hydroxyzine 25 mg  -Close postpartum follow up for mood  Labor Progress: 0230 SROM at home 1450  3/70/-2 , pitocin started 1700  4/70/-1, fore bag ruptured   SUBJECTIVE/OBJECTIVE   SUBJECTIVE:   Patient comfortable with epidural  OBJECTIVE: Vital Signs: Patient Vitals for the past 12 hrs:  BP Temp Temp src Pulse Resp SpO2 Height Weight  07/15/23 1855 -- -- -- -- -- 98 % -- --  07/15/23 1845 -- -- -- -- -- 97 % -- --  07/15/23 1840 -- -- -- -- -- 97 % -- --  07/15/23 1835 -- -- -- -- -- 98 % -- --  07/15/23 1830 -- -- -- -- -- 96 % -- --  07/15/23 1816 (!) 103/56 -- -- 62 -- -- -- --  07/15/23 1745 -- -- -- -- -- 97 % -- --  07/15/23 1716 (!) 97/51 -- -- 69 -- -- -- --  07/15/23 1715 -- -- -- -- -- 98 % -- --  07/15/23 1714 -- 97.6 F (36.4 C) Axillary -- -- -- -- --  07/15/23 1709 -- -- -- -- -- 98 % -- --  07/15/23 1705 -- -- -- -- -- 95 % -- --  07/15/23 1701 105/63 -- -- 81 -- -- -- --  07/15/23 1700 -- -- -- -- -- 96 % -- --  07/15/23 1655 -- -- -- -- -- 96 % -- --  07/15/23 1646 109/69 -- -- 75 -- -- -- --  07/15/23 1645 -- -- -- -- -- 97 % -- --  07/15/23 1631 111/63 -- -- 77 -- -- -- --  07/15/23 1630 -- -- -- -- -- 98 % -- --  07/15/23 1625 -- -- -- -- -- 98 % -- --  07/15/23 1620 -- -- -- -- -- 97 % -- --  07/15/23 1616 114/61 -- -- 72 -- -- -- --  07/15/23 1615 -- -- -- -- -- 98 % -- --  07/15/23 1611 (!) 120/59 -- -- 72 -- -- -- --  07/15/23 1610 -- -- -- --  -- 96 % -- --  07/15/23 1605 108/61 -- -- 86 -- 97 % -- --  07/15/23 1602 -- 98.6 F (37 C) Axillary -- -- -- -- --  07/15/23 1600 108/66 -- -- 81 -- 98 % -- --  07/15/23 1556 (!) 112/59 -- -- 91 -- -- -- --  07/15/23 1555 -- -- -- -- -- 95 % -- --  07/15/23 1554 116/64 -- -- 89 -- -- -- --  07/15/23 1521 99/66 98.2 F (36.8 C) Oral 94 -- -- -- --  07/15/23 1100 112/68 97.8 F (36.6 C) Oral 85 18 -- 5\' 2"  (1.575 m) 88 kg  07/15/23 1027 120/70 -- -- 83 18 -- -- --  07/15/23 1014 -- -- -- -- -- -- 5\' 2"  (1.575 m) 88 kg  07/15/23 1010 107/72 -- -- 86 -- -- -- --  Last SVE:  Dilation: 4 Effacement (%): 70 Station: -1, 0 Exam by:: Terald Jump CNM -  , Rupture Date: 07/15/23, Rupture Time: 0230,    FHR:   - Mode: External  - Baseline Rate (A): 150 bpm  -  moderate variability  - Accelerations: 15 x 15  -  none  UTERINE ACTIVITY:   - Mode: Toco  - Contraction Frequency (min): 2-3 minutes Pitocin: 4 mU/min

## 2023-07-15 NOTE — Progress Notes (Signed)
 Labor Progress Note   ASSESSMENT/PLAN   Kevia Zaucha 28 y.o.   [id number]  at [redacted]w[redacted]d here with PROM.  FWB:  - Fetal well being assessed: Cat 1      GBS: - GBS Negative/-- (03/19 0955)    LABOR: - Discussed with Valarie that she was likely near 12 hours of ROM without labor. She reports contractions every 5 minutes but is able to talk through. Offered options for augmentation versus continued expectant management. She desires augmentation at this time. Will start pitocin.  - Pain Management: will desire epidural - Anticipate SVD   Active Problems:  Bipolar/Anxiety -no meds -Plan to restart medication postpartum Was previously taking Abilify 2 mg, Prozac 40 mg and hydroxyzine 25 mg  -Close postpartum follow up for mood   SUBJECTIVE/OBJECTIVE   SUBJECTIVE:  Sitting on ball by bed.     OBJECTIVE: Vital Signs: Patient Vitals for the past 12 hrs:  BP Temp Temp src Pulse Resp Height Weight  07/15/23 1100 112/68 97.8 F (36.6 C) Oral 85 18 5\' 2"  (1.575 m) 88 kg  07/15/23 1027 120/70 -- -- 83 18 -- --  07/15/23 1014 -- -- -- -- -- 5\' 2"  (1.575 m) 88 kg  07/15/23 1010 107/72 -- -- 86 -- -- --    Last SVE:  -3/70/-2  FHR:   - Mode: External  - Baseline Rate (A): 150 bpm  -  moderate varibility  -  Accelerations: 15 x 15  -  no decelerations  UTERINE ACTIVITY:   - Mode: Toco  - Contraction Frequency (min): UTD pt on birthing ball minutes

## 2023-07-15 NOTE — OB Triage Note (Signed)
 Pt is a 27yo G5P2, 39w 5d.  She arrived to the unit from her prenatal apt at [facility] for a ROM+. She states that she has had LOF since 0200 that has been a slow leak and she is uncertain if it was her water.  She denies vaginal bleeding, reports positive fetal movement, and reports contractions every that is 8/10 on pain scale.. VS stable, monitors applied and assessing.  Initial FHT 155 at 1100.

## 2023-07-15 NOTE — Anesthesia Preprocedure Evaluation (Addendum)
 Anesthesia Evaluation  Patient identified by MRN, date of birth, ID band Patient awake    Reviewed: Allergy & Precautions, NPO status , Patient's Chart, lab work & pertinent test results  History of Anesthesia Complications Negative for: history of anesthetic complications  Airway Mallampati: III   Neck ROM: Full    Dental   Pulmonary asthma , former smoker   Pulmonary exam normal breath sounds clear to auscultation       Cardiovascular Exercise Tolerance: Good negative cardio ROS Normal cardiovascular exam Rhythm:Regular Rate:Normal     Neuro/Psych   Anxiety Depression Bipolar Disorder   Hx marijuana use    GI/Hepatic negative GI ROS,,,  Endo/Other  Obesity   Renal/GU negative Renal ROS     Musculoskeletal   Abdominal   Peds  Hematology negative hematology ROS (+)   Anesthesia Other Findings 28 yo [id number] at 70 5/7 requesting labor epidural.  Reproductive/Obstetrics                             Anesthesia Physical Anesthesia Plan  ASA: 2  Anesthesia Plan: Epidural   Post-op Pain Management:    Induction:   PONV Risk Score and Plan: 2 and Treatment may vary due to age or medical condition  Airway Management Planned: Natural Airway  Additional Equipment:   Intra-op Plan:   Post-operative Plan:   Informed Consent: I have reviewed the patients History and Physical, chart, labs and discussed the procedure including the risks, benefits and alternatives for the proposed anesthesia with the patient or authorized representative who has indicated his/her understanding and acceptance.     Dental Advisory Given  Plan Discussed with:   Anesthesia Plan Comments: (Patient reports no bleeding problems and no anticoagulant use.   Patient consented for risks of anesthesia including but not limited to:  - adverse reactions to medications - risk of bleeding, infection and or nerve  damage from epidural that could lead to paralysis - risk of headache or failed epidural - nerve damage due to positioning - that if epidural is used for C-section that there is a chance of epidural failure requiring spinal placement or conversion to GA - damage to heart, brain, lungs, other parts of body or loss of life  Patient voiced understanding and assent.)        Anesthesia Quick Evaluation

## 2023-07-15 NOTE — Patient Instructions (Signed)
 Braxton Hicks Contractions: Self-Care Braxton Hicks contractions may feel like labor contractions, but they're like practice for real labor. They can start when you're halfway through your pregnancy. During the last 2 months of your pregnancy, these contractions may happen more and hurt more. However, they aren't a sign that you're in real labor. What are Deberah Pelton contractions? Braxton Hicks contractions make your belly muscles tighten. They aren't real labor because they don't make your cervix, which is the lowest part of your uterus, open and thin. This tightening of your belly before labor starts can also be called false labor. How to tell the difference between true labor and false labor True labor contractions Last 60-90 seconds. Happen on a pattern. Get stronger and last longer over time. Don't go away when you: Walk. Change positions. Rest. Discomfort often begins in the back and then moves to the front. The cervix opens and thins. False labor contractions Are weak and last a short time. Don't happen on a pattern. Happen farther apart than true labor. May go away when you: Walk. Change positions. Rest. May be noticed more at the end of the day. Discomfort is often only felt in the front of the belly. The cervix usually does not open or thin. Sometimes, the only way to tell if you're in true labor is for your health care provider to check your cervix. Your provider will do a physical exam and may monitor your contractions. If you're in true labor, your provider may send you home with instructions about when to return to the hospital or may send you to the hospital right away. Follow these instructions at home:  Take your medicines only as told. Drink more fluids as told. Dehydration may cause these contractions. Dehydration is when there's not enough water in your body. If Braxton Hicks contractions are making you uncomfortable: Change your position or activity. Sit and  rest in a tub of warm water. Do slow and deep breathing several times an hour. Keep all follow-up visits. Your provider will need to check your health and your baby's health. Contact a health care provider if: Your contractions become: Stronger. More regular. Closer together. You have mucus from your vagina that has blood in it. Get help right away if: You feel your baby moving less than usual. You have any amount of fluid that flows from your vagina without stopping. You have signs or symptoms of labor before 37 weeks of pregnancy, such as: Contractions that are 5 minutes or less apart, or that get stronger and last longer. Sudden, sharp pain in your belly or lower back. These symptoms may be an emergency. Call 911 right away. Do not wait to see if the symptoms will go away. Do not drive yourself to the hospital. This information is not intended to replace advice given to you by your health care provider. Make sure you discuss any questions you have with your health care provider. Document Revised: 11/04/2022 Document Reviewed: 11/04/2022 Elsevier Patient Education  2024 [employer].

## 2023-07-15 NOTE — Discharge Summary (Signed)
 Postpartum Discharge Note  ***change DOS***    Patient Name: Allison Black DOB: 04/02/1996 MRN: [id number]                            Discharge Summary  Date of Admission: 07/15/2023 Date of Discharge: 07/15/2023 Delivering Provider: Oley Balm  Admitting Diagnosis: Labor and delivery indication for care or intervention [O75.9] at [redacted]w[redacted]d Secondary diagnosis:  Principal Problem:   Labor and delivery indication for care or intervention   Mode of Delivery: normal spontaneous vaginal delivery              Discharge diagnosis: {DX.:23714}    ***  Intrapartum Procedures: Atificial rupture of membranes, epidural, laceration 1st, and pitocin augmentation   Post partum procedures: {Postpartum procedures:23558}  Complications: {complications:3041412}                     Discharge Day SOAP Note:  Progress Note - Vaginal Delivery  Allison Black is a 28 y.o. [id number] now PP day {Numbers; 0-5:140013} s/p Vaginal, Spontaneous. Delivery was complicated by shoulder dystocia.  Subjective  The patient has the following complaints: {gen pregnancy complaints obgyn:313259}  Pain is controlled with current medications.   Patient is urinating without difficulty.  She is ambulating well.   ***  Objective  Vital signs: BP 124/69   Pulse 95   Temp 97.8 F (36.6 C) (Oral)   Resp 18   Ht 5\' 2"  (1.575 m)   Wt 88 kg   LMP 10/10/2022 (Exact Date)   SpO2 97%   BMI 35.48 kg/m   Physical Exam: Gen: NAD Fundus Fundal Tone: Firm  Lochia Amount: Small        Data Review Labs: Lab Results  Component Value Date   WBC 11.4 (H) 07/15/2023   HGB 11.2 (L) 07/15/2023   HCT 34.9 (L) 07/15/2023   MCV 80.8 07/15/2023   PLT 227 07/15/2023      Latest Ref Rng & Units 07/15/2023   12:38 PM 06/24/2023    9:42 AM 04/30/2023    9:42 AM  CBC  WBC 4.0 - 10.5 K/uL 11.4  9.4  8.1   Hemoglobin 12.0 - 15.0 g/dL 13.0  86.5  78.4   Hematocrit 36.0 - 46.0 % 34.9  32.0  34.6   Platelets 150  - 400 K/uL 227  224  220    B POS  Edinburgh Score:    11/24/2022    3:44 PM  Edinburgh Postnatal Depression Scale Screening Tool  I have been able to laugh and see the funny side of things. 0  I have looked forward with enjoyment to things. 0  I have blamed myself unnecessarily when things went wrong. 2  I have been anxious or worried for no good reason. 3  I have felt scared or panicky for no good reason. 3  Things have been getting on top of me. 2  I have been so unhappy that I have had difficulty sleeping. 1  I have felt sad or miserable. 1  I have been so unhappy that I have been crying. 2  The thought of harming myself has occurred to me. 0  Edinburgh Postnatal Depression Scale Total 14    Assessment/Plan  Principal Problem:   Labor and delivery indication for care or intervention    Plan for discharge today.  Discharge Instructions: Per After Visit Summary. Activity: Advance as tolerated. Pelvic rest for 6 weeks.  Also refer to After Visit Summary Diet: Regular Medications: Allergies as of 07/15/2023   No Known Allergies   Med Rec must be completed prior to using this [facility]***      Outpatient follow up:  Postpartum contraception: Will discuss at first office visit post-partum  Discharged Condition: {condition:18240}  Discharged to: {discharge to:3041422::"home"}  Newborn Data: Disposition:{CHL IP OB HOME WITH MOTHER:23581}  Apgars: APGAR (1 MIN): 2  APGAR (5 MINS): 9  APGAR (10 MINS):    Baby Feeding: {Baby feeding:23562}    Elonda Husky, M.D. 07/15/2023 11:52 PM

## 2023-07-15 NOTE — H&P (Signed)
 [facility] Labor & Delivery  History and Physical  ASSESSMENT AND PLAN   Allison Black is a 28 y.o. [id number] at [redacted]w[redacted]d with EDD: 07/17/2023, Date entered prior to episode creation admitted for PROM.  Labor Management -SROM, overnight clear.  -3/70/-2 in clinic this morning.  -Plan for augmentation -Pain management per patient request -Anticipate SVD  Fetal Status: - cephalic presentation by Leopolds - EFW: 9# by Leopolds - continous  - FHT currently cat 1  Problems:  Bipolar/Anxiety -no meds -Plan to restart medication postpartum Was previously taking Abilify 2 mg, Prozac 40 mg and hydroxyzine 25 mg  -Close postpartum follow up for mood  Labs/Immunizations: Prenatal labs and studies: ABO, Rh: --/--/PENDING (04/09 1238) Antibody: PENDING (04/09 1238) Rubella: 1.92 (10/01 1123) Varicella: Reactive (10/01 1123)  RPR: Non Reactive (01/23 0942)  HBsAg: Negative (10/01 1123)  HepC:   HIV: Non Reactive (01/23 0942)  GBS: Negative/-- (03/19 0955)   TDAP: received Flu: declined RSV: no  Postpartum Plan: - Feeding: Breast Milk and Formula - Contraception: plans NFP - Prenatal Care Provider: AOB     HPI   Chief Complaint: PROM  Allison Black is a 28 y.o. [id number] at [redacted]w[redacted]d who presents for PROM. She was seen in clinic this morning and reported waking up in a puddle of water. ROM+ in triage. Denies vaginal bleeding or decreased fetal movement.      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  03/10/2023   Supervision of other normal pregnancy, antepartum 11/24/2022   Bipolar disease during pregnancy, antepartum (HCC) 08/15/2021   Candidal vaginitis 08/08/2021   High risk medication use 06/13/2021   Acute cystitis with hematuria 05/06/2021   At risk for prolonged QT interval syndrome 04/23/2021   Noncompliance with treatment plan 04/23/2021   Bipolar and related disorder (HCC) 03/27/2021   GAD (generalized anxiety disorder) 03/27/2021   Cannabis use disorder, moderate, in early remission, in controlled environment, dependence (HCC) 03/27/2021   Asthma 02/10/2017   Need for immunization against influenza 01/27/2017   Episodic lightheadedness 12/19/2016   Depression with anxiety 10/09/2016    Review of Systems A twelve point review of systems was negative except as stated in HPI.   HISTORY   Medications Medications Prior to Admission  Medication Sig Dispense Refill Last Dose/Taking   albuterol (VENTOLIN HFA) 108 (90 Base) MCG/ACT inhaler Inhale 1-2 puffs into the lungs every 6 (six) hours as needed for wheezing or shortness of breath. 1 g 1 07/14/2023   diphenhydrAMINE (BENADRYL) 25 mg capsule Take 25 mg by mouth as needed.   07/14/2023   Prenatal Vit-Fe Fumarate-FA (PRENATAL PO) Take by mouth.   07/14/2023    Allergies has no known allergies.   OB History OB History  Gravida Para Term Preterm AB Living  5 2 2  0 2 2  SAB IAB Ectopic Multiple Live Births  2 0 0 0 2    # Outcome Date GA Lbr Len/2nd Weight Sex Type Anes PTL Lv  5 Current           4 SAB 09/19/21  3 Term 03/27/17 [redacted]w[redacted]d / 00:25 4330 g F Vag-Spont EPI  LIV     Name: Mccorkle,PENDINGBABY     Apgar1: 8  Apgar5: 9  2 SAB 03/2015     SAB     1 Term 05/07/13 [redacted]w[redacted]d  4082 g [place] Clam LIV     Name: Coralyn Helling    Past Medical History Past Medical History:  Diagnosis Date   Asthma    Bipolar disorder (HCC)    Family history of ovarian cancer    5/23 cancer genetic testing letter sent   Heart murmur     Long term current use of cannabis 04/23/2021   Panic attack     Past Surgical History Past Surgical History:  Procedure Laterality Date   NO PAST SURGERIES      Social History  reports that she has quit smoking. Her smoking use included cigarettes. She has never used smokeless tobacco. She reports that she does not currently use alcohol after a past usage of about 1.0 standard drink of alcohol per week. She reports that she does not currently use drugs after having used the following drugs: Marijuana. Frequency: 28.00 times per week.   Family History family history includes ADD / ADHD in her sister; Anxiety disorder in her brother; Asperger's syndrome in her sister; Autism in her brother; Bipolar disorder in her maternal aunt, maternal aunt, maternal aunt, and mother; Depression in her sister; Diabetes in her cousin and maternal grandmother; Healthy in her brother, father, and maternal grandfather; Heart Problems in her paternal grandfather; Heart failure in her paternal grandmother; Lupus in her mother; Mental illness in her sister; Ovarian cancer in her maternal aunt; Parkinson's disease in her paternal grandmother; Schizophrenia in her maternal aunt; Suicidality in her maternal aunt.   PHYSICAL EXAM   Vitals:   07/15/23 1010 07/15/23 1014 07/15/23 1027 07/15/23 1100  BP: 107/72  120/70 112/68  Pulse: 86  83 85  Resp:   18 18  Temp:    97.8 F (36.6 C)  TempSrc:    Oral  Weight:  88 kg  88 kg  Height:  5\' 2"  (1.575 m)  5\' 2"  (1.575 m)    Constitutional: No acute distress, well appearing, and well nourished. Neurologic: She is alert and conversational.  Psychiatric: She has a normal mood and affect.  Musculoskeletal: Normal gait, grossly normal range of motion Cardiovascular: Normal rate.   Pulmonary/Chest: Normal work of breathing.  Gastrointestinal/Abdominal: Soft. Gravid. There is no tenderness.  Skin: Skin is warm and dry. No rash noted.  Genitourinary: Normal external  female genitalia.  SVE:  3/70/-2, in clinic   SSE: deferred  NST Interpretation Indication: PROM Baseline: 150 bpm Variability: moderate Accelerations: present Decelerations: none Contractions: regular, every 5 minutes Time noted:  See OBIX Impression: reactive Authenticated by: Oley Balm   Attending Dr. Logan Bores was immediately available for the care of the patient.   Oley Balm, CNM

## 2023-07-16 LAB — RPR: RPR Ser Ql: NONREACTIVE

## 2023-07-16 MED ORDER — TETANUS-DIPHTH-ACELL PERTUSSIS 5-2.5-18.5 LF-MCG/0.5 IM SUSY
0.5000 mL | PREFILLED_SYRINGE | Freq: Once | INTRAMUSCULAR | Status: DC
  Filled 2023-07-16: qty 0.5

## 2023-07-16 MED ORDER — DOCUSATE SODIUM 100 MG PO CAPS
100.0000 mg | ORAL_CAPSULE | Freq: Two times a day (BID) | ORAL | Status: DC
  Administered 2023-07-16 – 2023-07-17 (×4): 100 mg via ORAL
  Filled 2023-07-16 (×4): qty 1

## 2023-07-16 MED ORDER — DIPHENHYDRAMINE HCL 25 MG PO CAPS: 25.0000 mg | ORAL_CAPSULE | Freq: Four times a day (QID) | ORAL | Status: DC | PRN

## 2023-07-16 MED ORDER — IBUPROFEN 600 MG PO TABS
600.0000 mg | ORAL_TABLET | Freq: Four times a day (QID) | ORAL | Status: DC
  Administered 2023-07-16 – 2023-07-17 (×7): 600 mg via ORAL
  Filled 2023-07-16 (×7): qty 1

## 2023-07-16 MED ORDER — ZOLPIDEM TARTRATE 5 MG PO TABS: 5.0000 mg | ORAL_TABLET | Freq: Every evening | ORAL | Status: DC | PRN

## 2023-07-16 MED ORDER — ACETAMINOPHEN 325 MG PO TABS
650.0000 mg | ORAL_TABLET | ORAL | Status: DC | PRN
  Administered 2023-07-16 (×2): 650 mg via ORAL
  Filled 2023-07-16 (×2): qty 2

## 2023-07-16 MED ORDER — OXYTOCIN-SODIUM CHLORIDE 30-0.9 UT/500ML-% IV SOLN: 2.5000 [IU]/h | INTRAVENOUS | Status: DC | PRN

## 2023-07-16 MED ORDER — OXYCODONE-ACETAMINOPHEN 5-325 MG PO TABS: 1.0000 | ORAL_TABLET | ORAL | Status: DC | PRN

## 2023-07-16 MED ORDER — PRENATAL MULTIVITAMIN CH
1.0000 | ORAL_TABLET | Freq: Every day | ORAL | Status: DC
  Administered 2023-07-16 – 2023-07-17 (×2): 1 via ORAL
  Filled 2023-07-16 (×2): qty 1

## 2023-07-16 MED ORDER — BENZOCAINE-MENTHOL 20-0.5 % EX AERO
1.0000 | INHALATION_SPRAY | CUTANEOUS | Status: DC | PRN
  Filled 2023-07-16: qty 56

## 2023-07-16 MED ORDER — SIMETHICONE 80 MG PO CHEW: 80.0000 mg | CHEWABLE_TABLET | ORAL | Status: DC | PRN

## 2023-07-16 MED ORDER — OXYCODONE HCL 5 MG PO TABS
5.0000 mg | ORAL_TABLET | ORAL | Status: DC | PRN
  Administered 2023-07-16 – 2023-07-17 (×3): 5 mg via ORAL
  Filled 2023-07-16 (×3): qty 1

## 2023-07-16 NOTE — Progress Notes (Signed)
 Subjective:   Allison Black had a NSVB on 07/15/23. Her labor was complicated by shoulder dystocia which was relieved after delivery of the posterior arm. Has had routine postpartum care.  Pt. Is eating, hydrating, and voiding regularly without difficulty. Has yet to have BM. She is breastfeeding/bottle feeding, supplementing with formula due to low blood sugars. Reports small amount of vaginal bleeding, denies passing large blood clots. Has had cramping abdomen pain not fully relieved with tylenol/ibuprofen, may consider taking a stronger medication. Plans to use NFP for contraception. Denies anxiety/depression symptoms. Endorses good support from partner and family.   Patient has history of bipolar. Was previously taking prozac, hydroxyzine and abilify. Has not taken these in two years and is not interested in restarting these meds. Right now she feels good emotionally. She does state she is worried about potentially developing depression. States with her daughter she felt very disconnected from others although was hypervigilant for her baby.  Felt overall "out of it". Shanda Bumps CNM had told her about zurzuvae prenatally, was wondering if this might be an option for her. Did discuss that it was not preventative, has also not been studied in relation to lactation. Encouraged to reach out to the clinic if she starts to have depression symptoms. Currently declines starting any SSRI.   Objective:  Vital signs in last 24 hours: Temp:  [97.5 F (36.4 C)-98.6 F (37 C)] 98.6 F (37 C) (04/10 1204) Pulse Rate:  [62-98] 76 (04/10 1204) Resp:  [18-20] 18 (04/10 1204) BP: (91-124)/(51-85) 105/68 (04/10 1204) SpO2:  [95 %-100 %] 99 % (04/10 1204)    General: NAD Pulmonary: no increased work of breathing Breasts: soft, non-tender, nipples without breakdown Abdomen: soft, non-tender Fundus: firm, midline, at umbilicus Lochia: light rubra, no clots Perineum: no erythema or foul odor discharge,  minimal edema, laceration well approximated  Extremities: no edema, no erythema, no tenderness  Results for orders placed or performed during the hospital encounter of 07/15/23 (from the past 72 hours)  Wet prep, genital     Status: None   Collection Time: 07/15/23 10:24 AM  Result Value Ref Range   Yeast Wet Prep HPF POC NONE SEEN NONE SEEN   Trich, Wet Prep NONE SEEN NONE SEEN   Clue Cells Wet Prep HPF POC NONE SEEN NONE SEEN   WBC, Wet Prep HPF POC <10 <10   Sperm NONE SEEN     Comment: Specimen diluted due to transport tube containing more than 1 ml of saline, interpret results with caution. Performed at [facility], [address] [place], [place] [address]   Rupture of Membrane (ROM) Plus     Status: None   Collection Time: 07/15/23 10:24 AM  Result Value Ref Range   Rom Plus POSITIVE     Comment: Performed at [facility], [address] [place], [place] [address]  CBC     Status: Abnormal   Collection Time: 07/15/23 12:38 PM  Result Value Ref Range   WBC 11.4 (H) 4.0 - 10.5 K/uL   RBC 4.32 3.87 - 5.11 MIL/uL   Hemoglobin 11.2 (L) 12.0 - 15.0 g/dL   HCT 29.5 (L) 62.1 - 30.8 %   MCV 80.8 80.0 - 100.0 fL   MCH 25.9 (L) 26.0 - 34.0 pg   MCHC 32.1 30.0 - 36.0 g/dL   RDW 65.7 84.6 - 96.2 %   Platelets 227 150 - 400 K/uL   nRBC 0.0 0.0 - 0.2 %  Comment: Performed at [facility], [address] [place], [place] [address]  Type and screen [facility]     Status: None   Collection Time: 07/15/23 12:38 PM  Result Value Ref Range   ABO/RH(D) B POS    Antibody Screen NEG    Sample Expiration      07/18/2023,2359 Performed at [facility], [address] [place], [place] [address]   RPR     Status: None   Collection Time: 07/15/23 12:38 PM  Result Value Ref Range   RPR Ser Ql NON REACTIVE NON REACTIVE    Comment: Performed at [facility], [address] [place], [place] [address]  Urine Drug Screen,  Qualitative ([facility] only)     Status: None   Collection Time: 07/15/23  7:46 PM  Result Value Ref Range   Tricyclic, Ur Screen NONE DETECTED NONE DETECTED   Amphetamines, Ur Screen NONE DETECTED NONE DETECTED   MDMA (Ecstasy)Ur Screen NONE DETECTED NONE DETECTED   Cocaine Metabolite,Ur [place] NONE DETECTED NONE DETECTED   Opiate, Ur Screen NONE DETECTED NONE DETECTED   Phencyclidine (PCP) Ur S NONE DETECTED NONE DETECTED   Cannabinoid 50 Ng, Ur [place] NONE DETECTED NONE DETECTED   Barbiturates, Ur Screen NONE DETECTED NONE DETECTED   Benzodiazepine, Ur Scrn NONE DETECTED NONE DETECTED   Methadone Scn, Ur NONE DETECTED NONE DETECTED    Comment: (NOTE) Tricyclics + metabolites, urine    Cutoff 1000 ng/mL Amphetamines + metabolites, urine  Cutoff 1000 ng/mL MDMA (Ecstasy), urine              Cutoff 500 ng/mL Cocaine Metabolite, urine          Cutoff 300 ng/mL Opiate + metabolites, urine        Cutoff 300 ng/mL Phencyclidine (PCP), urine         Cutoff 25 ng/mL Cannabinoid, urine                 Cutoff 50 ng/mL Barbiturates + metabolites, urine  Cutoff 200 ng/mL Benzodiazepine, urine              Cutoff 200 ng/mL Methadone, urine                   Cutoff 300 ng/mL  The urine drug screen provides only a preliminary, unconfirmed analytical test result and should not be used for non-medical purposes. Clinical consideration and professional judgment should be applied to any positive drug screen result due to possible interfering substances. A more specific alternate chemical method must be used in order to obtain a confirmed analytical result. Gas chromatography / mass spectrometry (GC/MS) is the preferred confirm atory method. Performed at [facility], [address], [place], [place] [address]     Assessment:   28 y.o. [phone] 1 day(s)  s/p NSVB Breastfeeding/supplementing with formula due to hypoglycemia of newborn VSS Moderate amount of pain Hx bipolar: not on meds for past  two years, at risk for PPD  Plan:    Order added for PRN oxycodone for further pain management Discussed uses of zurzuva, also potentially restarting SSRI (zoloft) if she starts having depressive symptoms. Encouraged to call clinic at first signs. Blood Type --/--/B POS (04/09 1238) / Rubella 1.92 (10/01 1123) / Varicella Immune Rhogam not indicated Tdap/varicella/rubella to be offered before discharge if indicated Feeding plan breast & bottle, lactation support Encouraged to continue breastfeeding, BF education on latch, position changes, cluster feeding, hunger cues,  lactogenesis II, milk supply Education given regarding options for contraception, as well as compatibility with breast feeding if applicable.  Patient plans on  NFP  for contraception. Continued routine postpartum care  Counseled on normal uterine involution and vaginal bleeding postpartum Anticipate discharge home tomorrow    Raeford Razor, FNP, CNM [facility] 07/16/2023, 12:14 PM

## 2023-07-16 NOTE — Lactation Note (Signed)
 This note was copied from a baby's chart. Lactation Consultation Note  Patient Name: Allison Black [id number] Date: 07/16/2023 Age:29 hours Reason for consult: Initial assessment;Term   Maternal Data This is mom's 3rd baby, SVD. Mom with history of bipolar disorder.  Initial visit mom reports she was breastfeeding and then began formula feeding once baby's blood sugar was low. Mom reports she may pump and provide milk in addition to formula feeding as she did with her 2 previous children. Offered mom to pump X2 this evening. Mom prefers to start pumping tomorrow morning. Mom reports neither of her 2 previous children latched and this baby does latch. She may consider doing some breastfeeding but for now her nipples are too tender. Mom is at this time undecided what she will do. Mom would like LC follow-up tomorrow morning. Has patient been taught Hand Expression?:  (Not at this time. Mom prefers to rest tonight and will revisit breastpumping and/or breastfeeding in am.For now mom prefers to bottle feed baby formula.) Does the patient have breastfeeding experience prior to this delivery?: Yes How long did the patient breastfeed?: Mom pumped for 2 months for each of her previous babies as neither baby would latch.  Feeding Mother's Current Feeding Choice: Breast Milk and Formula  Interventions Interventions: Education LC number on white board.  Discharge Personal pump  Consult Status Consult Status: Follow-up Date: 07/17/23 Follow-up type: In-patient  Update provided to care nurse  Fuller Song 07/16/2023, 10:03 PM

## 2023-07-17 MED ORDER — IBUPROFEN 600 MG PO TABS: 600.0000 mg | ORAL_TABLET | Freq: Four times a day (QID) | ORAL | Status: DC

## 2023-07-17 MED ORDER — ACETAMINOPHEN 325 MG PO TABS: 650.0000 mg | ORAL_TABLET | ORAL | Status: AC | PRN

## 2023-07-17 NOTE — Anesthesia Postprocedure Evaluation (Signed)
 Anesthesia Post Note  Patient: Denetra Formoso  Procedure(s) Performed: AN AD HOC LABOR EPIDURAL  Patient location during evaluation: Mother Baby Anesthesia Type: Epidural Level of consciousness: awake and alert Pain management: pain level controlled Vital Signs Assessment: post-procedure vital signs reviewed and stable Respiratory status: spontaneous breathing, nonlabored ventilation and respiratory function stable Cardiovascular status: stable Postop Assessment: no headache, no backache, epidural receding and able to ambulate Anesthetic complications: no   No notable events documented.   Last Vitals:  Vitals:   07/16/23 1717 07/17/23 0020  BP: 100/69 90/65  Pulse: 81 93  Resp: 18 20  Temp: 36.4 C 36.6 C  SpO2: 98% 99%    Last Pain:  Vitals:   07/17/23 0159  TempSrc:   PainSc: 7                  Rosanne Gutting

## 2023-07-17 NOTE — Progress Notes (Signed)
 Patient discharged home with family. Discharge instructions, when to follow up, and prescriptions reviewed with patient. Patient verbalized understanding. Patient will be escorted out by auxiliary.

## 2023-07-17 NOTE — Lactation Note (Signed)
 This note was copied from a baby's chart. Lactation Consultation Note  Patient Name: Girl Aqsa Sensabaugh [id number] Date: 07/17/2023 Age:29 hours Reason for consult: Follow-up assessment;Term;Other (Comment) (Discharge education)   Maternal Data Follow up assessment dyad.  Patient stated that she primarily has been giving formula because infants sugars were low.  This was her first infant that she was ever able to put to the breast who latched stated mom.   Feeding Mother's Current Feeding Choice: Breast Milk and Formula Nipple Type: Slow - flow  No feeding was observed.   Lactation Tools Discussed/Used Tools: Pump Breast pump type: Double-Electric Breast Pump Pump Education: Setup, frequency, and cleaning;Milk Storage Reason for Pumping: Stimulating breast when infant gets a bottle of formula Pumping frequency: Whenever baby doesnt go to the breast. but gets a bottle of formula  LC set patient up with a DEBP because she wants to provide breastmilk but has not been stimulating the breast.  LC sat with patient as she pumped for 15 minutes. Small drops of colostrum were collected that would be fed to infant.   Interventions Interventions: Breast feeding basics reviewed;DEBP;Education;Comfort gels;CDC milk storage guidelines;CDC Guidelines for Breast Pump Cleaning  Discharge Discharge Education: Engorgement and breast care;Warning signs for feeding baby;Outpatient recommendation Pump: Personal;H[employer] [place] Education on engorgement prevention/treatment was discussed as well as breastmilk storage guidelines.  LC provided patient with a handout on breastmilk storage guidelines from [facility]. [facility] outpatient lactation services phone number written on the white board in the room.  Patient verbalized understanding.  LC also provided education from the postpartum book about warning signs to look for in a poor feeding.    Consult Status Consult Status: Complete Follow-up type: Call as  needed    Yvette Rack Garland Smouse 07/17/2023, 11:46 AM

## 2023-07-20 ENCOUNTER — Encounter: Admitting: Obstetrics

## 2023-07-20 ENCOUNTER — Other Ambulatory Visit

## 2023-07-29 ENCOUNTER — Ambulatory Visit (INDEPENDENT_AMBULATORY_CARE_PROVIDER_SITE_OTHER): Admitting: Certified Nurse Midwife

## 2023-07-29 ENCOUNTER — Encounter: Payer: Self-pay | Admitting: Certified Nurse Midwife

## 2023-07-29 MED ORDER — DICLOXACILLIN SODIUM 500 MG PO CAPS: 500.0000 mg | ORAL_CAPSULE | Freq: Four times a day (QID) | ORAL | 0 refills | Status: DC

## 2023-07-29 NOTE — Progress Notes (Signed)
      Postpartum Visit  Subjective:    Subjective  Allison Black is a 28 y.o. female who presents for a postpartum visit. She is2 weeks  postpartum following a spontaneous vaginal delivery. I have fully reviewed the prenatal and intrapartum course. The delivery was at 37.5 gestational age.  Anesthesia: epidural.    Postpartum course has been normal. She has no pain and no other concerns. Baby's course has been normal. Baby is feeding by  both breast and bottle - pumped milk . Patient reports body aches, low grade fever, breast pain and lower milk supply over the past 2 days.  Bleeding no bleeding. Bowel function is normal. Bladder function is normal.  .  Contraception method: rhythm method. Postpartum depression screening:      Review of Systems Pertinent positives noted in HPI. Remainder of comprehensive ROS otherwise negative.     Objective:      Objective  There were no vitals taken for this visit.  General:  alert, cooperative, and no distress    Breasts-bilateral engorgement. Left breast - large red spot from 3o'clock to 5o'clock from nipple to outer edge.     Assessment/Plan:    1. Encounter for screening for maternal depression - Screening Negative today. Will rescreen at 6 week postpartum visit.     2. Postpartum state - Overall doing well. Continue routine postpartum home care.    3. Mastitis -Encouraged rest, fluids, empty the breast.". Nurse / pump every 2-3 hours. For pain and fever, recommend:  Ibuprofen  600 mg q6h. Counsel patient that symptoms should improve in 24 to 48 hours. If symptoms persists after 24-48 hours, please call the clinic -dicloxacillin  500mg  QID x 10 days       Donato Fu, CNM

## 2023-07-29 NOTE — Patient Instructions (Signed)

## 2023-08-26 ENCOUNTER — Ambulatory Visit (INDEPENDENT_AMBULATORY_CARE_PROVIDER_SITE_OTHER): Admitting: Certified Nurse Midwife

## 2023-08-26 ENCOUNTER — Other Ambulatory Visit (HOSPITAL_COMMUNITY)
Admission: RE | Admit: 2023-08-26 | Discharge: 2023-08-26 | Disposition: A | Discharge status: Home / Self Care | Source: Ambulatory Visit | Attending: Certified Nurse Midwife | Admitting: Certified Nurse Midwife

## 2023-08-26 DIAGNOSIS — Z124 Encounter for screening for malignant neoplasm of cervix: Secondary | ICD-10-CM

## 2023-08-26 NOTE — Progress Notes (Addendum)
   OBSTETRICS POSTPARTUM CLINIC PROGRESS NOTE  Subjective:     Allison Black is a 28 y.o. [phone] female who presents for a postpartum visit. She is 6 weeks postpartum following a spontaneous vaginal delivery. I have fully reviewed the prenatal and intrapartum course. The delivery was at 39.5 gestational weeks.  Anesthesia: epidural. Postpartum course has been going well. Baby's course has been good. Baby is feeding by both breast and bottle - Similac Total Care 360. Bleeding: patient has not resumed menses, with Patient's last menstrual period was 10/10/2022 (exact date).. Bowel function is normal. Bladder function is normal. Patient is not sexually active. Contraception method desired is none. Postpartum depression screening: positive.  EDPS score is 13.   Patient does report increased anxiety but she is able to sleep and care for herself without difficulty. Has delayed returning to work because she does not feel ready. She denies thought of self harm or harm to her baby.   Has some shooting pain in her left nipple with let down. It goes away within 30 seconds. Was treated for mastitis 4 weeks ago.    The following portions of the patient's history were reviewed and updated as appropriate: allergies, current medications, past family history, past medical history, past social history, past surgical history, and problem list.  Review of Systems Pertinent items noted in HPI and remainder of comprehensive ROS otherwise negative.   Objective:    LMP 10/10/2022 (Exact Date)   General:  alert and no distress   Breasts:  inspection negative, no nipple discharge or bleeding, no masses or nodularity palpable  Lungs: clear to auscultation bilaterally  Heart:  regular rate and rhythm, S1, S2 normal, no murmur, click, rub or gallop  Abdomen: soft, non-tender; bowel sounds normal; no masses,  no organomegaly.  Well healed Pfannenstiel incision   Vulva:  normal  Vagina: normal vagina, no discharge,  exudate, lesion, or erythema  Cervix:  no cervical motion tenderness and no lesions  Corpus: normal size, contour, position, consistency, mobility, non-tender  Adnexa:  normal adnexa and no mass, fullness, tenderness  Rectal Exam: Not performed.        Labs:  Lab Results  Component Value Date   HGB 11.2 (L) 07/15/2023     Assessment:   1. Postpartum care following vaginal delivery   2. Cervical cancer screening      Plan:    1. Contraception: none 2. Return to care with increased anxiety or depression symptoms.  3. Follow up in: in one year for annual exam.   Donato Fu, CNM [facility]

## 2023-08-26 NOTE — Patient Instructions (Signed)

## 2023-09-03 LAB — CYTOLOGY - PAP: Diagnosis: NEGATIVE

## 2023-09-21 ENCOUNTER — Ambulatory Visit

## 2023-10-20 ENCOUNTER — Encounter: Admitting: Family Medicine

## 2023-11-13 ENCOUNTER — Other Ambulatory Visit: Payer: Self-pay | Admitting: Medical Genetics

## 2023-11-19 ENCOUNTER — Ambulatory Visit

## 2023-11-19 ENCOUNTER — Ambulatory Visit (INDEPENDENT_AMBULATORY_CARE_PROVIDER_SITE_OTHER)

## 2023-11-19 VITALS — BP 109/69 | HR 104 | Resp 16 | Ht 62.0 in | Wt 193.6 lb

## 2023-11-19 DIAGNOSIS — R102 Pelvic and perineal pain: Secondary | ICD-10-CM

## 2023-11-19 DIAGNOSIS — Z3201 Encounter for pregnancy test, result positive: Secondary | ICD-10-CM

## 2023-11-19 DIAGNOSIS — N912 Amenorrhea, unspecified: Secondary | ICD-10-CM

## 2023-11-19 LAB — POCT URINE PREGNANCY: Preg Test, Ur: POSITIVE — AB

## 2023-11-19 NOTE — Patient Instructions (Signed)
 Morning Sickness Morning sickness is when you throw up or feel like you may throw up during pregnancy. This condition often occurs in the morning, but it can also occur at any time of day. Morning sickness is most common during the first three months of pregnancy, but it can go on throughout the pregnancy. Morning sickness is usually harmless. But if you throw up all the time, you should see your health care provider. You may also hear this condition called nausea and vomiting of pregnancy. What are the causes? The cause of morning sickness is not known. It may be linked to changes in hormones during pregnancy. What increases the risk? You're more likely to have morning sickness if: You had morning sickness in another pregnancy. You're pregnant with more than one baby, such as twins. You had morning sickness in other pregnancies. You have had motion sickness before you were pregnant. You have had bad headaches or migraines before you were pregnant. What are the signs or symptoms? Symptoms of morning sickness include: Feeling like you may throw up. Throwing up. How is this diagnosed? Morning sickness is diagnosed based on your symptoms. How is this treated? Treatment is usually not needed for morning sickness. You may only need to change what you eat. In some cases, your provider may give you: Vitamin B6 supplements. Medicines to prevent throwing up. Ginger. Follow these instructions at home: Medicines Take your medicines only as told by your provider. Do not use any prescription, over-the-counter, or herbal medicines for morning sickness without first talking with your provider. Take prenatal vitamins. These can stop or lessen the symptoms of morning sickness. If you feel like you may throw up after taking prenatal vitamins, take them at night or with a snack. Eating and drinking     Eat dry toast or crackers before getting out of bed. Eat 5 or 6 small meals a day. Try ginger ale  made with real ginger, ginger tea, or ginger candies. Drink fluids throughout the day. Eat protein foods when you need a snack. Nuts, yogurt, and cheese are good choices. Eat dry and bland foods like rice or baked potatoes. Foods that are high in carbohydrates are often helpful. Have someone cook for you if the smell of food makes you want to throw up. Foods to avoid Greasy foods. Fatty foods. Spicy foods. General instructions Try to avoid smells that make you feel sick. Use an air purifier to keep the air in your house free of smells. Try using an acupressure wristband. This is a wristband that's used to treat motion sickness. Try acupuncture. In this treatment, a provider puts thin needles into certain areas of your body to make you feel better. Brush your teeth after throwing up or rinse with a mix of baking soda and water. The acid in throw-up can hurt your teeth. Contact a health care provider if: Your symptoms do not get better. You feel dizzy or light-headed. You're losing weight. Get help right away if: The feeling that you may throw up will not go away, or you can't stop throwing up. You faint. You have very bad pain in your belly. This information is not intended to replace advice given to you by your health care provider. Make sure you discuss any questions you have with your health care provider. Document Revised: 12/25/2022 Document Reviewed: 07/03/2022 Elsevier Patient Education  2024 Elsevier Inc.First Trimester of Pregnancy  The first trimester of pregnancy starts on the first day of your last monthly period  until the end of week 13. This is months 1 through 3 of pregnancy. A week after a sperm fertilizes an egg, the egg will implant into the wall of the uterus and begin to develop into a baby. Body changes during your first trimester Your body goes through many changes during pregnancy. The changes usually return to normal after your baby is born. Physical  changes Your breasts may grow larger and may hurt. The area around your nipples may get darker. Your periods will stop. Your hair and nails may grow faster. You may pee more often. Health changes You may tire easily. Your gums may bleed and may be sensitive when you brush and floss. You may not feel hungry. You may have heartburn. You may throw up or feel like you may throw up. You may want to eat some foods, but not others. You may have headaches. You may have trouble pooping (constipation). Other changes Your emotions may change from day to day. You may have more dreams. Follow these instructions at home: Medicines Talk to your health care provider if you're taking medicines. Ask if the medicines are safe to take during pregnancy. Your provider may change the medicines that you take. Do not take any medicines unless told to by your provider. Take a prenatal vitamin that has at least 600 micrograms (mcg) of folic acid. Do not use herbal medicines, illegal substances, or medicines that are not approved by your provider. Eating and drinking While you're pregnant your body needs extra food for your growing baby. Talk with your provider about what to eat while pregnant. Activity Most women are able to exercise during pregnancy. Exercises may need to change as your pregnancy goes on. Talk to your provider about your activities and exercise routines. Relieving pain and discomfort Wear a good, supportive bra if your breasts hurt. Rest with your legs raised if you have leg cramps or low back pain. Safety Wear your seatbelt at all times when you're in a car. Talk to your provider if someone hits you, hurts you, or yells at you. Talk with your provider if you're feeling sad or have thoughts of hurting yourself. Lifestyle Certain things can be harmful while you're pregnant. Follow these rules: Do not use hot tubs, steam rooms, or saunas. Do not douche. Do not use tampons or scented  pads. Do not drink alcohol,smoke, vape, or use products with nicotine or tobacco in them. If you need help quitting, talk with your provider. Avoid cat litter boxes and soil used by cats. These things carry germs that can cause harm to your pregnancy and your baby. General instructions Keep all follow-up visits. It helps you and your unborn baby stay as healthy as possible. Write down your questions. Take them to your visits. Your provider will: Talk with you about your overall health. Give you advice or refer you to specialists who can help with different needs, including: Prenatal education classes. Mental health and counseling. Foods and healthy eating. Ask for help if you need help with food. Call your dentist and ask to be seen. Brush your teeth with a soft toothbrush. Floss gently. Where to find more information American Pregnancy Association: americanpregnancy.org [employer]: acog.org Office on [employer] Health: [employer] Contact a health care provider if: You feel dizzy, faint, or have a fever. You vomit or have watery poop (diarrhea) for 2 days or more. You have abnormal discharge or bleeding from your vagina. You have pain when you pee or  your pee smells bad. You have cramps, pain, or pressure in your belly area. Get help right away if: You have trouble breathing or chest pain. You have any kind of injury, such as from a fall or a car crash. These symptoms may be an emergency. Get help right away. Call 911. Do not wait to see if the symptoms will go away. Do not drive yourself to the hospital. This information is not intended to replace advice given to you by your health care provider. Make sure you discuss any questions you have with your health care provider. Document Revised: 12/25/2022 Document Reviewed: 07/25/2022 Elsevier Patient Education  2024 Elsevier Inc.Commonly Asked Questions During Pregnancy  Cats: A parasite can be  excreted in cat feces.  To avoid exposure you need to have another person empty the little box.  If you must empty the litter box you will need to wear gloves.  Wash your hands after handling your cat.  This parasite can also be found in raw or undercooked meat so this should also be avoided.  Colds, Sore Throats, Flu: Please check your medication sheet to see what you can take for symptoms.  If your symptoms are unrelieved by these medications please call the office.  Dental Work: Most any dental work Agricultural consultant recommends is permitted.  X-rays should only be taken during the first trimester if absolutely necessary.  Your abdomen should be shielded with a lead apron during all x-rays.  Please notify your provider prior to receiving any x-rays.  Novocaine is fine; gas is not recommended.  If your dentist requires a note from [place] prior to dental work please call the office and we will provide one for you.  Exercise: Exercise is an important part of staying healthy during your pregnancy.  You may continue most exercises you were accustomed to prior to pregnancy.  Later in your pregnancy you will most likely notice you have difficulty with activities requiring balance like riding a bicycle.  It is important that you listen to your body and avoid activities that put you at a higher risk of falling.  Adequate rest and staying well hydrated are a must!  If you have questions about the safety of specific activities ask your provider.    Exposure to Children with illness: Try to avoid obvious exposure; report any symptoms to [place] when noted,  If you have chicken pos, red measles or mumps, you should be immune to these diseases.   Please do not take any vaccines while pregnant unless you have checked with your OB provider.  Fetal Movement: After 28 weeks we recommend you do "kick counts" twice daily.  Lie or sit down in a calm quiet environment and count your baby movements "kicks".  You should feel your baby at  least 10 times per hour.  If you have not felt 10 kicks within the first hour get up, walk around and have something sweet to eat or drink then repeat for an additional hour.  If count remains less than 10 per hour notify your provider.  Fumigating: Follow your pest control agent's advice as to how long to stay out of your home.  Ventilate the area well before re-entering.  Hemorrhoids:   Most over-the-counter preparations can be used during pregnancy.  Check your medication to see what is safe to use.  It is important to use a stool softener or fiber in your diet and to drink lots of liquids.  If hemorrhoids seem to be  getting worse please call the office.   Hot Tubs:  Hot tubs Jacuzzis and saunas are not recommended while pregnant.  These increase your internal body temperature and should be avoided.  Intercourse:  Sexual intercourse is safe during pregnancy as long as you are comfortable, unless otherwise advised by your provider.  Spotting may occur after intercourse; report any bright red bleeding that is heavier than spotting.  Labor:  If you know that you are in labor, please go to the hospital.  If you are unsure, please call the office and let us help you decide what to do.  Lifting, straining, etc:  If your job requires heavy lifting or straining please check with your provider for any limitations.  Generally, you should not lift items heavier than that you can lift simply with your hands and arms (no back muscles)  Painting:  Paint fumes do not harm your pregnancy, but may make you ill and should be avoided if possible.  Latex or water based paints have less odor than oils.  Use adequate ventilation while painting.  Permanents & Hair Color:  Chemicals in hair dyes are not recommended as they cause increase hair dryness which can increase hair loss during pregnancy.  " Highlighting" and permanents are allowed.  Dye may be absorbed differently and permanents may not hold as well during  pregnancy.  Sunbathing:  Use a sunscreen, as skin burns easily during pregnancy.  Drink plenty of fluids; avoid over heating.  Tanning Beds:  Because their possible side effects are still unknown, tanning beds are not recommended.  Ultrasound Scans:  Routine ultrasounds are performed at approximately 20 weeks.  You will be able to see your baby's general anatomy an if you would like to know the gender this can usually be determined as well.  If it is questionable when you conceived you may also receive an ultrasound early in your pregnancy for dating purposes.  Otherwise ultrasound exams are not routinely performed unless there is a medical necessity.  Although you can request a scan we ask that you pay for it when conducted because insurance does not cover " patient request" scans.  Work: If your pregnancy proceeds without complications you may work until your due date, unless your physician or employer advises otherwise.  Round Ligament Pain/Pelvic Discomfort:  Sharp, shooting pains not associated with bleeding are fairly common, usually occurring in the second trimester of pregnancy.  They tend to be worse when standing up or when you remain standing for long periods of time.  These are the result of pressure of certain pelvic ligaments called "round ligaments".  Rest, Tylenol and heat seem to be the most effective relief.  As the womb and fetus grow, they rise out of the pelvis and the discomfort improves.  Please notify the office if your pain seems different than that described.  It may represent a more serious condition.  Common Medications Safe in Pregnancy  Acne:      Constipation:  Benzoyl Peroxide     Colace  Clindamycin      Dulcolax Suppository  Topica Erythromycin     Fibercon  Salicylic Acid      Metamucil         Miralax AVOID:        Senakot   Accutane    Cough:  Retin-A       Cough Drops  Tetracycline      Phenergan w/ Codeine if Rx  Minocycline  Robitussin (Plain &  DM)  Antibiotics:     Crabs/Lice:  Ceclor       RID  Cephalosporins    AVOID:  E-Mycins      Kwell  Keflex  Macrobid/Macrodantin   Diarrhea:  Penicillin      Kao-Pectate  Zithromax      Imodium AD         PUSH FLUIDS AVOID:       Cipro     Fever:  Tetracycline      Tylenol (Regular or Extra  Minocycline       Strength)  Levaquin      Extra Strength-Do not          Exceed 8 tabs/24 hrs Caffeine:        200mg /day (equiv. To 1 cup of coffee or  approx. 3 12 oz sodas)         Gas: Cold/Hayfever:       Gas-X  Benadryl      Mylicon  Claritin       Phazyme  **Claritin-D        Chlor-Trimeton    Headaches:  Dimetapp      ASA-Free Excedrin  Drixoral-Non-Drowsy     Cold Compress  Mucinex (Guaifenasin)     Tylenol (Regular or Extra  Sudafed/Sudafed-12 Hour     Strength)  **Sudafed PE Pseudoephedrine   Tylenol Cold & Sinus     Vicks Vapor Rub  Zyrtec  **AVOID if Problems With Blood Pressure         Heartburn: Avoid lying down for at least 1 hour after meals  Aciphex      Maalox     Rash:  Milk of Magnesia     Benadryl    Mylanta       1% Hydrocortisone Cream  Pepcid  Pepcid Complete   Sleep Aids:  Prevacid      Ambien   Prilosec       Benadryl  Rolaids       Chamomile Tea  Tums (Limit 4/day)     Unisom         Tylenol PM         Warm milk-add vanilla or  Hemorrhoids:       Sugar for taste  Anusol/Anusol H.C.  (RX: Analapram 2.5%)  Sugar Substitutes:  Hydrocortisone OTC     Ok in moderation  Preparation H      Tucks        Vaseline lotion applied to tissue with wiping    Herpes:     Throat:  Acyclovir      Oragel  Famvir  Valtrex     Vaccines:         Flu Shot Leg Cramps:       *Gardasil  Benadryl      Hepatitis A         Hepatitis B Nasal Spray:       Pneumovax  Saline Nasal Spray     Polio Booster         Tetanus Nausea:       Tuberculosis test or PPD  Vitamin B6 25 mg TID   AVOID:    Dramamine      *Gardasil  Emetrol       Live Poliovirus  Ginger  Root 250 mg QID    MMR (measles, mumps &  High Complex Carbs @ Bedtime    rebella)  Sea Bands-Accupressure    Varicella (Chickenpox)  Unisom 1/2  tab TID     *No known complications           If received before Pain:         Known pregnancy;   Darvocet       Resume series after  Lortab        Delivery  Percocet    Yeast:   Tramadol      Femstat  Tylenol 3      Gyne-lotrimin  Ultram       Monistat  Vicodin           MISC:         All Sunscreens           Hair Coloring/highlights          Insect Repellant's          (Including DEET)         Mystic Tans

## 2023-11-19 NOTE — Progress Notes (Addendum)
    NURSE VISIT NOTE  Subjective:    Patient ID: Allison Black, female    DOB: 06-11-1995, 28 y.o.   MRN: [id number]  HPI  Patient is a 28 y.o. [id number] female who presents for evaluation of amenorrhea. She believes she could be pregnant. Pregnancy is desired. Sexual Activity: single partner, contraception: none. Current symptoms also include: fatigue, frequent urination, nausea, positive home pregnancy test, and cramping. Last period was normal. Patient was complaining of lower left pelvic pain and cramping. She states that pain radiates down her leg, she is concerned about this pregnancy being a tubal pregnancy.  I was advised by Eleanor Canny to order a ultrasound on her.     Objective:    Resp 16   Ht 5' 2 (1.575 m)   Wt 193 lb 9.6 oz (87.8 kg)   LMP 10/10/2023 (Exact Date)   Breastfeeding No   BMI 35.41 kg/m   Lab Review  No results found for any visits on 11/19/23.  Assessment:   1. Amenorrhea     Plan:  Ultrasound ordered: Pelvic Pain Pregnancy Test: Positive  Estimated Date of Delivery: 07/16/24 BP Cuff Measurement taken. Cuff Size Adult Small Encouraged well-balanced diet, plenty of rest when needed, pre-natal vitamins daily and walking for exercise.  Discussed self-help for nausea, avoiding OTC medications until consulting provider or pharmacist, other than Tylenol  as needed, minimal caffeine (1-2 cups daily) and avoiding alcohol.   She will schedule her nurse visit @ 7-[redacted] wks pregnant, u/s for dating @10  wk, and NOB visit at [redacted] wk pregnant.    Feel free to call with any questions.      Camelia Fetters, CMA [facility]

## 2023-11-20 ENCOUNTER — Encounter: Admitting: Family Medicine

## 2023-11-20 ENCOUNTER — Other Ambulatory Visit: Payer: Self-pay | Admitting: Certified Nurse Midwife

## 2023-11-20 DIAGNOSIS — R102 Pelvic and perineal pain: Secondary | ICD-10-CM

## 2023-11-20 DIAGNOSIS — N912 Amenorrhea, unspecified: Secondary | ICD-10-CM

## 2023-11-20 DIAGNOSIS — Z3491 Encounter for supervision of normal pregnancy, unspecified, first trimester: Secondary | ICD-10-CM

## 2023-11-23 ENCOUNTER — Ambulatory Visit
Admission: RE | Admit: 2023-11-23 | Discharge: 2023-11-23 | Disposition: A | Discharge status: Home / Self Care | Source: Ambulatory Visit | Attending: Certified Nurse Midwife | Admitting: Certified Nurse Midwife

## 2023-11-23 ENCOUNTER — Encounter: Payer: Self-pay | Admitting: Certified Nurse Midwife

## 2023-11-23 DIAGNOSIS — O3680X0 Pregnancy with inconclusive fetal viability, not applicable or unspecified: Secondary | ICD-10-CM

## 2023-11-23 DIAGNOSIS — Z3491 Encounter for supervision of normal pregnancy, unspecified, first trimester: Secondary | ICD-10-CM | POA: Diagnosis present

## 2023-11-23 DIAGNOSIS — O26891 Other specified pregnancy related conditions, first trimester: Secondary | ICD-10-CM | POA: Insufficient documentation

## 2023-11-23 DIAGNOSIS — R102 Pelvic and perineal pain: Secondary | ICD-10-CM | POA: Diagnosis not present

## 2023-11-23 DIAGNOSIS — N912 Amenorrhea, unspecified: Secondary | ICD-10-CM | POA: Insufficient documentation

## 2023-11-23 DIAGNOSIS — O368310 Maternal care for abnormalities of the fetal heart rate or rhythm, first trimester, not applicable or unspecified: Secondary | ICD-10-CM | POA: Diagnosis not present

## 2023-11-23 DIAGNOSIS — Z3A01 Less than 8 weeks gestation of pregnancy: Secondary | ICD-10-CM | POA: Insufficient documentation

## 2023-11-27 NOTE — Telephone Encounter (Signed)
 TC to patient, reviewed preliminary ultrasound results. FHR 65-may be early FHR and pregnancy will continue to progress, however viability uncertain at this point and may result in miscarriage. Recommend follow up ultrasound 7-10d after initial. Order placed. Front desk messaged to call patient Monday to schedule.

## 2023-11-28 ENCOUNTER — Other Ambulatory Visit: Payer: Self-pay

## 2023-11-28 ENCOUNTER — Encounter: Payer: Self-pay | Admitting: Emergency Medicine

## 2023-11-28 ENCOUNTER — Emergency Department
Admission: EM | Admit: 2023-11-28 | Discharge: 2023-11-28 | Disposition: A | Discharge status: Home / Self Care | Attending: Emergency Medicine | Admitting: Emergency Medicine

## 2023-11-28 ENCOUNTER — Emergency Department

## 2023-11-28 DIAGNOSIS — O200 Threatened abortion: Secondary | ICD-10-CM | POA: Diagnosis not present

## 2023-11-28 DIAGNOSIS — Z3A01 Less than 8 weeks gestation of pregnancy: Secondary | ICD-10-CM | POA: Insufficient documentation

## 2023-11-28 DIAGNOSIS — O26891 Other specified pregnancy related conditions, first trimester: Secondary | ICD-10-CM | POA: Diagnosis present

## 2023-11-28 DIAGNOSIS — O209 Hemorrhage in early pregnancy, unspecified: Secondary | ICD-10-CM | POA: Diagnosis not present

## 2023-11-28 LAB — CBC
HCT: 38.7 % (ref 36.0–46.0)
Hemoglobin: 12.9 g/dL (ref 12.0–15.0)
MCH: 28.7 pg (ref 26.0–34.0)
MCHC: 33.3 g/dL (ref 30.0–36.0)
MCV: 86 fL (ref 80.0–100.0)
Platelets: 247 K/uL (ref 150–400)
RBC: 4.5 MIL/uL (ref 3.87–5.11)
RDW: 13.7 % (ref 11.5–15.5)
WBC: 9.4 K/uL (ref 4.0–10.5)
nRBC: 0 % (ref 0.0–0.2)

## 2023-11-28 LAB — URINALYSIS, ROUTINE W REFLEX MICROSCOPIC
Bilirubin Urine: NEGATIVE
Glucose, UA: NEGATIVE mg/dL
Hgb urine dipstick: NEGATIVE
Ketones, ur: NEGATIVE mg/dL
Nitrite: NEGATIVE
Protein, ur: 30 mg/dL — AB
Specific Gravity, Urine: 1.029 (ref 1.005–1.030)
pH: 5 (ref 5.0–8.0)

## 2023-11-28 LAB — LIPASE, BLOOD: Lipase: 35 U/L (ref 11–51)

## 2023-11-28 LAB — COMPREHENSIVE METABOLIC PANEL WITH GFR
ALT: 17 U/L (ref 0–44)
AST: 20 U/L (ref 15–41)
Albumin: 3.7 g/dL (ref 3.5–5.0)
Alkaline Phosphatase: 52 U/L (ref 38–126)
Anion gap: 8 (ref 5–15)
BUN: 11 mg/dL (ref 6–20)
CO2: 26 mmol/L (ref 22–32)
Calcium: 9.3 mg/dL (ref 8.9–10.3)
Chloride: 101 mmol/L (ref 98–111)
Creatinine, Ser: 0.52 mg/dL (ref 0.44–1.00)
GFR, Estimated: >60 mL/min (ref >60)
Glucose, Bld: 127 mg/dL — ABNORMAL HIGH (ref 70–99)
Potassium: 3.7 mmol/L (ref 3.5–5.1)
Sodium: 135 mmol/L (ref 135–145)
Total Bilirubin: 0.4 mg/dL (ref 0.0–1.2)
Total Protein: 7.1 g/dL (ref 6.5–8.1)

## 2023-11-28 LAB — POC URINE PREG, ED: Preg Test, Ur: POSITIVE — AB

## 2023-11-28 LAB — HCG, QUANTITATIVE, PREGNANCY: hCG, Beta Chain, Quant, S: 28288 m[IU]/mL — ABNORMAL HIGH (ref <5)

## 2023-11-28 NOTE — ED Triage Notes (Signed)
 Pt via POV from home. Pt c/o lower abd pain for the past week, radiates to her back. Denies any vaginal bleeding. Reports she is [redacted] weeks pregnant. Pt is A&Ox4 and NAD, ambulatory to triage.

## 2023-11-28 NOTE — ED Provider Notes (Signed)
-----------------------------------------   5:14 PM on 11/28/2023 -----------------------------------------  I took over care of this patient from Dr. Arlander.  Ultrasound shows an IUP with normal FHR.  On reassessment the patient is comfortable with no active pain at this time and no bleeding.  She is stable for discharge home.  I counseled her on the results of the workup and plan of care.  I gave strict return precautions, she expressed understanding.  US  OB:  IMPRESSION:  Single live intrauterine pregnancy with an estimated gestational age  of [redacted] weeks, 6 days by ultrasound concordant with age based on LMP.     Jacolyn Pae, MD 11/28/23 [phone]

## 2023-11-28 NOTE — ED Provider Notes (Signed)
 [facility] Provider Note    Event Date/Time   First MD Initiated Contact with Patient 11/28/23 1436     (approximate)   History   Abdominal Pain   HPI  Allison Black is a 28 y.o. female who reports she is approximately [redacted] weeks pregnant, had this confirmed with OB last week, ultrasound at that time noted relatively low heart rate and recommended repeat ultrasound in 10 days or so.  However today she has developed mild cramping sensation in her lower abdomen/pelvis and became concerned so presented to the emergency department.       Physical Exam   Triage Vital Signs: ED Triage Vitals  Encounter Vitals Group     BP 11/28/23 1404 127/84     Girls Systolic BP Percentile --      Girls Diastolic BP Percentile --      Boys Systolic BP Percentile --      Boys Diastolic BP Percentile --      Pulse Rate 11/28/23 1404 90     Resp 11/28/23 1404 18     Temp 11/28/23 1404 98 F (36.7 C)     Temp Source 11/28/23 1404 Tympanic     SpO2 11/28/23 1404 100 %     Weight 11/28/23 1402 86.2 kg (190 lb)     Height 11/28/23 1402 1.575 m (5' 2)     Head Circumference --      Peak Flow --      Pain Score 11/28/23 1402 5     Pain Loc --      Pain Education --      Exclude from Growth Chart --     Most recent vital signs: Vitals:   11/28/23 1404  BP: 127/84  Pulse: 90  Resp: 18  Temp: 98 F (36.7 C)  SpO2: 100%     General: Awake, no distress.  CV:  Good peripheral perfusion.  Resp:  Normal effort.  Abd:  No distention.  Other:     ED Results / Procedures / Treatments   Labs (all labs ordered are listed, but only abnormal results are displayed) Labs Reviewed  COMPREHENSIVE METABOLIC PANEL WITH GFR - Abnormal; Notable for the following components:      Result Value   Glucose, Bld 127 (*)    All other components within normal limits  URINALYSIS, ROUTINE W REFLEX MICROSCOPIC - Abnormal; Notable for the following components:   Color, Urine  YELLOW (*)    APPearance HAZY (*)    Protein, ur 30 (*)    Leukocytes,Ua TRACE (*)    Bacteria, UA RARE (*)    All other components within normal limits  POC URINE PREG, ED - Abnormal; Notable for the following components:   Preg Test, Ur POSITIVE (*)    All other components within normal limits  LIPASE, BLOOD  CBC  HCG, QUANTITATIVE, PREGNANCY     EKG     RADIOLOGY Ultrasound pending    PROCEDURES:  Critical Care performed:   Procedures   MEDICATIONS ORDERED IN ED: Medications - No data to display   IMPRESSION / MDM / ASSESSMENT AND PLAN / ED COURSE  I reviewed the triage vital signs and the nursing notes. Patient's presentation is most consistent with acute presentation with potential threat to life or bodily function.  Patient presents with lower abdominal cramping in the setting of concerning ultrasound on August 18 which demonstrated lower heart rate than expected.  Differential includes miscarriage, threatened miscarriage,  subchorionic hematoma  Overall well-appearing here, no significant vaginal bleeding.  Will send for repeat ultrasound, beta is pending, have asked my colleague to follow-up on ultrasound result        FINAL CLINICAL IMPRESSION(S) / ED DIAGNOSES   Final diagnoses:  Threatened miscarriage     Rx / DC Orders   ED Discharge Orders     None        Note:  This document was prepared using Dragon voice recognition software and may include unintentional dictation errors.   Arlander Charleston, MD 11/28/23 [id number]

## 2023-11-28 NOTE — ED Notes (Signed)
 Pt transported to ultrasound.

## 2023-11-28 NOTE — Discharge Instructions (Signed)
 At this time, your ultrasound is showing a heart rate of 130 bpm and no signs of bleeding.  Follow-up with your OB/GYN.  Return to the ER for new, worsening, or persistent severe abdominal pain or cramping, bleeding, weakness or lightheadedness, or any other new or worsening symptoms that concern you.

## 2023-12-02 ENCOUNTER — Ambulatory Visit

## 2023-12-02 ENCOUNTER — Other Ambulatory Visit: Payer: Self-pay

## 2023-12-02 ENCOUNTER — Telehealth: Payer: Self-pay

## 2023-12-02 VITALS — BP 116/67 | HR 80 | Ht 62.0 in | Wt 193.0 lb

## 2023-12-02 DIAGNOSIS — R350 Frequency of micturition: Secondary | ICD-10-CM

## 2023-12-02 DIAGNOSIS — O219 Vomiting of pregnancy, unspecified: Secondary | ICD-10-CM

## 2023-12-02 DIAGNOSIS — R300 Dysuria: Secondary | ICD-10-CM | POA: Diagnosis not present

## 2023-12-02 LAB — POCT URINALYSIS DIPSTICK
Bilirubin, UA: NEGATIVE
Blood, UA: NEGATIVE
Glucose, UA: NEGATIVE
Ketones, UA: NEGATIVE
Leukocytes, UA: NEGATIVE
Nitrite, UA: NEGATIVE
Protein, UA: NEGATIVE
Spec Grav, UA: 1.01 (ref 1.010–1.025)
Urobilinogen, UA: 0.2 U/dL
pH, UA: 6 (ref 5.0–8.0)

## 2023-12-02 MED ORDER — DOXYLAMINE-PYRIDOXINE 10-10 MG PO TBEC: 2.0000 | DELAYED_RELEASE_TABLET | Freq: Every day | ORAL | 5 refills | Status: DC

## 2023-12-02 MED ORDER — BONJESTA 20-20 MG PO TBCR: 1.0000 | EXTENDED_RELEASE_TABLET | Freq: Two times a day (BID) | ORAL | 3 refills | Status: DC

## 2023-12-02 NOTE — Progress Notes (Signed)
    NURSE VISIT NOTE  Subjective:    Patient ID: Allison Black, female    DOB: 1995/07/28, 28 y.o.   MRN: [id number]       HPI  Patient is a 28 y.o. [id number] female who presents for dysuria and urinary frequency for 1 day.  Patient denies hematuria, urinary urgency, flank pain, abdominal pain, pelvic pain, cloudy malordorous urine, genital rash, genital irritation, and vaginal discharge.  Patient does not have a history of recurrent UTI.  Patient does not have a history of pyelonephritis.    Objective:    BP 116/67   Pulse 80   Ht 5' 2 (1.575 m)   Wt 193 lb (87.5 kg)   LMP 10/10/2023 (Exact Date)   BMI 35.30 kg/m    Lab Review  Results for orders placed or performed in visit on 12/02/23  POCT urinalysis dipstick  Result Value Ref Range   Color, UA     Clarity, UA     Glucose, UA Negative Negative   Bilirubin, UA Negative    Ketones, UA Negative    Spec Grav, UA 1.010 1.010 - 1.025   Blood, UA Negative    pH, UA 6.0 5.0 - 8.0   Protein, UA Negative Negative   Urobilinogen, UA 0.2 0.2 or 1.0 E.U./dL   Nitrite, UA Negative    Leukocytes, UA Negative Negative   Appearance     Odor      Assessment:   1. Urine frequency   2. Nausea/vomiting in pregnancy   3. Dysuria      Plan:   Urine Culture Sent. Bonjesta  sent in for nausea and vomiting.    Harlene Gander, CMA

## 2023-12-02 NOTE — Telephone Encounter (Signed)
 Prior authorization for Bonjesta  initiated via CoverMyMeds.

## 2023-12-04 ENCOUNTER — Ambulatory Visit: Payer: Self-pay

## 2023-12-04 LAB — URINE CULTURE

## 2023-12-07 ENCOUNTER — Ambulatory Visit
Admission: EM | Admit: 2023-12-07 | Discharge: 2023-12-07 | Disposition: A | Discharge status: Home / Self Care | Attending: Emergency Medicine | Admitting: Emergency Medicine

## 2023-12-07 ENCOUNTER — Encounter: Payer: Self-pay | Admitting: Emergency Medicine

## 2023-12-07 DIAGNOSIS — N390 Urinary tract infection, site not specified: Secondary | ICD-10-CM | POA: Diagnosis not present

## 2023-12-07 LAB — URINALYSIS, W/ REFLEX TO CULTURE (INFECTION SUSPECTED)
Bilirubin Urine: NEGATIVE
Glucose, UA: NEGATIVE mg/dL
Ketones, ur: NEGATIVE mg/dL
Leukocytes,Ua: NEGATIVE
Nitrite: NEGATIVE
Protein, ur: NEGATIVE mg/dL
Specific Gravity, Urine: 1.02 (ref 1.005–1.030)
pH: 7 (ref 5.0–8.0)

## 2023-12-07 MED ORDER — CEFTRIAXONE SODIUM 1 G IJ SOLR
1.0000 g | Freq: Once | INTRAMUSCULAR | Status: AC
  Administered 2023-12-07: 1 g via INTRAMUSCULAR

## 2023-12-07 MED ORDER — CEFDINIR 300 MG PO CAPS: 300.0000 mg | ORAL_CAPSULE | Freq: Two times a day (BID) | ORAL | 0 refills | Status: AC

## 2023-12-07 NOTE — Discharge Instructions (Addendum)
 Take the cefdinir  twice daily for 5 days with food for treatment of urinary tract infection.  Increase your oral fluid intake so that you increase your urine production and or flushing your urinary system.  Take an over-the-counter probiotic, such as Culturelle-Align-Activia, 1 hour after each dose of antibiotic to prevent diarrhea or yeast infections from forming.  We will culture urine and change the antibiotics if necessary.  Return for reevaluation, or see your primary care provider, for any new or worsening symptoms.

## 2023-12-07 NOTE — ED Triage Notes (Addendum)
 Patient states that she's had her sx x 1 week. Patient states that she went to her Ob and they tested her urine and it came back negative for anything patient states that they sent it off for culture, but urine was contaminated. Patient states that she is having frequent urination and pressure with urinating. Patient is preg. Also having vaginal irritation. No concern for STD.

## 2023-12-07 NOTE — ED Provider Notes (Signed)
 [facility]    CSN: [id number] Arrival date & time: 12/07/23  1755      History   Chief Complaint Chief Complaint  Patient presents with   Urinary Frequency    HPI Allison Black is a 28 y.o. female.   HPI  28 year old female with past medical history significant for long-term current use of cannabis, panic attacks, heart murmur, bipolar disorder, and asthma presents for evaluation of 1 week worth of urinary symptoms that include urgency and frequency of urination as well as pain at the end of urination.  She describes the pain at the end of urination starting approximately 2 days ago.  She is also complaining of some vaginal irritation but she denies itching or discharge.  Denies concerns for STIs.  Past Medical History:  Diagnosis Date   Asthma    Bipolar disorder (HCC)    Family history of ovarian cancer    5/23 cancer genetic testing letter sent   Heart murmur    Long term current use of cannabis 04/23/2021   Panic attack     Patient Active Problem List   Diagnosis Date Noted   Postpartum care following vaginal delivery 07/29/2023   Labor and delivery indication for care or intervention 07/15/2023   History of macrosomia in infant in prior pregnancy, currently pregnant 07/07/2023   Uterine contractions 07/06/2023   Nausea and vomiting in pregnancy 04/04/2023   History of high cholesterol 03/10/2023   Easy bruising 03/10/2023   Recurrent epistaxis 03/10/2023   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 03/10/2023   Supervision of other normal pregnancy, antepartum 11/24/2022   Bipolar disease during pregnancy, antepartum (HCC) 08/15/2021   Candidal vaginitis 08/08/2021   High risk medication use 06/13/2021   Acute cystitis with hematuria 05/06/2021   At risk for prolonged QT interval syndrome 04/23/2021   Noncompliance with treatment plan 04/23/2021   Bipolar and related disorder (HCC) 03/27/2021   GAD  (generalized anxiety disorder) 03/27/2021   Cannabis use disorder, moderate, in early remission, in controlled environment, dependence (HCC) 03/27/2021   Asthma 02/10/2017   Need for immunization against influenza 01/27/2017   Episodic lightheadedness 12/19/2016   Depression with anxiety 10/09/2016    Past Surgical History:  Procedure Laterality Date   NO PAST SURGERIES      OB History     Gravida  6   Para  2   Term  2   Preterm      AB  2   Living  2      SAB  2   IAB      Ectopic      Multiple  0   Live Births  2            Home Medications    Prior to Admission medications   Medication Sig Start Date End Date Taking? Authorizing Provider  cefdinir  (OMNICEF ) 300 MG capsule Take 1 capsule (300 mg total) by mouth 2 (two) times daily for 5 days. 12/07/23 12/12/23 Yes Bernardino Ditch, NP  Prenatal Vit-Fe Fumarate-FA (PRENATAL PO) Take by mouth.   Yes [provider]  acetaminophen  (TYLENOL ) 325 MG tablet Take 2 tablets (650 mg total) by mouth every 4 (four) hours as needed (for pain scale < 4). 07/17/23   Free, Lauraine PARAS, CNM  albuterol  (VENTOLIN  HFA) 108 (90 Base) MCG/ACT inhaler Inhale 1-2 puffs into the lungs every 6 (six) hours as needed for wheezing or shortness of breath. 12/30/22  Arvis Jolan NOVAK, PA-C  dicloxacillin  (DYNAPEN ) 500 MG capsule Take 1 capsule (500 mg total) by mouth 4 (four) times daily. 07/29/23   Slaughterbeck, Damien, CNM  diphenhydrAMINE  (BENADRYL ) 25 mg capsule Take 25 mg by mouth as needed.    [provider]  Doxylamine -Pyridoxine  (DICLEGIS ) 10-10 MG TBEC Take 2 tablets by mouth at bedtime. If symptoms persist, add one tablet in the morning and one in the afternoon 12/02/23   Slaughterbeck, Damien, CNM  ibuprofen  (ADVIL ) 600 MG tablet Take 1 tablet (600 mg total) by mouth every 6 (six) hours. 07/17/23   Free, Lauraine PARAS, CNM    Family History Family History  Problem Relation Age of Onset   Bipolar disorder Mother    Lupus  Mother    Healthy Father    Mental illness Sister    Asperger's syndrome Sister    Depression Sister    ADD / ADHD Sister    Healthy Brother    Autism Brother    Anxiety disorder Brother    Diabetes Maternal Grandmother    Healthy Maternal Grandfather    Parkinson's disease Paternal Grandmother    Heart failure Paternal Grandmother    Heart Problems Paternal Grandfather    Bipolar disorder Maternal Aunt    Ovarian cancer Maternal Aunt    Bipolar disorder Maternal Aunt    Bipolar disorder Maternal Aunt    Schizophrenia Maternal Aunt    Suicidality Maternal Aunt    Diabetes Cousin     Social History Social History   Tobacco Use   Smoking status: Former    Types: Cigarettes   Smokeless tobacco: Never  Vaping Use   Vaping status: Former  Substance Use Topics   Alcohol use: Not Currently    Alcohol/week: 1.0 standard drink of alcohol    Types: 1 Shots of liquor per week    Comment: 1-2 shots a few times a month   Drug use: Not Currently    Frequency: 28.0 times per week    Types: Marijuana    Comment: stoped with 2024 preg     Allergies   Patient has no known allergies.   Review of Systems Review of Systems  Constitutional:  Negative for fever.  Genitourinary:  Positive for dysuria, frequency and urgency. Negative for hematuria, vaginal discharge and vaginal pain.     Physical Exam Triage Vital Signs ED Triage Vitals  Encounter Vitals Group     BP      Girls Systolic BP Percentile      Girls Diastolic BP Percentile      Boys Systolic BP Percentile      Boys Diastolic BP Percentile      Pulse      Resp      Temp      Temp src      SpO2      Weight      Height      Head Circumference      Peak Flow      Pain Score      Pain Loc      Pain Education      Exclude from Growth Chart    No data found.  Updated Vital Signs BP 119/78 (BP Location: Right Arm)   Pulse 89   Temp 98 F (36.7 C) (Oral)   Resp 18   LMP 10/10/2023 (Exact Date)   SpO2  96%   Visual Acuity Right Eye Distance:   Left Eye Distance:   Bilateral Distance:  Right Eye Near:   Left Eye Near:    Bilateral Near:     Physical Exam Vitals and nursing note reviewed.  Constitutional:      Appearance: Normal appearance. She is not ill-appearing.  HENT:     Head: Normocephalic and atraumatic.  Cardiovascular:     Rate and Rhythm: Normal rate and regular rhythm.     Pulses: Normal pulses.     Heart sounds: Normal heart sounds. No murmur heard.    No friction rub. No gallop.  Pulmonary:     Effort: Pulmonary effort is normal.     Breath sounds: Normal breath sounds. No wheezing, rhonchi or rales.  Abdominal:     Tenderness: There is no right CVA tenderness or left CVA tenderness.  Skin:    General: Skin is warm and dry.     Capillary Refill: Capillary refill takes less than 2 seconds.     Findings: No rash.  Neurological:     General: No focal deficit present.     Mental Status: She is alert and oriented to person, place, and time.      UC Treatments / Results  Labs (all labs ordered are listed, but only abnormal results are displayed) Labs Reviewed  URINALYSIS, W/ REFLEX TO CULTURE (INFECTION SUSPECTED) - Abnormal; Notable for the following components:      Result Value   Color, Urine STRAW (*)    APPearance HAZY (*)    Hgb urine dipstick TRACE (*)    Bacteria, UA FEW (*)    All other components within normal limits  URINE CULTURE  CERVICOVAGINAL ANCILLARY ONLY    EKG   Radiology No results found.  Procedures Procedures (including critical care time)  Medications Ordered in UC Medications  cefTRIAXone  (ROCEPHIN ) injection 1 g (has no administration in time range)    Initial Impression / Assessment and Plan / UC Course  I have reviewed the triage vital signs and the nursing notes.  Pertinent labs & imaging results that were available during my care of the patient were reviewed by me and considered in my medical decision making  (see chart for details).   Patient is a pleasant, nontoxic-appearing 28 year old female presenting for evaluation of genitourinary symptoms as outlined in HPI above.  She is reporting pain at the end of urination that started 2 days ago along with 1 week of urgency and frequency.  She has been evaluated by her OB/GYN and had a negative urine dip.  A culture was sent but it returned result of mixed urogenital flora.  The patient also reports vaginal irritation but she denies itching or discharge.  She is currently pregnant.  I advised the patient that being that she is pregnant this can upset her hormone balance as well as her pH in her vaginal vault and her bacterial balance.  She could be experiencing bacterial vaginosis or vaginal yeast infection even though she is not having any discharge or itching.  For this reason, I advised her that we should collect a cytology swab to assess for both these conditions.  Additionally, I will order urinalysis to assess for the presence of UTI.  Urinalysis has a hazy appearance with trace hemoglobin but is negative for leukocyte esterase, nitrates, protein, ketones, or glucose.  Reflex microscopy shows 6-10 RBCs with few bacteria and amorphous crystals present.  The amorphous crystals present suggest that presence of possible concomitant infection, despite the lack of leukocyte esterase or nitrites.  I will send urine for culture.  I will treat the patient for presumptive UTI with cefdinir  300 mg twice daily for 5 days.  Due to the fact that all the pharmacies are closed I will have staff administer 1 g of ceftriaxone  IM prior to discharge and that she can start her oral antibiotic therapy tomorrow.   Final Clinical Impressions(s) / UC Diagnoses   Final diagnoses:  Lower urinary tract infectious disease     Discharge Instructions      Take the cefdinir  twice daily for 5 days with food for treatment of urinary tract infection.  Increase your oral fluid  intake so that you increase your urine production and or flushing your urinary system.  Take an over-the-counter probiotic, such as Culturelle-Align-Activia, 1 hour after each dose of antibiotic to prevent diarrhea or yeast infections from forming.  We will culture urine and change the antibiotics if necessary.  Return for reevaluation, or see your primary care provider, for any new or worsening symptoms.      ED Prescriptions     Medication Sig Dispense Auth. Provider   cefdinir  (OMNICEF ) 300 MG capsule Take 1 capsule (300 mg total) by mouth 2 (two) times daily for 5 days. 10 capsule Bernardino Ditch, NP      PDMP not reviewed this encounter.   Bernardino Ditch, NP 12/07/23 [phone]

## 2023-12-08 LAB — CERVICOVAGINAL ANCILLARY ONLY
Bacterial Vaginitis (gardnerella): NEGATIVE
Candida Glabrata: NEGATIVE
Candida Vaginitis: NEGATIVE
Chlamydia: NEGATIVE
Comment: NEGATIVE
Comment: NEGATIVE
Comment: NEGATIVE
Comment: NEGATIVE
Comment: NEGATIVE
Comment: NORMAL
Neisseria Gonorrhea: NEGATIVE
Trichomonas: NEGATIVE

## 2023-12-09 ENCOUNTER — Other Ambulatory Visit (HOSPITAL_COMMUNITY)
Admission: RE | Admit: 2023-12-09 | Discharge: 2023-12-09 | Disposition: A | Discharge status: Home / Self Care | Source: Ambulatory Visit | Attending: Certified Nurse Midwife | Admitting: Certified Nurse Midwife

## 2023-12-09 ENCOUNTER — Ambulatory Visit

## 2023-12-09 VITALS — BP 111/77 | HR 82 | Ht 62.0 in | Wt 194.0 lb

## 2023-12-09 DIAGNOSIS — N898 Other specified noninflammatory disorders of vagina: Secondary | ICD-10-CM

## 2023-12-09 LAB — URINE CULTURE

## 2023-12-09 NOTE — Progress Notes (Signed)
    NURSE VISIT NOTE  Subjective:    Patient ID: Allison Black, female    DOB: 09/21/1995, 28 y.o.   MRN: [id number]  HPI  Patient is a 28 y.o. [id number] female who presents for vaginal irritation for 2-3 day(s). Denies abnormal vaginal bleeding or significant pelvic pain or fever. Recently received shot of antibiotics and is on oral antibiotics for UTI currently.   Objective:    BP 111/77   Pulse 82   Ht 5' 2 (1.575 m)   Wt 194 lb (88 kg)   LMP 10/10/2023 (Exact Date)   BMI 35.48 kg/m      Assessment:   1. Vaginal irritation       Plan:   GC and chlamydia DNA  probe sent to lab. Treatment: OTC yeast cream such as Monistat  or Gyne-Lotrimin and await results for further treatment  ROV prn if symptoms persist or worsen.   Waddell JONELLE Maxim, CMA

## 2023-12-10 ENCOUNTER — Ambulatory Visit: Payer: Self-pay | Admitting: Certified Nurse Midwife

## 2023-12-10 DIAGNOSIS — O368390 Maternal care for abnormalities of the fetal heart rate or rhythm, unspecified trimester, not applicable or unspecified: Secondary | ICD-10-CM

## 2023-12-10 LAB — CERVICOVAGINAL ANCILLARY ONLY
Bacterial Vaginitis (gardnerella): NEGATIVE
Candida Glabrata: NEGATIVE
Candida Vaginitis: NEGATIVE
Comment: NEGATIVE
Comment: NEGATIVE
Comment: NEGATIVE

## 2023-12-11 ENCOUNTER — Ambulatory Visit (HOSPITAL_COMMUNITY): Payer: Self-pay

## 2023-12-11 ENCOUNTER — Telehealth

## 2023-12-11 DIAGNOSIS — Z3689 Encounter for other specified antenatal screening: Secondary | ICD-10-CM

## 2023-12-11 DIAGNOSIS — Z3480 Encounter for supervision of other normal pregnancy, unspecified trimester: Secondary | ICD-10-CM | POA: Insufficient documentation

## 2023-12-11 NOTE — Patient Instructions (Signed)
 First Trimester of Pregnancy  The first trimester of pregnancy starts on the first day of your last monthly period until the end of week 13. This is months 1 through 3 of pregnancy. A week after a sperm fertilizes an egg, the egg will implant into the wall of the uterus and begin to develop into a baby. Body changes during your first trimester Your body goes through many changes during pregnancy. The changes usually return to normal after your baby is born. Physical changes Your breasts may grow larger and may hurt. The area around your nipples may get darker. Your periods will stop. Your hair and nails may grow faster. You may pee more often. Health changes You may tire easily. Your gums may bleed and may be sensitive when you brush and floss. You may not feel hungry. You may have heartburn. You may throw up or feel like you may throw up. You may want to eat some foods, but not others. You may have headaches. You may have trouble pooping (constipation). Other changes Your emotions may change from day to day. You may have more dreams. Follow these instructions at home: Medicines Talk to your health care provider if you're taking medicines. Ask if the medicines are safe to take during pregnancy. Your provider may change the medicines that you take. Do not take any medicines unless told to by your provider. Take a prenatal vitamin that has at least 600 micrograms (mcg) of folic acid. Do not use herbal medicines, illegal substances, or medicines that are not approved by your provider. Eating and drinking While you're pregnant your body needs extra food for your growing baby. Talk with your provider about what to eat while pregnant. Activity Most women are able to exercise during pregnancy. Exercises may need to change as your pregnancy goes on. Talk to your provider about your activities and exercise routines. Relieving pain and discomfort Wear a good, supportive bra if your breasts  hurt. Rest with your legs raised if you have leg cramps or low back pain. Safety Wear your seatbelt at all times when you're in a car. Talk to your provider if someone hits you, hurts you, or yells at you. Talk with your provider if you're feeling sad or have thoughts of hurting yourself. Lifestyle Certain things can be harmful while you're pregnant. Follow these rules: Do not use hot tubs, steam rooms, or saunas. Do not douche. Do not use tampons or scented pads. Do not drink alcohol,smoke, vape, or use products with nicotine or tobacco in them. If you need help quitting, talk with your provider. Avoid cat litter boxes and soil used by cats. These things carry germs that can cause harm to your pregnancy and your baby. General instructions Keep all follow-up visits. It helps you and your unborn baby stay as healthy as possible. Write down your questions. Take them to your visits. Your provider will: Talk with you about your overall health. Give you advice or refer you to specialists who can help with different needs, including: Prenatal education classes. Mental health and counseling. Foods and healthy eating. Ask for help if you need help with food. Call your dentist and ask to be seen. Brush your teeth with a soft toothbrush. Floss gently. Where to find more information American Pregnancy Association: americanpregnancy.org [employer]: acog.org Office on [employer] Health: [employer] Contact a health care provider if: You feel dizzy, faint, or have a fever. You vomit or have watery poop (diarrhea) for 2  days or more. You have abnormal discharge or bleeding from your vagina. You have pain when you pee or your pee smells bad. You have cramps, pain, or pressure in your belly area. Get help right away if: You have trouble breathing or chest pain. You have any kind of injury, such as from a fall or a car crash. These symptoms may be an  emergency. Get help right away. Call 911. Do not wait to see if the symptoms will go away. Do not drive yourself to the hospital. This information is not intended to replace advice given to you by your health care provider. Make sure you discuss any questions you have with your health care provider. Document Revised: 12/25/2022 Document Reviewed: 07/25/2022 Elsevier Patient Education  2024 Elsevier Inc.   Common Medications Safe in Pregnancy  Acne:      Constipation:  Benzoyl Peroxide     Colace  Clindamycin      Dulcolax Suppository  Topica Erythromycin     Fibercon  Salicylic Acid      Metamucil         Miralax AVOID:        Senakot   Accutane    Cough:  Retin-A       Cough Drops  Tetracycline      Phenergan w/ Codeine if Rx  Minocycline      Robitussin (Plain & DM)  Antibiotics:     Crabs/Lice:  Ceclor       RID  Cephalosporins    AVOID:  E-Mycins      Kwell  Keflex  Macrobid/Macrodantin   Diarrhea:  Penicillin      Kao-Pectate  Zithromax      Imodium AD         PUSH FLUIDS AVOID:       Cipro     Fever:  Tetracycline      Tylenol (Regular or Extra  Minocycline       Strength)  Levaquin      Extra Strength-Do not          Exceed 8 tabs/24 hrs Caffeine:        200mg /day (equiv. To 1 cup of coffee or  approx. 3 12 oz sodas)         Gas: Cold/Hayfever:       Gas-X  Benadryl      Mylicon  Claritin       Phazyme  **Claritin-D        Chlor-Trimeton    Headaches:  Dimetapp      ASA-Free Excedrin  Drixoral-Non-Drowsy     Cold Compress  Mucinex (Guaifenasin)     Tylenol (Regular or Extra  Sudafed/Sudafed-12 Hour     Strength)  **Sudafed PE Pseudoephedrine   Tylenol Cold & Sinus     Vicks Vapor Rub  Zyrtec  **AVOID if Problems With Blood Pressure         Heartburn: Avoid lying down for at least 1 hour after meals  Aciphex      Maalox     Rash:  Milk of Magnesia     Benadryl    Mylanta       1% Hydrocortisone Cream  Pepcid  Pepcid Complete   Sleep  Aids:  Prevacid      Ambien   Prilosec       Benadryl  Rolaids       Chamomile Tea  Tums (Limit 4/day)     Unisom  Tylenol PM         Warm milk-add vanilla or  Hemorrhoids:       Sugar for taste  Anusol/Anusol H.C.  (RX: Analapram 2.5%)  Sugar Substitutes:  Hydrocortisone OTC     Ok in moderation  Preparation H      Tucks        Vaseline lotion applied to tissue with wiping    Herpes:     Throat:  Acyclovir      Oragel  Famvir  Valtrex     Vaccines:         Flu Shot Leg Cramps:       *Gardasil  Benadryl      Hepatitis A         Hepatitis B Nasal Spray:       Pneumovax  Saline Nasal Spray     Polio Booster         Tetanus Nausea:       Tuberculosis test or PPD  Vitamin B6 25 mg TID   AVOID:    Dramamine      *Gardasil  Emetrol       Live Poliovirus  Ginger Root 250 mg QID    MMR (measles, mumps &  High Complex Carbs @ Bedtime    rebella)  Sea Bands-Accupressure    Varicella (Chickenpox)  Unisom 1/2 tab TID     *No known complications           If received before Pain:         Known pregnancy;   Darvocet       Resume series after  Lortab        Delivery  Percocet    Yeast:   Tramadol      Femstat  Tylenol 3      Gyne-lotrimin  Ultram       Monistat  Vicodin           MISC:         All Sunscreens           Hair Coloring/highlights          Insect Repellant's          (Including DEET)         Mystic Tans   Commonly Asked Questions During Pregnancy   Cats: A parasite can be excreted in cat feces.  To avoid exposure you need to have another person empty the little box.  If you must empty the litter box you will need to wear gloves.  Wash your hands after handling your cat.  This parasite can also be found in raw or undercooked meat so this should also be avoided.  Colds, Sore Throats, Flu: Please check your medication sheet to see what you can take for symptoms.  If your symptoms are unrelieved by these medications please call the office.  Dental Work: Most  any dental work Agricultural consultant recommends is permitted.  X-rays should only be taken during the first trimester if absolutely necessary.  Your abdomen should be shielded with a lead apron during all x-rays.  Please notify your provider prior to receiving any x-rays.  Novocaine is fine; gas is not recommended.  If your dentist requires a note from [place] prior to dental work please call the office and we will provide one for you.  Exercise: Exercise is an important part of staying healthy during your pregnancy.  You may continue most exercises you were accustomed to prior to pregnancy.  Later in your pregnancy you will most likely notice you have difficulty with activities requiring balance like riding a bicycle.  It is important that you listen to your body and avoid activities that put you at a higher risk of falling.  Adequate rest and staying well hydrated are a must!  If you have questions about the safety of specific activities ask your provider.    Exposure to Children with illness: Try to avoid obvious exposure; report any symptoms to [place] when noted,  If you have chicken pos, red measles or mumps, you should be immune to these diseases.   Please do not take any vaccines while pregnant unless you have checked with your OB provider.  Fetal Movement: After 28 weeks we recommend you do "kick counts" twice daily.  Lie or sit down in a calm quiet environment and count your baby movements "kicks".  You should feel your baby at least 10 times per hour.  If you have not felt 10 kicks within the first hour get up, walk around and have something sweet to eat or drink then repeat for an additional hour.  If count remains less than 10 per hour notify your provider.  Fumigating: Follow your pest control agent's advice as to how long to stay out of your home.  Ventilate the area well before re-entering.  Hemorrhoids:   Most over-the-counter preparations can be used during pregnancy.  Check your medication to see what is  safe to use.  It is important to use a stool softener or fiber in your diet and to drink lots of liquids.  If hemorrhoids seem to be getting worse please call the office.   Hot Tubs:  Hot tubs Jacuzzis and saunas are not recommended while pregnant.  These increase your internal body temperature and should be avoided.  Intercourse:  Sexual intercourse is safe during pregnancy as long as you are comfortable, unless otherwise advised by your provider.  Spotting may occur after intercourse; report any bright red bleeding that is heavier than spotting.  Labor:  If you know that you are in labor, please go to the hospital.  If you are unsure, please call the office and let us help you decide what to do.  Lifting, straining, etc:  If your job requires heavy lifting or straining please check with your provider for any limitations.  Generally, you should not lift items heavier than that you can lift simply with your hands and arms (no back muscles)  Painting:  Paint fumes do not harm your pregnancy, but may make you ill and should be avoided if possible.  Latex or water based paints have less odor than oils.  Use adequate ventilation while painting.  Permanents & Hair Color:  Chemicals in hair dyes are not recommended as they cause increase hair dryness which can increase hair loss during pregnancy.  " Highlighting" and permanents are allowed.  Dye may be absorbed differently and permanents may not hold as well during pregnancy.  Sunbathing:  Use a sunscreen, as skin burns easily during pregnancy.  Drink plenty of fluids; avoid over heating.  Tanning Beds:  Because their possible side effects are still unknown, tanning beds are not recommended.  Ultrasound Scans:  Routine ultrasounds are performed at approximately 20 weeks.  You will be able to see your baby's general anatomy an if you would like to know the gender this can usually be determined as well.  If it is questionable when you conceived you may  also  receive an ultrasound early in your pregnancy for dating purposes.  Otherwise ultrasound exams are not routinely performed unless there is a medical necessity.  Although you can request a scan we ask that you pay for it when conducted because insurance does not cover " patient request" scans.  Work: If your pregnancy proceeds without complications you may work until your due date, unless your physician or employer advises otherwise.  Round Ligament Pain/Pelvic Discomfort:  Sharp, shooting pains not associated with bleeding are fairly common, usually occurring in the second trimester of pregnancy.  They tend to be worse when standing up or when you remain standing for long periods of time.  These are the result of pressure of certain pelvic ligaments called "round ligaments".  Rest, Tylenol and heat seem to be the most effective relief.  As the womb and fetus grow, they rise out of the pelvis and the discomfort improves.  Please notify the office if your pain seems different than that described.  It may represent a more serious condition.

## 2023-12-11 NOTE — Progress Notes (Signed)
 New OB Intake  I connected with  Allison Black on 12/11/23 at 10:15 AM EDT by MyChart Video Visit and verified that I am speaking with the correct person using two identifiers. Nurse is located at [facility] and pt is located at an urgent care with daughter who is sick.  I discussed the limitations, risks, security and privacy concerns of performing an evaluation and management service by telephone and the availability of in person appointments. I also discussed with the patient that there may be a patient responsible charge related to this service. The patient expressed understanding and agreed to proceed.  I explained I am completing New OB Intake today. We discussed her EDD of 07/16/24 that is based on LMP of 10/10/23. Pt is G6/P3. I reviewed her allergies, medications, Medical/Surgical/OB history, and appropriate screenings. There are cats in the home: no. Based on history, this is a/an pregnancy uncomplicated . Her obstetrical history is significant for N/A.  Patient Active Problem List   Diagnosis Date Noted   Supervision of other normal pregnancy, antepartum 12/11/2023   History of macrosomia in infant in prior pregnancy, currently pregnant 07/07/2023   History of high cholesterol 03/10/2023   Easy bruising 03/10/2023   Recurrent epistaxis 03/10/2023   Bipolar disease during pregnancy, antepartum (HCC) 08/15/2021   High risk medication use 06/13/2021   At risk for prolonged QT interval syndrome 04/23/2021   Bipolar and related disorder (HCC) 03/27/2021   GAD (generalized anxiety disorder) 03/27/2021   Cannabis use disorder, moderate, in early remission, in controlled environment, dependence (HCC) 03/27/2021   Asthma 02/10/2017   Episodic lightheadedness 12/19/2016   Depression with anxiety 10/09/2016    Concerns addressed today: None  Delivery Plans:  Plans to deliver at [facility].  Anatomy US  Explained Anatomy US  will be scheduled around [redacted] weeks  gestational age.  Labs Discussed genetic screening with patient. Patient desires genetic testing to be drawn at new OB visit. Discussed possible labs to be drawn at new OB appointment.  COVID Vaccine Patient has had 2 COVID vaccines.   Social Determinants of Health Food Insecurity: denies food insecurity  Transportation: Patient denies transportation needs. Childcare: Discussed no children allowed at ultrasound appointments.   First visit review I reviewed new OB appt with pt. I explained she will have blood work and pap smear/pelvic exam if indicated. Explained pt will be seen by Harlene Cisco, CNM at first visit; encounter routed to appropriate provider.   Beola Skeens, CMA 12/11/2023  10:34 AM

## 2023-12-15 ENCOUNTER — Encounter: Payer: Self-pay | Admitting: Family Medicine

## 2023-12-15 ENCOUNTER — Ambulatory Visit (INDEPENDENT_AMBULATORY_CARE_PROVIDER_SITE_OTHER): Admitting: Family Medicine

## 2023-12-15 VITALS — BP 111/55 | HR 105 | Ht 62.0 in | Wt 193.0 lb

## 2023-12-15 DIAGNOSIS — Z0000 Encounter for general adult medical examination without abnormal findings: Secondary | ICD-10-CM

## 2023-12-15 DIAGNOSIS — J209 Acute bronchitis, unspecified: Secondary | ICD-10-CM | POA: Diagnosis not present

## 2023-12-15 DIAGNOSIS — J45909 Unspecified asthma, uncomplicated: Secondary | ICD-10-CM | POA: Diagnosis not present

## 2023-12-15 DIAGNOSIS — J4521 Mild intermittent asthma with (acute) exacerbation: Secondary | ICD-10-CM

## 2023-12-15 DIAGNOSIS — Z0001 Encounter for general adult medical examination with abnormal findings: Secondary | ICD-10-CM

## 2023-12-15 MED ORDER — BUDESONIDE-FORMOTEROL FUMARATE 80-4.5 MCG/ACT IN AERO: 2.0000 | INHALATION_SPRAY | Freq: Two times a day (BID) | RESPIRATORY_TRACT | 12 refills | Status: DC

## 2023-12-15 MED ORDER — AMOXICILLIN-POT CLAVULANATE 875-125 MG PO TABS: 1.0000 | ORAL_TABLET | Freq: Two times a day (BID) | ORAL | 0 refills | Status: DC

## 2023-12-15 NOTE — Progress Notes (Signed)
 Complete physical exam   Patient: Allison Black   DOB: 09/08/1995   28 y.o. Female  MRN: [id number] Visit Date: 12/15/2023  Today's healthcare provider: LAURAINE LOISE BUOY, DO   Chief Complaint  Patient presents with   Annual Exam    [redacted] week pregnant,    Subjective    Allison Black is a 28 y.o. female who presents today for a complete physical exam.   HPI HPI     Annual Exam    Additional comments: [redacted] week pregnant,       Last edited by Thelbert Eulalio HERO, CMA on 12/15/2023  2:04 PM.      Allison Black is a 28 year old female with asthma who presents with chest tightness and concerns about medication use during pregnancy.  She is experiencing chest tightness and suspects bronchitis, noting increased use of her albuterol  inhaler over the past two weeks. Last night, she experienced significant chest heaviness and describes a sensation of mucus being stuck in her chest, typical for her when she has bronchitis due to her asthma. She feels winded more quickly than usual.  She is currently nine weeks pregnant and is concerned about the safety of medications during her first trimester. She uses Benadryl  for nausea and has been prescribed a combination of Unisom  and B6 for nausea, which she has obtained. She has avoided a medicated inhaler due to concerns about side effects like thrush but is considering it due to increased symptoms. She frequently experiences bronchitis, often requiring steroids, which she is concerned about using during pregnancy.  She recently completed a five-day course of antibiotics for a urinary tract infection diagnosed about a week ago. Symptoms have improved, but she experienced frequent urination and irritation prior to treatment.  She has a history of six pregnancies, including two miscarriages. Currently pregnant with her fourth child, she has experienced significant weight gain, attributing it to not losing weight while breastfeeding her  previous child. She reports swelling in her ankles, which she also experienced in her last pregnancy.    Past Medical History:  Diagnosis Date   Asthma    Bipolar disorder (HCC)    Family history of ovarian cancer    5/23 cancer genetic testing letter sent   Heart murmur    Long term current use of cannabis 04/23/2021   Panic attack    Past Surgical History:  Procedure Laterality Date   NO PAST SURGERIES     Social History   Socioeconomic History   Marital status: Married    Spouse name: Allison Black   Number of children: 3   Years of education: 12   Highest education level: 12th grade  Occupational History   Occupation: Forensic psychologist  Tobacco Use   Smoking status: Former    Types: Cigarettes   Smokeless tobacco: Never  Vaping Use   Vaping status: Former  Substance and Sexual Activity   Alcohol use: Not Currently    Alcohol/week: 1.0 standard drink of alcohol    Types: 1 Shots of liquor per week    Comment: 1-2 shots a few times a month   Drug use: Not Currently    Frequency: 28.0 times per week    Types: Marijuana    Comment: stoped with 2024 preg   Sexual activity: Yes    Partners: Male    Birth control/protection: None  Other Topics Concern   Not on file  Social History Narrative   Son -  Allison Black    Daughter - Allison Black   Social Drivers of Corporate investment banker Strain: Low Risk  (12/09/2023)   Overall Financial Resource Strain (CARDIA)    Difficulty of Paying Living Expenses: Not hard at all  Food Insecurity: No Food Insecurity (12/09/2023)   Hunger Vital Sign    Worried About Running Out of Food in the Last Year: Never true    Ran Out of Food in the Last Year: Never true  Transportation Needs: No Transportation Needs (12/09/2023)   PRAPARE - Administrator, Civil Service (Medical): No    Lack of Transportation (Non-Medical): No  Physical Activity: Insufficiently Active (12/09/2023)   Exercise Vital Sign    Days of Exercise per Week: 3  days    Minutes of Exercise per Session: 30 min  Stress: No Stress Concern Present (12/09/2023)   [employer] of Occupational Health - Occupational Stress Questionnaire    Feeling of Stress: Only a little  Social Connections: Moderately Isolated (12/09/2023)   Social Connection and Isolation Panel    Frequency of Communication with Friends and Family: Twice a week    Frequency of Social Gatherings with Friends and Family: Once a week    Attends Religious Services: Never    Database administrator or Organizations: No    Attends Engineer, structural: Not on file    Marital Status: Married  Catering manager Violence: Not At Risk (11/19/2023)   Humiliation, Afraid, Rape, and Kick questionnaire    Fear of Current or Ex-Partner: No    Emotionally Abused: No    Physically Abused: No    Sexually Abused: No   Family Status  Relation Name Status   Mother  Alive   Father  Alive   Sister  Alive   Sister  Alive   Sister  Alive   Sister not sure Alive   Brother  Alive   Brother  Alive   MGM  Alive   MGF  Alive   PGM  Alive   PGF  Alive   Mat Aunt  Alive   Mat Aunt  Alive   Mat Aunt  Alive   Mat Aunt  Deceased   Cousin not sure of all dsxs Alive  No partnership data on file   Family History  Problem Relation Age of Onset   Bipolar disorder Mother    Lupus Mother    Healthy Father    Mental illness Sister    Asperger's syndrome Sister    Depression Sister    ADD / ADHD Sister    Healthy Brother    Autism Brother    Anxiety disorder Brother    Diabetes Maternal Grandmother    Healthy Maternal Grandfather    Parkinson's disease Paternal Grandmother    Heart failure Paternal Grandmother    Heart Problems Paternal Grandfather    Bipolar disorder Maternal Aunt    Ovarian cancer Maternal Aunt        early, maybe 30s   Bipolar disorder Maternal Aunt    Bipolar disorder Maternal Aunt    Schizophrenia Maternal Aunt    Suicidality Maternal Aunt    Diabetes Cousin     No Known Allergies  Patient Care Team: Josefita Weissmann, Lauraine SAILOR, DO as PCP - General (Family Medicine)   Medications: Outpatient Medications Prior to Visit  Medication Sig   acetaminophen  (TYLENOL ) 325 MG tablet Take 2 tablets (650 mg total) by mouth every 4 (four) hours as needed (for pain  scale < 4).   albuterol  (VENTOLIN  HFA) 108 (90 Base) MCG/ACT inhaler Inhale 1-2 puffs into the lungs every 6 (six) hours as needed for wheezing or shortness of breath.   diphenhydrAMINE  (BENADRYL ) 25 mg capsule Take 25 mg by mouth as needed.   Doxylamine -Pyridoxine  (DICLEGIS ) 10-10 MG TBEC Take 2 tablets by mouth at bedtime. If symptoms persist, add one tablet in the morning and one in the afternoon   Prenatal Vit-Fe Fumarate-FA (PRENATAL PO) Take by mouth.   No facility-administered medications prior to visit.    Review of Systems  Constitutional:  Negative for chills, fatigue and fever.  HENT:  Negative for congestion, ear pain, rhinorrhea, sneezing and sore throat.   Eyes: Negative.  Negative for pain and redness.  Respiratory:  Positive for chest tightness. Negative for cough, shortness of breath and wheezing.   Cardiovascular:  Negative for chest pain and leg swelling.  Gastrointestinal:  Negative for abdominal pain, blood in stool, constipation, diarrhea and nausea.  Endocrine: Negative for polydipsia and polyphagia.  Genitourinary: Negative.  Negative for dysuria, flank pain, hematuria, pelvic pain, vaginal bleeding and vaginal discharge.  Musculoskeletal:  Negative for arthralgias, back pain, gait problem and joint swelling.  Skin:  Negative for rash.  Neurological: Negative.  Negative for dizziness, tremors, seizures, weakness, light-headedness, numbness and headaches.  Hematological:  Negative for adenopathy.  Psychiatric/Behavioral: Negative.  Negative for behavioral problems, confusion and dysphoric mood. The patient is not nervous/anxious and is not hyperactive.        Objective    BP  (!) 111/55 (BP Location: Right Arm, Patient Position: Sitting, Cuff Size: Normal)   Pulse (!) 105   Ht 5' 2 (1.575 m)   Wt 193 lb (87.5 kg)   LMP 10/10/2023 (Exact Date)   SpO2 98%   BMI 35.30 kg/m    Physical Exam Vitals and nursing note reviewed.  Constitutional:      General: She is awake.     Appearance: Normal appearance. She is obese.  HENT:     Head: Normocephalic and atraumatic.     Right Ear: Tympanic membrane, ear canal and external ear normal. There is no impacted cerumen.     Left Ear: Tympanic membrane, ear canal and external ear normal. There is no impacted cerumen.     Nose: Nose normal.     Mouth/Throat:     Mouth: Mucous membranes are moist.     Pharynx: Oropharynx is clear. No oropharyngeal exudate or posterior oropharyngeal erythema.  Eyes:     General: No scleral icterus.    Extraocular Movements: Extraocular movements intact.     Conjunctiva/sclera: Conjunctivae normal.     Pupils: Pupils are equal, round, and reactive to light.  Neck:     Thyroid : No thyromegaly or thyroid  tenderness.  Cardiovascular:     Rate and Rhythm: Normal rate and regular rhythm.     Pulses: Normal pulses.     Heart sounds: Normal heart sounds.  Pulmonary:     Effort: Pulmonary effort is normal. No tachypnea, bradypnea or respiratory distress.     Breath sounds: No stridor. Examination of the right-lower field reveals rhonchi. Examination of the left-lower field reveals rhonchi. Rhonchi present. No wheezing or rales.  Abdominal:     General: Bowel sounds are normal. There is no distension.     Palpations: Abdomen is soft. There is no mass.     Tenderness: There is no abdominal tenderness. There is no guarding.     Hernia: No hernia  is present.  Musculoskeletal:     Cervical back: Normal range of motion and neck supple.     Right lower leg: No edema.     Left lower leg: No edema.  Lymphadenopathy:     Cervical: No cervical adenopathy.  Skin:    General: Skin is warm and  dry.  Neurological:     Mental Status: She is alert and oriented to person, place, and time. Mental status is at baseline.  Psychiatric:        Mood and Affect: Mood normal.        Behavior: Behavior normal.      Last depression screening scores    12/15/2023    2:58 PM 11/19/2023    4:33 PM 03/10/2023   10:35 AM  PHQ 2/9 Scores  PHQ - 2 Score 1 0 0  PHQ- 9 Score 7  0   Last fall risk screening    06/24/2023    9:05 AM  Fall Risk   Falls in the past year? 0  Number falls in past yr: 0  Injury with Fall? 0  Risk for fall due to : No Fall Risks  Follow up Falls evaluation completed   Last Audit-C alcohol use screening    12/09/2023    9:02 AM  Alcohol Use Disorder Test (AUDIT)  1. How often do you have a drink containing alcohol? 0  2. How many drinks containing alcohol do you have on a typical day when you are drinking? 0  3. How often do you have six or more drinks on one occasion? 0  AUDIT-C Score 0      Patient-reported   A score of 3 or more in women, and 4 or more in men indicates increased risk for alcohol abuse, EXCEPT if all of the points are from question 1   No results found for any visits on 12/15/23.  Assessment & Plan    Routine Health Maintenance and Physical Exam  Exercise Activities and Dietary recommendations  Goals   None     Immunization History  Administered Date(s) Administered   Influenza,inj,Quad PF,6+ Mos 01/27/2017, 01/06/2018   Influenza-Unspecified 01/25/2020   PFIZER(Purple Top)SARS-COV-2 Vaccination 12/13/2019, 01/10/2020   PPD Test 05/22/2022   Tdap 01/27/2017, 04/30/2023    Health Maintenance  Topic Date Due   Pneumococcal Vaccine (1 of 2 - PCV) Never done   Hepatitis B Vaccines 19-59 Average Risk (1 of 3 - 19+ 3-dose series) Never done   HPV VACCINES (1 - 3-dose SCDM series) Never done   Influenza Vaccine  11/06/2023   COVID-19 Vaccine (3 - Pfizer risk series) 04/06/2025 (Originally 02/07/2020)   Cervical Cancer Screening  (Pap smear)  08/26/2026   DTaP/Tdap/Td (3 - Td or Tdap) 04/29/2033   Hepatitis C Screening  Completed   HIV Screening  Completed   Meningococcal B Vaccine  Aged Out    Discussed health benefits of physical activity, and encouraged her to engage in regular exercise appropriate for her age and condition.   Annual physical exam  Bronchitis with asthma, acute -     Amoxicillin -Pot Clavulanate; Take 1 tablet by mouth 2 (two) times daily.  Dispense: 20 tablet; Refill: 0 -     Budesonide -Formoterol  Fumarate; Inhale 2 puffs into the lungs 2 (two) times daily.  Dispense: 1 each; Refill: 12  Mild intermittent asthma with acute exacerbation     Annual physical exam Adult visit focused on multiple concerns, including annual exam, pregnancy, asthma, and medication safety.  Physical exam overall unremarkable except as noted above. Routine lab work ordered as noted.    Pregnancy, first trimester First trimester confirmed. Discussed medication safety, avoiding Mucinex and Sudafed. Safe alternatives include Benadryl  and Unisom  with B6 (diclegis ). Emphasized rest and hydration.  Acute bronchitis with asthma, in pregnancy  Acute bronchitis with asthma exacerbation. Discussed potential use of antibiotics and steroids, ideally avoided during pregnancy. Amoxicillin  considered safe. - Prescribe amoxicillin /clauvulanate. - Encourage rest and increased fluid intake. - Use albuterol  inhaler as needed. - Prescribe Symbicort  with plan to consult with pharmacist prior to initiating.   Asthma Asthma exacerbated by acute bronchitis. Discussed inhaled corticosteroids during pregnancy, emphasizing safety of inhaled over systemic administration and benefit of avoiding need for systemic steroids. Consideration of Symbicort , pending pharmacist consultation, per patient preference. - Prescribe Symbicort , low dose, twice daily, pending pharmacist consultation. - Use albuterol  inhaler as needed.    Return in about  1 year (around 12/14/2024), or if symptoms worsen or fail to improve, for CPE.     I discussed the assessment and treatment plan with the patient  The patient was provided an opportunity to ask questions and all were answered. The patient agreed with the plan and demonstrated an understanding of the instructions.   The patient was advised to call back or seek an in-person evaluation if the symptoms worsen or if the condition fails to improve as anticipated.    LAURAINE LOISE BUOY, DO  [facility] [phone] (phone) [phone] (fax)  [facility]

## 2023-12-24 ENCOUNTER — Other Ambulatory Visit

## 2023-12-30 ENCOUNTER — Other Ambulatory Visit

## 2024-01-01 ENCOUNTER — Ambulatory Visit

## 2024-01-01 DIAGNOSIS — O368390 Maternal care for abnormalities of the fetal heart rate or rhythm, unspecified trimester, not applicable or unspecified: Secondary | ICD-10-CM

## 2024-01-01 DIAGNOSIS — Z3A11 11 weeks gestation of pregnancy: Secondary | ICD-10-CM

## 2024-01-01 DIAGNOSIS — O368310 Maternal care for abnormalities of the fetal heart rate or rhythm, first trimester, not applicable or unspecified: Secondary | ICD-10-CM | POA: Diagnosis not present

## 2024-01-01 NOTE — Progress Notes (Signed)
 NEW OB HISTORY AND PHYSICAL  SUBJECTIVE:       Allison Black is a 28 y.o. [phone] female, Patient's last menstrual period was 10/10/2023 (exact date)., Estimated Date of Delivery: 07/16/24, [redacted]w[redacted]d, presents today for establishment of Prenatal Care. She reports fatigue, nausea-no vomiting & it taking diclegis . 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 Exercise: not regular Substance use: denies T/E/D  Indications for ASA therapy (per uptodate) One of the following: Previous pregnancy with preeclampsia, especially early onset and with an adverse outcome No Multifetal gestation No Chronic hypertension No Type 1 or 2 diabetes mellitus No Chronic kidney disease No Autoimmune disease (antiphospholipid syndrome, systemic lupus erythematosus) No  Two or more of the following: Nulliparity No Obesity (body mass index >30 kg/m2) Yes Family history of preeclampsia in mother or sister No Age >=35 years No Sociodemographic characteristics (African American race, low socioeconomic level) No Personal risk factors (eg, previous pregnancy with low birth weight or small for gestational age infant, previous adverse pregnancy outcome [eg, stillbirth], interval >10 years between pregnancies) No   Gynecologic History Patient's last menstrual period was 10/10/2023 (exact date). Normal Contraception: none Last Pap: 08/26/23. Results were: normal  Obstetric History OB History  Gravida Para Term Preterm AB Living  6 3 3  2 3   SAB IAB Ectopic Multiple Live Births  2   0 3    # Outcome Date GA Lbr Len/2nd Weight Sex Type Anes PTL Lv  6 Current           5  Term 07/15/23   8 lb 12 oz (3.969 kg) F Vag-Spont   LIV  4 SAB 09/19/21          3 Term 03/27/17 [redacted]w[redacted]d / 00:25 9 lb 8.7 oz (4.33 kg) F Vag-Spont EPI  LIV  2 SAB 03/2015     SAB     1 Term 05/07/13 [redacted]w[redacted]d  9 lb (4.082 kg) M Vag-Spont  N LIV    Past Medical History:  Diagnosis Date   Asthma    Bipolar disorder (HCC)    Family history of ovarian cancer    5/23 cancer genetic testing letter sent   Heart murmur    Long term current use of cannabis 04/23/2021   Panic attack     Past Surgical History:  Procedure Laterality Date   NO PAST SURGERIES      Current Outpatient Medications on File Prior to Visit  Medication Sig Dispense Refill   acetaminophen  (TYLENOL ) 325 MG tablet Take 2 tablets (650 mg total) by mouth every 4 (four) hours as needed (for pain scale < 4).     albuterol  (VENTOLIN  HFA) 108 (90 Base) MCG/ACT inhaler Inhale 1-2 puffs into the lungs every 6 (six) hours as needed for wheezing or shortness of breath. 1 g 1   Prenatal Vit-Fe Fumarate-FA (PRENATAL PO) Take by mouth.     amoxicillin -clavulanate (AUGMENTIN ) 875-125 MG tablet Take 1 tablet by mouth 2 (two) times daily. 20 tablet 0   budesonide -formoterol  (SYMBICORT ) 80-4.5 MCG/ACT inhaler Inhale 2 puffs into the lungs 2 (two) times daily. (Patient not taking: Reported on 01/04/2024) 1 each 12   diphenhydrAMINE  (BENADRYL ) 25 mg capsule Take 25 mg  by mouth as needed.     Doxylamine -Pyridoxine  (DICLEGIS ) 10-10 MG TBEC Take 2 tablets by mouth at bedtime. If symptoms persist, add one tablet in the morning and one in the afternoon (Patient not taking: Reported on 01/04/2024) 100 tablet 5   No current facility-administered medications on file prior to visit.    No Known Allergies  Social History   Socioeconomic History   Marital status: Married    Spouse name: [name]   Number of children: 3   Years of education: 12   Highest education level: 12th grade  Occupational History   Occupation: Forensic psychologist   Tobacco Use   Smoking status: Former    Types: Cigarettes   Smokeless tobacco: Never  Vaping Use   Vaping status: Former  Substance and Sexual Activity   Alcohol use: Not Currently    Alcohol/week: 1.0 standard drink of alcohol    Types: 1 Shots of liquor per week    Comment: 1-2 shots a few times a month   Drug use: Not Currently    Frequency: 28.0 times per week    Types: Marijuana    Comment: stoped with 2024 preg   Sexual activity: Yes    Partners: Male    Birth control/protection: None  Other Topics Concern   Not on file  Social History Narrative   Son - [name]    Daughter - [name]   Social Drivers of Health   Financial Resource Strain: Low Risk  (12/09/2023)   Overall Financial Resource Strain (CARDIA)    Difficulty of Paying Living Expenses: Not hard at all  Food Insecurity: No Food Insecurity (12/09/2023)   Hunger Vital Sign    Worried About Running Out of Food in the Last Year: Never true    Ran Out of Food in the Last Year: Never true  Transportation Needs: No Transportation Needs (12/09/2023)   PRAPARE - Administrator, Civil Service (Medical): No    Lack of Transportation (Non-Medical): No  Physical Activity: Insufficiently Active (12/09/2023)   Exercise Vital Sign    Days of Exercise per Week: 3 days    Minutes of Exercise per Session: 30 min  Stress: No Stress Concern Present (12/09/2023)   [employer] of Occupational Health - Occupational Stress Questionnaire    Feeling of Stress: Only a little  Social Connections: Moderately Isolated (12/09/2023)   Social Connection and Isolation Panel    Frequency of Communication with Friends and Family: Twice a week    Frequency of Social Gatherings with Friends and Family: Once a week    Attends Religious Services: Never    Database administrator or Organizations: No    Attends Engineer, structural: Not on file    Marital Status: Married  Catering manager Violence: Not At Risk (11/19/2023)    Humiliation, Afraid, Rape, and Kick questionnaire    Fear of Current or Ex-Partner: No    Emotionally Abused: No    Physically Abused: No    Sexually Abused: No    Family History  Problem Relation Age of Onset   Bipolar disorder Mother    Lupus Mother    Healthy Father    Mental illness Sister    Asperger's syndrome Sister    Depression Sister    ADD / ADHD Sister    Healthy Brother    Autism Brother    Anxiety disorder Brother    Diabetes Maternal Grandmother    Healthy Maternal Grandfather  Parkinson's disease Paternal Grandmother    Heart failure Paternal Grandmother    Heart Problems Paternal Grandfather    Bipolar disorder Maternal Aunt    Ovarian cancer Maternal Aunt        early, maybe 30s   Bipolar disorder Maternal Aunt    Bipolar disorder Maternal Aunt    Schizophrenia Maternal Aunt    Suicidality Maternal Aunt    Diabetes Cousin     The following portions of the patient's history were reviewed and updated as appropriate: allergies, current medications, past OB history, past medical history, past surgical history, past family history, past social history, and problem list.  Constitutional: Denied constitutional symptoms, night sweats, recent illness, fatigue, fever, insomnia and weight loss.  Eyes: Denied eye symptoms, eye pain, photophobia, vision change and visual disturbance.  Ears/Nose/Throat/Neck: Denied ear, nose, throat or neck symptoms, hearing loss, nasal discharge, sinus congestion and sore throat.  Cardiovascular: Denied cardiovascular symptoms, arrhythmia, chest pain/pressure, edema, exercise intolerance, orthopnea and palpitations.  Respiratory: Denied pulmonary symptoms, asthma, pleuritic pain, productive sputum, cough, dyspnea and wheezing.  Gastrointestinal: Denied gastro-esophageal reflux, melena, nausea and vomiting.  Genitourinary: Denied genitourinary symptoms including symptomatic vaginal discharge, pelvic relaxation issues, and urinary  complaints.  Musculoskeletal: Denied musculoskeletal symptoms, stiffness, swelling, muscle weakness and myalgia.  Dermatologic: Denied dermatology symptoms, rash and scar.  Neurologic: Denied neurology symptoms, dizziness, headache, neck pain and syncope.  Psychiatric: Denied psychiatric symptoms, anxiety and depression.  Endocrine: Denied endocrine symptoms including hot flashes and night sweats.     OBJECTIVE: Initial Physical Exam (New OB)  Physical Exam Vitals reviewed.  Constitutional:      General: She is not in acute distress.    Appearance: Normal appearance.  HENT:     Head: Normocephalic.  Neck:     Thyroid : No thyroid  mass or thyromegaly.  Cardiovascular:     Rate and Rhythm: Normal rate and regular rhythm.     Heart sounds: Normal heart sounds.  Pulmonary:     Effort: Pulmonary effort is normal.     Breath sounds: Normal breath sounds.  Chest:  Breasts:    Tanner Score is 5.     Right: No inverted nipple, mass or tenderness.     Left: No inverted nipple, mass or tenderness.  Abdominal:     Palpations: Abdomen is soft.     Tenderness: There is no abdominal tenderness.  Musculoskeletal:     Cervical back: Neck supple. No tenderness.  Skin:    General: Skin is warm and dry.  Neurological:     General: No focal deficit present.     Mental Status: She is alert and oriented to person, place, and time.  Psychiatric:        Mood and Affect: Mood and affect normal.        Behavior: Behavior normal. Behavior is cooperative.     Fetal Heart Rate (bpm):  (+cardiac activity on bedside u/s)  ASSESSMENT: Normal pregnancy   PLAN: Routine prenatal care. We discussed an overview of prenatal care and when to call. Reviewed diet, exercise, and weight gain recommendations in pregnancy. Discussed benefits of breastfeeding and lactation resources at [facility]. I reviewed labs and answered all questions. ASA not recommended based on history. Hx of macrosomia & shoulder  dystocia (no newborn injury), reviewed that primary Cesarean delivery is recommended for history of shoulder dystocia with injury to baby and that shoulder dystocia may happen again. Declines primary scheduled Cesarean delivery. Increased activity encouraged given hx of excess weight  gain in [place]'s pregnancy and has not lost most of weight gained in that pregnancy.  1. [redacted] weeks gestation of pregnancy (Primary)  2. Supervision of other normal pregnancy, antepartum - NOB Panel; Future - Culture, OB Urine - Monitor Drug Profile 14(MW) - Nicotine  screen, urine - Urinalysis, Routine w reflex microscopic - Comprehensive metabolic panel - Hemoglobin A1c - Protein / creatinine ratio, urine - TSH + free T4 - PANORAMA PRENATAL TEST - Cervicovaginal ancillary only  3. Encounter for drug screening - Monitor Drug Profile 14(MW) - Nicotine  screen, urine  4. Exposure to cat feces, initial encounter  5. Genetic screening  6. Immunity status testing - NOB Panel; Future  7. Screening examination for STD (sexually transmitted disease) - NOB Panel; Future  8. Screening for iron deficiency anemia - NOB Panel; Future  9. Obesity affecting pregnancy, antepartum, unspecified obesity type - Comprehensive metabolic panel - Hemoglobin A1c - TSH + free T4  10. Encounter for supervision of other normal pregnancy in first trimester - PANORAMA PRENATAL TEST  11. Short interval between pregnancies affecting pregnancy, antepartum  12. History of macrosomia in infant in prior pregnancy, currently pregnant  13. History of shoulder dystocia in prior pregnancy, currently pregnant   Harlene LITTIE Cisco, CNM

## 2024-01-04 ENCOUNTER — Ambulatory Visit: Admitting: Certified Nurse Midwife

## 2024-01-04 ENCOUNTER — Other Ambulatory Visit: Payer: Self-pay | Admitting: Certified Nurse Midwife

## 2024-01-04 ENCOUNTER — Other Ambulatory Visit (HOSPITAL_COMMUNITY)
Admission: RE | Admit: 2024-01-04 | Discharge: 2024-01-04 | Disposition: A | Discharge status: Home / Self Care | Source: Ambulatory Visit | Attending: Certified Nurse Midwife | Admitting: Certified Nurse Midwife

## 2024-01-04 VITALS — BP 115/60 | HR 82 | Wt 193.8 lb

## 2024-01-04 DIAGNOSIS — Z3480 Encounter for supervision of other normal pregnancy, unspecified trimester: Secondary | ICD-10-CM

## 2024-01-04 DIAGNOSIS — Z0184 Encounter for antibody response examination: Secondary | ICD-10-CM

## 2024-01-04 DIAGNOSIS — O99210 Obesity complicating pregnancy, unspecified trimester: Secondary | ICD-10-CM

## 2024-01-04 DIAGNOSIS — Z1379 Encounter for other screening for genetic and chromosomal anomalies: Secondary | ICD-10-CM

## 2024-01-04 DIAGNOSIS — Z3481 Encounter for supervision of other normal pregnancy, first trimester: Secondary | ICD-10-CM | POA: Diagnosis not present

## 2024-01-04 DIAGNOSIS — T7589XA Other specified effects of external causes, initial encounter: Secondary | ICD-10-CM

## 2024-01-04 DIAGNOSIS — Z0283 Encounter for blood-alcohol and blood-drug test: Secondary | ICD-10-CM

## 2024-01-04 DIAGNOSIS — O09899 Supervision of other high risk pregnancies, unspecified trimester: Secondary | ICD-10-CM | POA: Insufficient documentation

## 2024-01-04 DIAGNOSIS — O09299 Supervision of pregnancy with other poor reproductive or obstetric history, unspecified trimester: Secondary | ICD-10-CM | POA: Insufficient documentation

## 2024-01-04 DIAGNOSIS — Z3A12 12 weeks gestation of pregnancy: Secondary | ICD-10-CM

## 2024-01-04 DIAGNOSIS — Z130 Encounter for screening for diseases of the blood and blood-forming organs and certain disorders involving the immune mechanism: Secondary | ICD-10-CM

## 2024-01-04 DIAGNOSIS — Z113 Encounter for screening for infections with a predominantly sexual mode of transmission: Secondary | ICD-10-CM

## 2024-01-05 ENCOUNTER — Encounter: Payer: Self-pay | Admitting: Certified Nurse Midwife

## 2024-01-05 LAB — COMPREHENSIVE METABOLIC PANEL WITH GFR
ALT: 16 IU/L (ref 0–32)
AST: 16 IU/L (ref 0–40)
Albumin: 4.1 g/dL (ref 4.0–5.0)
Alkaline Phosphatase: 74 IU/L (ref 41–116)
BUN/Creatinine Ratio: 21 (ref 9–23)
BUN: 10 mg/dL (ref 6–20)
Bilirubin Total: <0.2 mg/dL (ref 0.0–1.2)
CO2: 21 mmol/L (ref 20–29)
Calcium: 9.4 mg/dL (ref 8.7–10.2)
Chloride: 99 mmol/L (ref 96–106)
Creatinine, Ser: 0.47 mg/dL — ABNORMAL LOW (ref 0.57–1.00)
Globulin, Total: 2.6 g/dL (ref 1.5–4.5)
Glucose: 80 mg/dL (ref 70–99)
Potassium: 4.4 mmol/L (ref 3.5–5.2)
Sodium: 135 mmol/L (ref 134–144)
Total Protein: 6.7 g/dL (ref 6.0–8.5)
eGFR: 132 mL/min/1.73 (ref >59)

## 2024-01-05 LAB — URINALYSIS, ROUTINE W REFLEX MICROSCOPIC
Bilirubin, UA: NEGATIVE
Glucose, UA: NEGATIVE
Nitrite, UA: NEGATIVE
RBC, UA: NEGATIVE
Specific Gravity, UA: 1.028 (ref 1.005–1.030)
Urobilinogen, Ur: 0.2 mg/dL (ref 0.2–1.0)
pH, UA: 7 (ref 5.0–7.5)

## 2024-01-05 LAB — TSH+FREE T4
Free T4: 0.82 ng/dL (ref 0.82–1.77)
TSH: 0.831 u[IU]/mL (ref 0.450–4.500)

## 2024-01-05 LAB — PROTEIN / CREATININE RATIO, URINE
Creatinine, Urine: 154.8 mg/dL
Protein, Ur: 15.2 mg/dL
Protein/Creat Ratio: 98 mg/g{creat} (ref 0–200)

## 2024-01-05 LAB — CERVICOVAGINAL ANCILLARY ONLY
Chlamydia: NEGATIVE
Comment: NEGATIVE
Comment: NORMAL
Neisseria Gonorrhea: NEGATIVE

## 2024-01-05 LAB — MICROSCOPIC EXAMINATION
Casts: NONE SEEN /LPF
Epithelial Cells (non renal): >10 /HPF — AB (ref 0–10)
WBC, UA: NONE SEEN /HPF (ref 0–5)

## 2024-01-05 LAB — HEMOGLOBIN A1C
Est. average glucose Bld gHb Est-mCnc: 108 mg/dL
Hgb A1c MFr Bld: 5.4 % (ref 4.8–5.6)

## 2024-01-06 LAB — MONITOR DRUG PROFILE 14(MW)
Amphetamine Scrn, Ur: NEGATIVE ng/mL
BARBITURATE SCREEN URINE: NEGATIVE ng/mL
BENZODIAZEPINE SCREEN, URINE: NEGATIVE ng/mL
Buprenorphine, Urine: NEGATIVE ng/mL
CANNABINOIDS UR QL SCN: NEGATIVE ng/mL
Cocaine (Metab) Scrn, Ur: NEGATIVE ng/mL
Creatinine(Crt), U: 161.8 mg/dL (ref 20.0–300.0)
Fentanyl, Urine: NEGATIVE pg/mL
Meperidine Screen, Urine: NEGATIVE ng/mL
Methadone Screen, Urine: NEGATIVE ng/mL
OXYCODONE+OXYMORPHONE UR QL SCN: NEGATIVE ng/mL
Opiate Scrn, Ur: NEGATIVE ng/mL
Ph of Urine: 6.8 (ref 4.5–8.9)
Phencyclidine Qn, Ur: NEGATIVE ng/mL
Propoxyphene Scrn, Ur: NEGATIVE ng/mL
SPECIFIC GRAVITY: 1.025
Tramadol Screen, Urine: NEGATIVE ng/mL

## 2024-01-06 LAB — NICOTINE SCREEN, URINE: Cotinine Ql Scrn, Ur: NEGATIVE ng/mL

## 2024-01-06 LAB — CULTURE, OB URINE

## 2024-01-06 LAB — URINE CULTURE, OB REFLEX

## 2024-01-10 LAB — PANORAMA PRENATAL TEST FULL PANEL:PANORAMA TEST PLUS 5 ADDITIONAL MICRODELETIONS: FETAL FRACTION: 5.7

## 2024-01-11 ENCOUNTER — Emergency Department
Admission: EM | Admit: 2024-01-11 | Discharge: 2024-01-11 | Disposition: A | Discharge status: Home / Self Care | Attending: Emergency Medicine | Admitting: Emergency Medicine

## 2024-01-11 ENCOUNTER — Ambulatory Visit: Payer: Self-pay | Admitting: Certified Nurse Midwife

## 2024-01-11 ENCOUNTER — Emergency Department

## 2024-01-11 ENCOUNTER — Other Ambulatory Visit: Payer: Self-pay

## 2024-01-11 DIAGNOSIS — Z3A01 Less than 8 weeks gestation of pregnancy: Secondary | ICD-10-CM | POA: Diagnosis not present

## 2024-01-11 DIAGNOSIS — O208 Other hemorrhage in early pregnancy: Secondary | ICD-10-CM | POA: Diagnosis not present

## 2024-01-11 DIAGNOSIS — O4691 Antepartum hemorrhage, unspecified, first trimester: Secondary | ICD-10-CM | POA: Diagnosis not present

## 2024-01-11 DIAGNOSIS — O209 Hemorrhage in early pregnancy, unspecified: Secondary | ICD-10-CM | POA: Diagnosis not present

## 2024-01-11 DIAGNOSIS — Z3A13 13 weeks gestation of pregnancy: Secondary | ICD-10-CM | POA: Diagnosis not present

## 2024-01-11 DIAGNOSIS — O468X2 Other antepartum hemorrhage, second trimester: Secondary | ICD-10-CM

## 2024-01-11 LAB — URINALYSIS, ROUTINE W REFLEX MICROSCOPIC
Bilirubin Urine: NEGATIVE
Glucose, UA: NEGATIVE mg/dL
Hgb urine dipstick: NEGATIVE
Ketones, ur: NEGATIVE mg/dL
Nitrite: NEGATIVE
Protein, ur: NEGATIVE mg/dL
Specific Gravity, Urine: 1.028 (ref 1.005–1.030)
pH: 6 (ref 5.0–8.0)

## 2024-01-11 LAB — CBC WITH DIFFERENTIAL/PLATELET
Abs Immature Granulocytes: 0.04 K/uL (ref 0.00–0.07)
Basophils Absolute: 0.1 K/uL (ref 0.0–0.1)
Basophils Relative: 1 %
Eosinophils Absolute: 0.2 K/uL (ref 0.0–0.5)
Eosinophils Relative: 2 %
HCT: 36.3 % (ref 36.0–46.0)
Hemoglobin: 12.1 g/dL (ref 12.0–15.0)
Immature Granulocytes: 0 %
Lymphocytes Relative: 25 %
Lymphs Abs: 2.4 K/uL (ref 0.7–4.0)
MCH: 28.3 pg (ref 26.0–34.0)
MCHC: 33.3 g/dL (ref 30.0–36.0)
MCV: 84.8 fL (ref 80.0–100.0)
Monocytes Absolute: 0.6 K/uL (ref 0.1–1.0)
Monocytes Relative: 6 %
Neutro Abs: 6.7 K/uL (ref 1.7–7.7)
Neutrophils Relative %: 66 %
Platelets: 259 K/uL (ref 150–400)
RBC: 4.28 MIL/uL (ref 3.87–5.11)
RDW: 14.1 % (ref 11.5–15.5)
WBC: 10 K/uL (ref 4.0–10.5)
nRBC: 0 % (ref 0.0–0.2)

## 2024-01-11 LAB — POC URINE PREG, ED: Preg Test, Ur: POSITIVE — AB

## 2024-01-11 LAB — ABO/RH: ABO/RH(D): B POS

## 2024-01-11 LAB — HCG, QUANTITATIVE, PREGNANCY: hCG, Beta Chain, Quant, S: 62421 m[IU]/mL — ABNORMAL HIGH (ref <5)

## 2024-01-11 NOTE — ED Notes (Signed)
 Patient in US , US  stated they would bring to room 49 when done with patient.

## 2024-01-11 NOTE — ED Triage Notes (Signed)
 Patient ambulatory to triage with complaints of vaginal bleeding/spotting while currently [redacted] weeks pregnant. Spotting started since Thursday, also endorses feeling bloated and crampy. Recent pregnancy in April as well. Patient is concerned for miscarriage as she's had 2 before, this is her 6th total pregnancy. Last US  was Sept 19th and it was read normal.

## 2024-01-11 NOTE — ED Notes (Signed)
 Returned from U/S

## 2024-01-11 NOTE — ED Provider Notes (Signed)
 [facility] Emergency Department Provider Note     Event Date/Time   First MD Initiated Contact with Patient 01/11/24 2102     (approximate)   History   Vaginal Bleeding   HPI  Allison Black is a 28 y.o. female G6P3 with a history of Miscarriages, presents to the ED endorsing vaginal staining and spotting with onset since last week.  Describes and shows pictures of what appears to be dark brown discharge on the toilet tissue.  She denies any copious drainage at this time and no need for panty liner.  Patient is approximately 13 weeks with a single IUP confirmed with most recent ultrasound at [redacted] weeks gestation.  By her admission, she is highly anxious because of her previous 1st and 2nd trimester miscarriages.   Physical Exam   Triage Vital Signs: ED Triage Vitals  Encounter Vitals Group     BP 01/11/24 1915 110/68     Girls Systolic BP Percentile --      Girls Diastolic BP Percentile --      Boys Systolic BP Percentile --      Boys Diastolic BP Percentile --      Pulse Rate 01/11/24 1915 80     Resp 01/11/24 1915 18     Temp 01/11/24 1915 98.4 F (36.9 C)     Temp Source 01/11/24 1915 Oral     SpO2 01/11/24 1915 100 %     Weight 01/11/24 1915 193 lb (87.5 kg)     Height 01/11/24 1915 5' 2 (1.575 m)     Head Circumference --      Peak Flow --      Pain Score 01/11/24 1924 5     Pain Loc --      Pain Education --      Exclude from Growth Chart --     Most recent vital signs: Vitals:   01/11/24 1915  BP: 110/68  Pulse: 80  Resp: 18  Temp: 98.4 F (36.9 C)  SpO2: 100%    General Awake, no distress.   HEENT NCAT. PERRL. EOMI. No rhinorrhea. Mucous membranes are moist. * CV:  Good peripheral perfusion.  RESP:  Normal effort.  GYN:  deferred   ED Results / Procedures / Treatments   Labs (all labs ordered are listed, but only abnormal results are displayed) Labs Reviewed  URINALYSIS, ROUTINE W REFLEX MICROSCOPIC - Abnormal;  Notable for the following components:      Result Value   Color, Urine YELLOW (*)    APPearance HAZY (*)    Leukocytes,Ua TRACE (*)    Bacteria, UA RARE (*)    All other components within normal limits  HCG, QUANTITATIVE, PREGNANCY - Abnormal; Notable for the following components:   hCG, Beta Chain, Quant, S 62,421 (*)    All other components within normal limits  POC URINE PREG, ED - Abnormal; Notable for the following components:   Preg Test, Ur POSITIVE (*)    All other components within normal limits  URINE CULTURE  CBC WITH DIFFERENTIAL/PLATELET  ABO/RH    EKG   RADIOLOGY  I personally viewed and evaluated these images as part of my medical decision making, as well as reviewing the written report by the radiologist.  ED Provider Interpretation: Single IUP at 13 weeks 2 days, with a small subchorionic hemorrhage noted  US  OB Comp Less 14 Wks Result Date: 01/11/2024 CLINICAL DATA:  Bleeding during early pregnancy. Estimated gestational age by  LMP is 13 weeks 2 days. Quantitative beta HCG is 62,421. EXAM: OBSTETRIC <14 WK ULTRASOUND TECHNIQUE: Transabdominal ultrasound was performed for evaluation of the gestation as well as the maternal uterus and adnexal regions. COMPARISON:  01/01/2024 FINDINGS: Intrauterine gestational sac: A single intrauterine gestational sac is present. Yolk sac: Yolk sac is not identified consistent with gestational age. Embryo:  Fetal pole is present. Cardiac Activity: Fetal cardiac activity is observed. Heart Rate: 164 bpm CRL:   71.9 mm   13 w 2 d                  US  EDC: 07/16/2024 Subchorionic hemorrhage: A small subchorionic hemorrhage is demonstrated posteriorly. Maternal uterus/adnexae: No myometrial mass lesions identified. Both ovaries are visualized and appear normal. No free fluid demonstrated. IMPRESSION: Single intrauterine pregnancy with estimated gestational age [redacted] weeks 2 days. This represents appropriate interval growth since the previous  study. A small subchorionic hemorrhage is identified. Electronically Signed   By: Elsie Gravely M.D.   On: 01/11/2024 21:42    PROCEDURES:  Critical Care performed: No  Procedures   MEDICATIONS ORDERED IN ED: Medications - No data to display   IMPRESSION / MDM / ASSESSMENT AND PLAN / ED COURSE  I reviewed the triage vital signs and the nursing notes.                              Differential diagnosis includes, but is not limited to, threatened miscarriage, incomplete miscarriage, normal bleeding from an early trimester pregnancy, ectopic pregnancy, , blighted ovum, vaginal/cervical trauma, subchorionic hemorrhage/hematoma, etc.  Patient's presentation is most consistent with acute complicated illness / injury requiring diagnostic workup.  Patient's diagnosis is consistent with scant vaginal bleeding in second trimester, with evidence of subchorionic hemorrhage.  Patient presents in no acute distress, endorsing some intermittent episodes of dark brown staining on toilet tissue after wiping.  She denies any significant cramping or abnormal frank vaginal bleeding.  She is reassured by her workup at this time noting a robust beta quant of 64,000+ patient is also with ultrasound confirms IUP at 13 weeks 2 days with a small subchorionic hemorrhage.  UA showing rare bacteria, will be sent for culture at this time.  Patient will await follow-up with her OB for need for antibiotic management.  Patient is to follow up with [facility] as discussed, as needed or otherwise directed. Patient is given ED precautions to return to the ED for any worsening or new symptoms.   FINAL CLINICAL IMPRESSION(S) / ED DIAGNOSES   Final diagnoses:  Subchorionic hematoma in second trimester, single or unspecified fetus     Rx / DC Orders   ED Discharge Orders     None        Note:  This document was prepared using Dragon voice recognition software and may include unintentional dictation errors.     Loyd Candida LULLA Aldona, PA-C 01/11/24 2333    Levander Slate, MD 01/12/24 [phone]

## 2024-01-11 NOTE — Discharge Instructions (Signed)
 Your exam, labs, and ultrasound are normal and reassuring. It shows a normally developing pregnancy at 13 weeks and 2 days. You do have evidence of a small subchorionic hemorrhage. This is the reason for scant amount spotting. Follow-up with your [facility] provider as scheduled.

## 2024-01-13 ENCOUNTER — Other Ambulatory Visit: Payer: Self-pay

## 2024-01-13 LAB — URINE CULTURE
Culture: <10000 — AB
Special Requests: NORMAL

## 2024-01-15 ENCOUNTER — Other Ambulatory Visit: Payer: Self-pay | Admitting: Genetic Counselor

## 2024-01-15 DIAGNOSIS — Z006 Encounter for examination for normal comparison and control in clinical research program: Secondary | ICD-10-CM

## 2024-01-16 ENCOUNTER — Ambulatory Visit
Admission: EM | Admit: 2024-01-16 | Discharge: 2024-01-16 | Disposition: A | Discharge status: Home / Self Care | Attending: Physician Assistant | Admitting: Physician Assistant

## 2024-01-16 ENCOUNTER — Encounter: Payer: Self-pay | Admitting: Emergency Medicine

## 2024-01-16 DIAGNOSIS — R300 Dysuria: Secondary | ICD-10-CM | POA: Insufficient documentation

## 2024-01-16 DIAGNOSIS — N898 Other specified noninflammatory disorders of vagina: Secondary | ICD-10-CM | POA: Diagnosis not present

## 2024-01-16 DIAGNOSIS — Z3A14 14 weeks gestation of pregnancy: Secondary | ICD-10-CM | POA: Insufficient documentation

## 2024-01-16 LAB — URINALYSIS, W/ REFLEX TO CULTURE (INFECTION SUSPECTED)
Glucose, UA: NEGATIVE mg/dL
Leukocytes,Ua: NEGATIVE
Nitrite: NEGATIVE
Protein, ur: 30 mg/dL — AB
Specific Gravity, Urine: >1.03 — ABNORMAL HIGH (ref 1.005–1.030)
pH: 6 (ref 5.0–8.0)

## 2024-01-16 NOTE — ED Provider Notes (Signed)
 [facility]    CSN: [id number] Arrival date & time: 01/16/24  1506      History   Chief Complaint Chief Complaint  Patient presents with   Dysuria    HPI Allison Black is a 28 y.o. female who is currently [redacted] weeks pregnant.  Medical history significant for asthma, bipolar disorder, and anxiety.   Patient presents today for dysuria for the past couple of days. Also reports vaginal irritation.   Denies fever, fatigue, abdominal pain, pelvic pain, flank pain, hematuria, vaginal discharge, and vaginal odor.   Patient has history of recurrent urinary symptoms with negative cultures or multiple species suggesting need for recollection.  She was treated 5 weeks ago for suspected UTI with cefdinir .  Culture came back with multiple species.  Of note, patient was diagnosed with a subchorionic hematoma in second trimester when she was seen in the ED.  She is being followed by OB/GYN and has an appointment upcoming in the next couple weeks.  HPI  Past Medical History:  Diagnosis Date   Asthma    Bipolar disorder (HCC)    Family history of ovarian cancer    5/23 cancer genetic testing letter sent   Heart murmur    Long term current use of cannabis 04/23/2021   Panic attack     Patient Active Problem List   Diagnosis Date Noted   Short interval between pregnancies affecting pregnancy, antepartum 01/04/2024   History of shoulder dystocia in prior pregnancy, currently pregnant 01/04/2024   Supervision of other normal pregnancy, antepartum 12/11/2023   History of macrosomia in infant in prior pregnancy, currently pregnant 07/07/2023   History of high cholesterol 03/10/2023   Easy bruising 03/10/2023   Recurrent epistaxis 03/10/2023   Bipolar disease during pregnancy, antepartum (HCC) 08/15/2021   High risk medication use 06/13/2021   At risk for prolonged QT interval syndrome 04/23/2021   Bipolar and related disorder (HCC) 03/27/2021   GAD (generalized anxiety  disorder) 03/27/2021   Cannabis use disorder, moderate, in early remission, in controlled environment, dependence (HCC) 03/27/2021   Asthma 02/10/2017   Episodic lightheadedness 12/19/2016   Depression with anxiety 10/09/2016    Past Surgical History:  Procedure Laterality Date   NO PAST SURGERIES      OB History     Gravida  6   Para  3   Term  3   Preterm      AB  2   Living  3      SAB  2   IAB      Ectopic      Multiple  0   Live Births  3            Home Medications    Prior to Admission medications   Medication Sig Start Date End Date Taking? Authorizing Provider  acetaminophen  (TYLENOL ) 325 MG tablet Take 2 tablets (650 mg total) by mouth every 4 (four) hours as needed (for pain scale < 4). 07/17/23   Free, Lauraine PARAS, CNM  albuterol  (VENTOLIN  HFA) 108 (90 Base) MCG/ACT inhaler Inhale 1-2 puffs into the lungs every 6 (six) hours as needed for wheezing or shortness of breath. 12/30/22   Arvis Jolan NOVAK, PA-C  amoxicillin -clavulanate (AUGMENTIN ) 875-125 MG tablet Take 1 tablet by mouth 2 (two) times daily. 12/15/23   Donzella Lauraine SAILOR, DO  budesonide -formoterol  (SYMBICORT ) 80-4.5 MCG/ACT inhaler Inhale 2 puffs into the lungs 2 (two) times daily. Patient not taking: No sig reported 12/15/23  Donzella Lauraine SAILOR, DO  diphenhydrAMINE  (BENADRYL ) 25 mg capsule Take 25 mg by mouth as needed.    [provider]  Doxylamine -Pyridoxine  (DICLEGIS ) 10-10 MG TBEC Take 2 tablets by mouth at bedtime. If symptoms persist, add one tablet in the morning and one in the afternoon Patient not taking: Reported on 01/04/2024 12/02/23   Slaughterbeck, Damien, CNM  Prenatal Vit-Fe Fumarate-FA (PRENATAL PO) Take by mouth.    [provider]    Family History Family History  Problem Relation Age of Onset   Bipolar disorder Mother    Lupus Mother    Healthy Father    Mental illness Sister    Asperger's syndrome Sister    Depression Sister    ADD / ADHD Sister     Healthy Brother    Autism Brother    Anxiety disorder Brother    Diabetes Maternal Grandmother    Healthy Maternal Grandfather    Parkinson's disease Paternal Grandmother    Heart failure Paternal Grandmother    Heart Problems Paternal Grandfather    Bipolar disorder Maternal Aunt    Ovarian cancer Maternal Aunt        early, maybe 30s   Bipolar disorder Maternal Aunt    Bipolar disorder Maternal Aunt    Schizophrenia Maternal Aunt    Suicidality Maternal Aunt    Diabetes Cousin     Social History Social History   Tobacco Use   Smoking status: Former    Types: Cigarettes   Smokeless tobacco: Never  Vaping Use   Vaping status: Former  Substance Use Topics   Alcohol use: Not Currently    Alcohol/week: 1.0 standard drink of alcohol    Types: 1 Shots of liquor per week    Comment: 1-2 shots a few times a month   Drug use: Not Currently    Frequency: 28.0 times per week    Types: Marijuana    Comment: stoped with 2024 preg     Allergies   Patient has no known allergies.   Review of Systems Review of Systems  Constitutional:  Negative for chills, fatigue and fever.  Gastrointestinal:  Negative for abdominal pain, diarrhea, nausea and vomiting.  Genitourinary:  Positive for dysuria. Negative for decreased urine volume, flank pain, frequency, hematuria, pelvic pain, urgency, vaginal bleeding, vaginal discharge and vaginal pain.  Musculoskeletal:  Negative for back pain.  Skin:  Negative for rash.     Physical Exam Triage Vital Signs ED Triage Vitals  Encounter Vitals Group     BP      Girls Systolic BP Percentile      Girls Diastolic BP Percentile      Boys Systolic BP Percentile      Boys Diastolic BP Percentile      Pulse      Resp      Temp      Temp src      SpO2      Weight      Height      Head Circumference      Peak Flow      Pain Score      Pain Loc      Pain Education      Exclude from Growth Chart    No data found.  Updated Vital  Signs BP 115/72 (BP Location: Right Arm)   Pulse 76   Temp 97.6 F (36.4 C) (Oral)   Resp 14   Ht 5' 2 (1.575 m)   Wt  192 lb 14.4 oz (87.5 kg)   LMP 10/10/2023 (Exact Date)   SpO2 96%   Breastfeeding No   BMI 35.28 kg/m       Physical Exam Vitals and nursing note reviewed.  Constitutional:      General: She is not in acute distress.    Appearance: Normal appearance. She is not ill-appearing or toxic-appearing.  HENT:     Head: Normocephalic and atraumatic.  Eyes:     General: No scleral icterus.       Right eye: No discharge.        Left eye: No discharge.     Conjunctiva/sclera: Conjunctivae normal.  Cardiovascular:     Rate and Rhythm: Normal rate and regular rhythm.     Heart sounds: Normal heart sounds.  Pulmonary:     Effort: Pulmonary effort is normal. No respiratory distress.     Breath sounds: Normal breath sounds.  Abdominal:     Palpations: Abdomen is soft.     Tenderness: There is no abdominal tenderness. There is no right CVA tenderness or left CVA tenderness.  Musculoskeletal:     Cervical back: Neck supple.  Skin:    General: Skin is dry.  Neurological:     General: No focal deficit present.     Mental Status: She is alert. Mental status is at baseline.     Motor: No weakness.     Gait: Gait normal.  Psychiatric:        Mood and Affect: Mood normal.        Behavior: Behavior normal.      UC Treatments / Results  Labs (all labs ordered are listed, but only abnormal results are displayed) Labs Reviewed  URINALYSIS, W/ REFLEX TO CULTURE (INFECTION SUSPECTED) - Abnormal; Notable for the following components:      Result Value   Specific Gravity, Urine >1.030 (*)    Hgb urine dipstick MODERATE (*)    Bilirubin Urine SMALL (*)    Ketones, ur TRACE (*)    Protein, ur 30 (*)    Bacteria, UA FEW (*)    All other components within normal limits  URINE CULTURE  CERVICOVAGINAL ANCILLARY ONLY    EKG   Radiology No results  found.  Procedures Procedures (including critical care time)  Medications Ordered in UC Medications - No data to display  Initial Impression / Assessment and Plan / UC Course  I have reviewed the triage vital signs and the nursing notes.  Pertinent labs & imaging results that were available during my care of the patient were reviewed by me and considered in my medical decision making (see chart for details).   28 y/o pregnant (14 week) female presents for 2 day history of dysuria and vaginal irritation.  Vitals stable and normal.  No abdominal tenderness or CVA tenderness.  UA obtained.  Greater than 1.030 specific gravity, moderate hemoglobin, small bili, ketones and protein.  Sample appears to be contaminated with 6-10 squamous epithelial cells.  Will have patient recollect after explaining how to properly do this myself.  Nursing staff explained it the first time but patient did not collect appropriately.  Will send for culture.  If culture is positive we will treat for UTI.  Also having patient swab for BV and yeast.  If positive will treat since she is pregnant.  At this time encouraged her to increase fluid intake as she is a bit dehydrated.  Patient advised to go to ED if any heavy vaginal  bleeding, dizziness, fever, fatigue, abdominal/pelvic pain.   Final Clinical Impressions(s) / UC Diagnoses   Final diagnoses:  Dysuria  Vaginal irritation  [redacted] weeks gestation of pregnancy     Discharge Instructions      - The urine test does not appear to be consistent with a UTI. - We are sending the urine for culture and we will call you if there is bacteria.  In that case we will send an antibiotic for you. - You also obtained a vaginal swab to check for BV and yeast.  We will call you if those results are positive and treat accordingly since you are pregnant.  As discussed any urinary infection or vaginal infection should be treated in pregnancy even if asymptomatic as it can lead to  increased risk of pregnancy complications. - Increase your fluid intake.  You are a little dehydrated.     ED Prescriptions   None    PDMP not reviewed this encounter.   Arvis Jolan NOVAK, PA-C 01/16/24 1610

## 2024-01-16 NOTE — Discharge Instructions (Addendum)
-   The urine test does not appear to be consistent with a UTI. - We are sending the urine for culture and we will call you if there is bacteria.  In that case we will send an antibiotic for you. - You also obtained a vaginal swab to check for BV and yeast.  We will call you if those results are positive and treat accordingly since you are pregnant.  As discussed any urinary infection or vaginal infection should be treated in pregnancy even if asymptomatic as it can lead to increased risk of pregnancy complications. - Increase your fluid intake.  You are a little dehydrated.

## 2024-01-16 NOTE — ED Triage Notes (Signed)
 Patient reports irritation and burning when urinating for the past 2 days.  Patient denies fevers.  Patient denies N/V.  Patient denies any vaginal bleeding.  Patient is [redacted] weeks pregnant.

## 2024-01-19 ENCOUNTER — Ambulatory Visit (HOSPITAL_COMMUNITY): Payer: Self-pay

## 2024-01-19 LAB — CERVICOVAGINAL ANCILLARY ONLY
Bacterial Vaginitis (gardnerella): NEGATIVE
Candida Glabrata: NEGATIVE
Candida Vaginitis: NEGATIVE
Comment: NEGATIVE
Comment: NEGATIVE
Comment: NEGATIVE

## 2024-01-19 LAB — URINE CULTURE: Culture: NO GROWTH

## 2024-01-27 NOTE — Progress Notes (Signed)
    Return Prenatal Note   Subjective   28 y.o. H3E6976 at [redacted]w[redacted]d presents for this follow-up prenatal visit.  Patient feeling well, no further episodes of bleeding/brown discharge. Not yet feeling movement. Desires AFP. Patient reports: Movement: Absent Contractions: Not present  Objective   Flow sheet Vitals: Pulse Rate: 99 BP: 106/63 Fetal Heart Rate (bpm): 155 Total weight gain: 10 lb 12.8 oz (4.899 kg)  General Appearance  No acute distress, well appearing, and well nourished Pulmonary   Normal work of breathing Neurologic   Alert and oriented to person, place, and time Psychiatric   Mood and affect within normal limits   Assessment/Plan   Plan  28 y.o. H3E6976 at [redacted]w[redacted]d presents for follow-up OB visit. Reviewed prenatal record including previous visit note.  Supervision of other normal pregnancy, antepartum Red flag warning signs reviewed. Encouraged increased water intake, consider one electrolyte beverage daily. Anatomy ultrasound ordered. AFP collected.      Orders Placed This Encounter  Procedures   US  OB Comp + 14 Wk    Standing Status:   Future    Expected Date:   02/29/2024    Expiration Date:   01/26/2025    Reason for Exam (SYMPTOM  OR DIAGNOSIS REQUIRED):   Anatomy    Preferred Imaging Location?:   Internal    Release to patient:   Immediate   AFP, Serum, Open Spina Bifida    Is patient insulin dependent?:   No    Patient weight (lb.):   195    Gestational Age (GA), weeks:   33    Date on which patient was at this GA:   01/30/2024    GA Calculation Method:   LMP    Number of fetuses:   1   Return in 4 weeks (on 02/29/2024) for ROB & Ultrasound.   Future Appointments  Date Time Provider Department Center  02/29/2024  8:15 AM [facility] US  1 [facility] None  02/29/2024  9:35 AM Dominic, Jinnie Jansky, CNM [facility] None    For next visit:  continue with routine prenatal care     Harlene LITTIE Cisco, CNM  10/27/251:17 PM

## 2024-02-01 ENCOUNTER — Ambulatory Visit (INDEPENDENT_AMBULATORY_CARE_PROVIDER_SITE_OTHER): Admitting: Certified Nurse Midwife

## 2024-02-01 VITALS — BP 106/63 | HR 99 | Wt 195.8 lb

## 2024-02-01 DIAGNOSIS — Z1379 Encounter for other screening for genetic and chromosomal anomalies: Secondary | ICD-10-CM

## 2024-02-01 DIAGNOSIS — Z3482 Encounter for supervision of other normal pregnancy, second trimester: Secondary | ICD-10-CM | POA: Diagnosis not present

## 2024-02-01 DIAGNOSIS — Z3689 Encounter for other specified antenatal screening: Secondary | ICD-10-CM

## 2024-02-01 DIAGNOSIS — Z3480 Encounter for supervision of other normal pregnancy, unspecified trimester: Secondary | ICD-10-CM

## 2024-02-01 DIAGNOSIS — Z3A16 16 weeks gestation of pregnancy: Secondary | ICD-10-CM | POA: Diagnosis not present

## 2024-02-01 NOTE — Patient Instructions (Signed)
 Signs and Symptoms of Labor Labor is the body's natural process of moving the baby and the placenta out of the uterus. The process of labor usually starts when the baby is full-term, between 74 and 41 weeks of pregnancy. Signs and symptoms that you are close to going into labor As your body prepares for labor and the birth of your baby, you may notice the following symptoms in the weeks and days before true labor starts: Passing a small amount of thick, bloody mucus from your vagina. This is called normal bloody show or losing your mucus plug. This may happen more than a week before labor begins, or right before labor begins, as the opening of the cervix starts to widen (dilate). For some women, the entire mucus plug passes at once. For others, pieces of the mucus plug may gradually pass over several days. Your baby moving (dropping) lower in your pelvis to get into position for birth (lightening). When this happens, you may feel more pressure on your bladder and pelvic bone and less pressure on your ribs. This may make it easier to breathe. It may also cause you to need to urinate more often and have problems with bowel movements. Having "practice contractions," also called Braxton Hicks contractions or false labor. These occur at irregular (unevenly spaced) intervals that are more than 10 minutes apart. False labor contractions are common after exercise or sexual activity. They will stop if you change position, rest, or drink fluids. These contractions are usually mild and do not get stronger over time. They may feel like: A backache or back pain. Mild cramps, similar to menstrual cramps. Tightening or pressure in your abdomen. Other early symptoms include: Nausea or loss of appetite. Diarrhea. Having a sudden burst of energy, or feeling very tired. Mood changes. Having trouble sleeping. Signs and symptoms that labor has begun Signs that you are in labor may include: Having contractions that come  at regular (evenly spaced) intervals and increase in intensity. This may feel like more intense tightening or pressure in your abdomen that moves to your back. Contractions may also feel like rhythmic pain in your upper thighs or back that comes and goes at regular intervals. If you are delivering for the first time, this change in intensity of contractions often occurs at a more gradual pace. If you have given birth before, you may notice a more rapid progression of contraction changes. Feeling pressure in the vaginal area. Your water breaking (rupture of membranes). This is when the sac of fluid that surrounds your baby breaks. Fluid leaking from your vagina may be clear or blood-tinged. Labor usually starts within 24 hours of your water breaking, but it may take longer to begin. Some people may feel a sudden gush of fluid; others may notice repeatedly damp underwear. Follow these instructions at home:  When labor starts, or if your water breaks, call your health care provider or nurse care line. Based on your situation, they will determine when you should go in for an exam. During early labor, you may be able to rest and manage symptoms at home. Some strategies to try at home include: Breathing and relaxation techniques. Taking a warm bath or shower. Listening to music. Using a heating pad on the lower back for pain. If directed, apply heat to the area as often as told by your health care provider. Use the heat source that your health care provider recommends, such as a moist heat pack or a heating pad. Place a  towel between your skin and the heat source. Leave the heat on for 20-30 minutes. Remove the heat if your skin turns bright red. This is especially important if you are unable to feel pain, heat, or cold. You have a greater risk of getting burned. Contact a health care provider if: Your labor has started. Your water breaks. You have nausea, vomiting, or diarrhea. Get help right away  if: You have painful, regular contractions that are 5 minutes apart or less. Labor starts before you are [redacted] weeks along in your pregnancy. You have a fever. You have bright red blood coming from your vagina. You do not feel your baby moving. You have a severe headache with or without vision problems. You have chest pain or shortness of breath. These symptoms may represent a serious problem that is an emergency. Do not wait to see if the symptoms will go away. Get medical help right away. Call your local emergency services (911 in the [place] Do not drive yourself to the hospital. Summary Labor is your body's natural process of moving your baby and the placenta out of your uterus. The process of labor usually starts when your baby is full-term, between 25 and 40 weeks of pregnancy. When labor starts, or if your water breaks, call your health care provider or nurse care line. Based on your situation, they will determine when you should go in for an exam. This information is not intended to replace advice given to you by your health care provider. Make sure you discuss any questions you have with your health care provider. Document Revised: 08/07/2020 Document Reviewed: 08/07/2020 Elsevier Patient Education  2024 [employer].

## 2024-02-01 NOTE — Assessment & Plan Note (Signed)
 Red flag warning signs reviewed. Encouraged increased water intake, consider one electrolyte beverage daily. Anatomy ultrasound ordered. AFP collected.

## 2024-02-03 ENCOUNTER — Ambulatory Visit: Payer: Self-pay | Admitting: Certified Nurse Midwife

## 2024-02-03 LAB — AFP, SERUM, OPEN SPINA BIFIDA
AFP MoM: 0.84
AFP Value: 24.4 ng/mL
Gest. Age on Collection Date: 16.3 wk
Maternal Age At EDD: 29.8 a
OSBR Risk 1 IN: 10000
Test Results:: NEGATIVE
Weight: 195 [lb_av]

## 2024-02-29 ENCOUNTER — Ambulatory Visit (INDEPENDENT_AMBULATORY_CARE_PROVIDER_SITE_OTHER)

## 2024-02-29 ENCOUNTER — Ambulatory Visit: Admitting: Licensed Practical Nurse

## 2024-02-29 ENCOUNTER — Encounter: Payer: Self-pay | Admitting: Licensed Practical Nurse

## 2024-02-29 VITALS — BP 103/69 | HR 83 | Wt 198.2 lb

## 2024-02-29 DIAGNOSIS — Z3480 Encounter for supervision of other normal pregnancy, unspecified trimester: Secondary | ICD-10-CM

## 2024-02-29 DIAGNOSIS — Z3482 Encounter for supervision of other normal pregnancy, second trimester: Secondary | ICD-10-CM

## 2024-02-29 DIAGNOSIS — Z3A19 19 weeks gestation of pregnancy: Secondary | ICD-10-CM | POA: Diagnosis not present

## 2024-02-29 DIAGNOSIS — Z3689 Encounter for other specified antenatal screening: Secondary | ICD-10-CM

## 2024-02-29 DIAGNOSIS — Z3A20 20 weeks gestation of pregnancy: Secondary | ICD-10-CM

## 2024-02-29 NOTE — Assessment & Plan Note (Addendum)
-  rec Magnesium, B2, and Co E Q 10 for HA, info placed in AVS -Rec seeing PCP for chest pain, could be related to anxiety but should be evaluated to rule out cardiac concerns -rec support belt for round ligament pain  -travel precautions given -US  today, normal anatomy, will return in 3 wks for insufficient  views  -warning signs reviewed

## 2024-02-29 NOTE — Patient Instructions (Signed)
 Magnesium 500 mg daily B2 400 mg daily Coenzyme q10 150 mg daily

## 2024-02-29 NOTE — Progress Notes (Signed)
    Return Prenatal Note   Subjective   28 y.o. H3E6976 at [redacted]w[redacted]d presents for this follow-up prenatal visit.  Patient Here with her MIL and daughter  Patient reports: Movement: Present Contractions: Irritability -Headache occur at random a couple times a week on left side  usually, just hurts, Tylenol  sometimes help usually after 2 tablets, Got them a lot in her pregnancy with her son, is prone HA when not pregnant -Chest pain; sharp pain starts in the center of chest, then shoots down her arm, this has occur 3 in the last couple of months,  I am always a worry person . Has happened outside of pregnancy. Feels it is related to anxiety as her sister gets the same sxs, she has not been evaluated for chest pain.  -Pain on left side, esp when laying a certain position  -Traveling to [place] this week   Objective   Flow sheet Vitals: Pulse Rate: 83 BP: 103/69 Fundal Height: 22 cm Fetal Heart Rate (bpm): 155 Total weight gain: 13 lb 3.2 oz (5.987 kg)  General Appearance  No acute distress, well appearing, and well nourished Pulmonary   Normal work of breathing Neurologic   Alert and oriented to person, place, and time Psychiatric   Mood and affect within normal limits   Assessment/Plan   Plan  28 y.o. H3E6976 at [redacted]w[redacted]d presents for follow-up OB visit. Reviewed prenatal record including previous visit note.  Supervision of other normal pregnancy, antepartum -rec Magnesium, B2, and Co E Q 10 for HA, info placed in AVS -Rec seeing PCP for chest pain, could be related to anxiety but should be evaluated to rule out cardiac concerns -rec support belt for round ligament pain  -travel precautions given -US  today, normal anatomy, will return in 3 wks for insufficient  views  -warning signs reviewed       No orders of the defined types were placed in this encounter.  Return in about 4 weeks (around 03/28/2024) for ROB, ok to do in 3 weeks to coinside wiht US  apt .   Future  Appointments  Date Time Provider Department Center  03/23/2024  9:15 AM [facility] US  1 [facility] None  03/23/2024 10:55 AM Slaughterbeck, Damien, CNM [facility] None    For next visit:  continue with routine prenatal care     JINNIE HERO [facility], CNM  02/28/2509:06 AM

## 2024-03-22 NOTE — Progress Notes (Unsigned)
° ° °  Return Prenatal Note   Subjective   28 y.o. H3E6976 at [redacted]w[redacted]d presents for this follow-up prenatal visit.  Patient is doing well. She has been having some abdominal tightness. She reports good fetal movement. She denies urinary symptoms. Patient reports: Movement: Present Contractions: Not present  Objective   Flow sheet Vitals: Pulse Rate: 79 BP: (!) 112/55 Fundal Height: 23 cm Fetal Heart Rate (bpm): 150 Total weight gain: 13 lb 3.2 oz (5.987 kg)  General Appearance  No acute distress, well appearing, and well nourished Pulmonary   Normal work of breathing Neurologic   Alert and oriented to person, place, and time Psychiatric   Mood and affect within normal limits   Assessment/Plan   Plan  28 y.o. H3E6976 at [redacted]w[redacted]d presents for follow-up OB visit. Reviewed prenatal record including previous visit note.  Supervision of other normal pregnancy, antepartum Feeling well overall. Feels this pregnancy is a bit easier than her last so far.  Anatomy US  completed today. Reviewed red flag warning signs anticipatory guidance for upcoming prenatal care.        Orders Placed This Encounter  Procedures   28 Week RH+Panel    Standing Status:   Future    Expected Date:   04/19/2024    Expiration Date:   07/18/2024   No follow-ups on file.   Future Appointments  Date Time Provider Department Center  04/20/2024 10:15 AM Jayne Harlene CROME, CNM [facility] None     For next visit:  ROB with 1 hour glucola, third trimester labs, and Tdap     Damien Parsley, CNM [facility] 12/18/20257:56 PM

## 2024-03-23 ENCOUNTER — Other Ambulatory Visit

## 2024-03-23 ENCOUNTER — Ambulatory Visit (INDEPENDENT_AMBULATORY_CARE_PROVIDER_SITE_OTHER): Admitting: Certified Nurse Midwife

## 2024-03-23 ENCOUNTER — Other Ambulatory Visit: Payer: Self-pay

## 2024-03-23 VITALS — BP 112/55 | HR 79

## 2024-03-23 DIAGNOSIS — Z114 Encounter for screening for human immunodeficiency virus [HIV]: Secondary | ICD-10-CM

## 2024-03-23 DIAGNOSIS — Z362 Encounter for other antenatal screening follow-up: Secondary | ICD-10-CM

## 2024-03-23 DIAGNOSIS — Z3482 Encounter for supervision of other normal pregnancy, second trimester: Secondary | ICD-10-CM | POA: Diagnosis not present

## 2024-03-23 DIAGNOSIS — Z3A24 24 weeks gestation of pregnancy: Secondary | ICD-10-CM

## 2024-03-23 DIAGNOSIS — Z3A23 23 weeks gestation of pregnancy: Secondary | ICD-10-CM | POA: Diagnosis not present

## 2024-03-23 DIAGNOSIS — Z131 Encounter for screening for diabetes mellitus: Secondary | ICD-10-CM

## 2024-03-23 DIAGNOSIS — Z130 Encounter for screening for diseases of the blood and blood-forming organs and certain disorders involving the immune mechanism: Secondary | ICD-10-CM

## 2024-03-23 DIAGNOSIS — Z3480 Encounter for supervision of other normal pregnancy, unspecified trimester: Secondary | ICD-10-CM

## 2024-03-24 NOTE — Assessment & Plan Note (Signed)
 Feeling well overall. Feels this pregnancy is a bit easier than her last so far.  Anatomy US  completed today. Reviewed red flag warning signs anticipatory guidance for upcoming prenatal care.

## 2024-03-25 ENCOUNTER — Encounter: Payer: Self-pay | Admitting: Emergency Medicine

## 2024-03-25 ENCOUNTER — Ambulatory Visit
Admission: EM | Admit: 2024-03-25 | Discharge: 2024-03-25 | Disposition: A | Discharge status: Home / Self Care | Attending: Physician Assistant | Admitting: Physician Assistant

## 2024-03-25 DIAGNOSIS — B349 Viral infection, unspecified: Secondary | ICD-10-CM | POA: Diagnosis not present

## 2024-03-25 DIAGNOSIS — Z3A23 23 weeks gestation of pregnancy: Secondary | ICD-10-CM

## 2024-03-25 DIAGNOSIS — R0789 Other chest pain: Secondary | ICD-10-CM | POA: Diagnosis not present

## 2024-03-25 DIAGNOSIS — R051 Acute cough: Secondary | ICD-10-CM | POA: Diagnosis not present

## 2024-03-25 DIAGNOSIS — J45901 Unspecified asthma with (acute) exacerbation: Secondary | ICD-10-CM | POA: Diagnosis not present

## 2024-03-25 LAB — POCT INFLUENZA A/B
Influenza A, POC: NEGATIVE
Influenza B, POC: NEGATIVE

## 2024-03-25 LAB — POC SOFIA SARS ANTIGEN FIA: SARS Coronavirus 2 Ag: NEGATIVE

## 2024-03-25 MED ORDER — PREDNISONE 20 MG PO TABS: 40.0000 mg | ORAL_TABLET | Freq: Every day | ORAL | 0 refills | Status: AC

## 2024-03-25 NOTE — ED Provider Notes (Signed)
 " [facility]    CSN: [id number] Arrival date & time: 03/25/24  0848      History   Chief Complaint Chief Complaint  Patient presents with   Cough    HPI Allison Black is a 28 y.o. female who is currently [redacted] weeks pregnant.  Medical history significant for asthma, bipolar disorder, and anxiety.   She presents today for 1.5-day history of cough, congestion, fatigue, chest tightness and shortness of breath.  Reports needing to use albuterol  multiple times a day to help with breathing.  Denies fever, sore throat, sinus pain, ear pain, vomiting or diarrhea.  Has not been taking any OTC meds because she is not sure what is safe to take in pregnancy.  She is concerned about having bronchitis.  No other complaints.  HPI  Past Medical History:  Diagnosis Date   Asthma    Bipolar disorder (HCC)    Family history of ovarian cancer    5/23 cancer genetic testing letter sent   Heart murmur    Long term current use of cannabis 04/23/2021   Panic attack     Patient Active Problem List   Diagnosis Date Noted   Short interval between pregnancies affecting pregnancy, antepartum 01/04/2024   History of shoulder dystocia in prior pregnancy, currently pregnant 01/04/2024   Supervision of other normal pregnancy, antepartum 12/11/2023   History of macrosomia in infant in prior pregnancy, currently pregnant 07/07/2023   History of high cholesterol 03/10/2023   Easy bruising 03/10/2023   Recurrent epistaxis 03/10/2023   Bipolar disease during pregnancy, antepartum (HCC) 08/15/2021   High risk medication use 06/13/2021   At risk for prolonged QT interval syndrome 04/23/2021   Bipolar and related disorder (HCC) 03/27/2021   GAD (generalized anxiety disorder) 03/27/2021   Cannabis use disorder, moderate, in early remission, in controlled environment, dependence (HCC) 03/27/2021   Asthma 02/10/2017   Episodic lightheadedness 12/19/2016   Depression with anxiety 10/09/2016     Past Surgical History:  Procedure Laterality Date   NO PAST SURGERIES      OB History     Gravida  6   Para  3   Term  3   Preterm      AB  2   Living  3      SAB  2   IAB      Ectopic      Multiple  0   Live Births  3            Home Medications    Prior to Admission medications  Medication Sig Start Date End Date Taking? Authorizing Provider  acetaminophen  (TYLENOL ) 325 MG tablet Take 2 tablets (650 mg total) by mouth every 4 (four) hours as needed (for pain scale < 4). 07/17/23  Yes Free, Lauraine PARAS, CNM  albuterol  (VENTOLIN  HFA) 108 (90 Base) MCG/ACT inhaler Inhale 1-2 puffs into the lungs every 6 (six) hours as needed for wheezing or shortness of breath. 12/30/22  Yes Arvis Jolan NOVAK, PA-C  predniSONE  (DELTASONE ) 20 MG tablet Take 2 tablets (40 mg total) by mouth daily for 5 days. 03/25/24 03/30/24 Yes Arvis Jolan NOVAK, PA-C  Prenatal Vit-Fe Fumarate-FA (PRENATAL PO) Take by mouth.   Yes [provider]    Family History Family History  Problem Relation Age of Onset   Bipolar disorder Mother    Lupus Mother    Healthy Father    Mental illness Sister    Asperger's syndrome Sister  Depression Sister    ADD / ADHD Sister    Healthy Brother    Autism Brother    Anxiety disorder Brother    Diabetes Maternal Grandmother    Healthy Maternal Grandfather    Parkinson's disease Paternal Grandmother    Heart failure Paternal Grandmother    Heart Problems Paternal Grandfather    Bipolar disorder Maternal Aunt    Ovarian cancer Maternal Aunt        early, maybe 30s   Bipolar disorder Maternal Aunt    Bipolar disorder Maternal Aunt    Schizophrenia Maternal Aunt    Suicidality Maternal Aunt    Diabetes Cousin     Social History Social History[1]   Allergies   Patient has no known allergies.   Review of Systems Review of Systems  Constitutional:  Positive for fatigue. Negative for chills, diaphoresis and fever.  HENT:  Positive  for congestion and rhinorrhea. Negative for ear pain, sinus pressure, sinus pain and sore throat.   Respiratory:  Positive for cough, chest tightness and shortness of breath.   Cardiovascular:  Negative for chest pain.  Gastrointestinal:  Negative for abdominal pain, nausea and vomiting.  Musculoskeletal:  Negative for arthralgias and myalgias.  Skin:  Negative for rash.  Neurological:  Negative for weakness and headaches.  Hematological:  Negative for adenopathy.     Physical Exam Triage Vital Signs ED Triage Vitals  Encounter Vitals Group     BP 03/25/24 0919 108/72     Girls Systolic BP Percentile --      Girls Diastolic BP Percentile --      Boys Systolic BP Percentile --      Boys Diastolic BP Percentile --      Pulse Rate 03/25/24 0919 (!) 109     Resp 03/25/24 0919 18     Temp 03/25/24 0919 98 F (36.7 C)     Temp Source 03/25/24 0919 Oral     SpO2 03/25/24 0919 95 %     Weight 03/25/24 0917 198 lb 3.1 oz (89.9 kg)     Height 03/25/24 0917 5' 2 (1.575 m)     Head Circumference --      Peak Flow --      Pain Score 03/25/24 0917 6     Pain Loc --      Pain Education --      Exclude from Growth Chart --    No data found.  Updated Vital Signs BP 108/72 (BP Location: Right Arm)   Pulse (!) 109   Temp 98 F (36.7 C) (Oral)   Resp 18   Ht 5' 2 (1.575 m)   Wt 198 lb 3.1 oz (89.9 kg)   LMP 10/10/2023 (Exact Date)   SpO2 95%   Breastfeeding No   BMI 36.25 kg/m     Physical Exam Vitals and nursing note reviewed.  Constitutional:      General: She is not in acute distress.    Appearance: Normal appearance. She is not ill-appearing or toxic-appearing.  HENT:     Head: Normocephalic and atraumatic.     Right Ear: Tympanic membrane, ear canal and external ear normal.     Left Ear: Tympanic membrane, ear canal and external ear normal.     Nose: Congestion present.     Mouth/Throat:     Mouth: Mucous membranes are moist.     Pharynx: Oropharynx is clear.   Eyes:     General: No scleral icterus.  Right eye: No discharge.        Left eye: No discharge.     Conjunctiva/sclera: Conjunctivae normal.  Cardiovascular:     Rate and Rhythm: Regular rhythm. Tachycardia present.     Heart sounds: Normal heart sounds.  Pulmonary:     Effort: Pulmonary effort is normal. No respiratory distress.     Breath sounds: Normal breath sounds.  Musculoskeletal:     Cervical back: Neck supple.  Skin:    General: Skin is dry.  Neurological:     General: No focal deficit present.     Mental Status: She is alert. Mental status is at baseline.     Motor: No weakness.     Gait: Gait normal.  Psychiatric:        Mood and Affect: Mood normal.        Behavior: Behavior normal.      UC Treatments / Results  Labs (all labs ordered are listed, but only abnormal results are displayed) Labs Reviewed  POC SOFIA SARS ANTIGEN FIA - Normal  POCT INFLUENZA A/B - Normal    EKG   Radiology No results found.  Procedures Procedures (including critical care time)  Medications Ordered in UC Medications - No data to display  Initial Impression / Assessment and Plan / UC Course  I have reviewed the triage vital signs and the nursing notes.  Pertinent labs & imaging results that were available during my care of the patient were reviewed by me and considered in my medical decision making (see chart for details).   28 year old pregnant asthmatic female presents for a little less than 2-day history of cough, congestion and fatigue.  Also reports chest tightness and shortness of breath.  Needs to use albuterol  more than normal.  No fever, sore throat, abdominal pain, vomiting or diarrhea.  Currently afebrile.  Overall well-appearing.  No acute distress.  On exam has nasal congestion.  No evidence of ear infection.  Throat clear.  Chest clear.  Heart regular rhythm but mild tachycardia at 109 bpm.  COVID and flu testing negative.  Viral illness.  With  complaint of chest tightness and shortness of breath, likely beginning to have asthma exacerbation.  Advised for her to continue using albuterol .  I sent prednisone  to pharmacy.  Encouraged her to try Robitussin, rest and fluids.  Reviewed returning if fever or acute worsening of symptoms.   Final Clinical Impressions(s) / UC Diagnoses   Final diagnoses:  Acute cough  Viral illness  Chest tightness  Asthma with acute exacerbation, unspecified asthma severity, unspecified whether persistent  [redacted] weeks gestation of pregnancy     Discharge Instructions      URI/COLD SYMPTOMS: Your exam today is consistent with a viral illness. Antibiotics are not indicated at this time. Use medications as directed, including cough syrup, nasal saline, and decongestants. Your symptoms should improve over the next few days and resolve within 7-10 days. Increase rest and fluids. F/u if symptoms worsen or predominate such as sore throat, ear pain, productive cough, shortness of breath, or if you develop high fevers or worsening fatigue over the next several days.      ED Prescriptions     Medication Sig Dispense Auth. Provider   predniSONE  (DELTASONE ) 20 MG tablet Take 2 tablets (40 mg total) by mouth daily for 5 days. 10 tablet Kayler Buckholtz B, PA-C      PDMP not reviewed this encounter.    [1]  Social History Tobacco Use   Smoking  status: Former    Types: Cigarettes   Smokeless tobacco: Never  Vaping Use   Vaping status: Former  Substance Use Topics   Alcohol use: Not Currently    Alcohol/week: 1.0 standard drink of alcohol    Types: 1 Shots of liquor per week    Comment: 1-2 shots a few times a month   Drug use: Not Currently    Frequency: 28.0 times per week    Types: Marijuana    Comment: stoped with 2024 lyndia Arvis Jolan KATHEE, PA-C 03/25/24 1013  "

## 2024-03-25 NOTE — Discharge Instructions (Addendum)

## 2024-03-25 NOTE — ED Triage Notes (Addendum)
 Pt states 2 days ago she had an asthma attack and feels like she has bronchitis from it. She has been taking albuterol  in haler. She had known asthma. She states she gets this about this time every year and goes straight to her chest. She is [redacted] weeks pregnant. Denies fever.

## 2024-04-19 NOTE — Progress Notes (Unsigned)
" ° ° °  Return Prenatal Note   Subjective   29 y.o. H3E6976 at [redacted]w[redacted]d presents for this follow-up prenatal visit.  Patient reports frequent headaches, but have been worse recently. Denies headache today, has tried tylenol  with minimal relief & prefers not to take medications unless she has to. Headaches are worse with weather changes/rain. Patient reports: Movement: Present Contractions: Not present  Objective   Flow sheet Vitals: Pulse Rate: 94 BP: 107/73 Fundal Height: 28 cm Fetal Heart Rate (bpm): 150 Total weight gain: 22 lb 4.8 oz (10.1 kg)  General Appearance  No acute distress, well appearing, and well nourished Pulmonary   Normal work of breathing Neurologic   Alert and oriented to person, place, and time Psychiatric   Mood and affect within normal limits   Assessment/Plan   Plan  29 y.o. H3E6976 at [redacted]w[redacted]d presents for follow-up OB visit. Reviewed prenatal record including previous visit note.  Supervision of other normal pregnancy, antepartum Reviewed kick counts and preterm labor warning signs. Instructed to call office or come to hospital with persistent headache, vision changes, regular contractions, leaking of fluid, decreased fetal movement or vaginal bleeding. 28w labs today. Headache treatment & prevention reviewed.      Orders Placed This Encounter  Procedures   Tdap vaccine greater than or equal to 7yo IM   No follow-ups on file.   Future Appointments  Date Time Provider Department Center  05/04/2024 10:15 AM Charma Domino, CNM [facility] None  05/18/2024 10:15 AM [facility] US  1 [facility] None  05/18/2024 11:15 AM Justino Eleanor HERO, CNM [facility] None    For next visit:  continue with routine prenatal care     Harlene LITTIE Cisco, CNM  1/14/202610:37 AM  "

## 2024-04-20 ENCOUNTER — Ambulatory Visit: Admitting: Certified Nurse Midwife

## 2024-04-20 ENCOUNTER — Other Ambulatory Visit

## 2024-04-20 VITALS — BP 107/73 | HR 94 | Wt 207.3 lb

## 2024-04-20 DIAGNOSIS — Z23 Encounter for immunization: Secondary | ICD-10-CM

## 2024-04-20 DIAGNOSIS — Z3482 Encounter for supervision of other normal pregnancy, second trimester: Secondary | ICD-10-CM

## 2024-04-20 DIAGNOSIS — Z3480 Encounter for supervision of other normal pregnancy, unspecified trimester: Secondary | ICD-10-CM

## 2024-04-20 DIAGNOSIS — Z114 Encounter for screening for human immunodeficiency virus [HIV]: Secondary | ICD-10-CM

## 2024-04-20 DIAGNOSIS — Z3A27 27 weeks gestation of pregnancy: Secondary | ICD-10-CM | POA: Diagnosis not present

## 2024-04-20 DIAGNOSIS — Z131 Encounter for screening for diabetes mellitus: Secondary | ICD-10-CM

## 2024-04-20 DIAGNOSIS — O09299 Supervision of pregnancy with other poor reproductive or obstetric history, unspecified trimester: Secondary | ICD-10-CM

## 2024-04-20 DIAGNOSIS — Z130 Encounter for screening for diseases of the blood and blood-forming organs and certain disorders involving the immune mechanism: Secondary | ICD-10-CM

## 2024-04-20 NOTE — Patient Instructions (Signed)
 Third Trimester of Pregnancy  The third trimester of pregnancy is from week 28 through week 40. This is months 7 through 9. The third trimester is a time when your baby is growing fast. Body changes during your third trimester Your body continues to change during this time. The changes usually go away after your baby is born. Physical changes You will continue to gain weight. You may get stretch marks on your hips, belly, and breasts. Your breasts will keep growing and may hurt. A yellow fluid (colostrum) may leak from your breasts. This is the first milk you're making for your baby. Your hair may grow faster and get thicker. In some cases, you may get hair loss. Your belly button may stick out. You may have more swelling in your hands, face, or ankles. Health changes You may have heartburn. You may feel short of breath. This is caused by the uterus that is now bigger. You may have more aches in the pelvis, back, or thighs. You may have more tingling or numbness in your hands, arms, and legs. You may pee more often. You may have trouble pooping (constipation) or swollen veins in the butt that can itch or get painful (hemorrhoids). Other changes You may have more problems sleeping. You may notice the baby moving lower in your belly (dropping). You may have more fluid coming from your vagina. Your joints may feel loose, and you may have pain around your pelvic bone. Follow these instructions at home: Medicines Take medicines only as told by your health care provider. Some medicines are not safe during pregnancy. Your provider may change the medicines that you take. Do not take any medicines unless told to by your provider. Take a prenatal vitamin that has at least 600 micrograms (mcg) of folic acid. Do not use herbal medicines, illegal drugs, or medicines that are not approved by your provider. Eating and drinking While you're pregnant your body needs additional nutrition to help  support your growing baby. Talk with your provider about your nutritional needs. Activity Most women are able to exercise regularly during pregnancy. Exercise routines may need to change at the end of your pregnancy. Talk to your provider about your activities and exercise routine. Relieving pain and discomfort Rest often with your legs raised if you have leg cramps or low back pain. Take warm sitz baths to soothe pain from hemorrhoids. Use hemorrhoid cream if your provider says it's okay. Wear a good, supportive bra if your breasts hurt. Do not use hot tubs, steam rooms, or saunas. Do not douche. Do not use tampons or scented pads. Safety Talk to your provider before traveling far distances. Wear your seatbelt at all times when you're in a car. Talk to your provider if someone hits you, hurts you, or yells at you. Preparing for birth To prepare for your baby: Take childbirth and breastfeeding classes. Visit the hospital and tour the maternity area. Buy a rear-facing car seat. Learn how to install it in your car. General instructions Avoid cat litter boxes and soil used by cats. These things carry germs that can cause harm to your pregnancy and your baby. Do not drink alcohol, smoke, vape, or use products with nicotine or tobacco in them. If you need help quitting, talk with your provider. Keep all follow-up visits for your third trimester. Your provider will do more exams and tests during this trimester. Write down your questions. Take them to your prenatal visits. Your provider also will: Talk with you about  your overall health. Give you advice or refer you to specialists who can help with different needs, including: Mental health and counseling. Foods and healthy eating. Ask for help if you need help with food. Where to find more information American Pregnancy Association: americanpregnancy.org [employer]ians and [employer]necologists: acog.org Office on [employer] Health:  TravelLesson.ca Contact a health care provider if: You have a headache that does not go away when you take medicine. You have any of these problems: You can't eat or drink. You have nausea and vomiting. You have watery poop (diarrhea) for 2 days or more. You have pain when you pee, or your pee smells bad. You have been sick for 2 days or more and aren't getting better. Contact your provider right away if: You have any of these coming from your vagina: Abnormal discharge. Bad-smelling fluid. Bleeding. Your baby is moving less than usual. You have signs of labor: You have any contractions, belly cramping, or have pain in your pelvis or lower back before 37 weeks of pregnancy (preterm labor). You have regular contractions that are less than 5 minutes apart. Your water breaks. You have symptoms of high blood pressure or preeclampsia. These include: A severe, throbbing headache that does not go away. Sudden or extreme swelling of your face, hands, legs, or feet. Vision problems: You see spots. You have blurry vision. Your eyes are sensitive to light. If you can't reach your provider, go to an urgent care or emergency room. Get help right away if: You faint, become confused, or can't think clearly. You have chest pain or trouble breathing. You have any kind of injury, such as from a fall or a car crash. These symptoms may be an emergency. Call 911 right away. Do not wait to see if the symptoms will go away. Do not drive yourself to the hospital. This information is not intended to replace advice given to you by your health care provider. Make sure you discuss any questions you have with your health care provider. Document Revised: 12/25/2022 Document Reviewed: 07/25/2022 Elsevier Patient Education  2024 [employer].

## 2024-04-21 ENCOUNTER — Ambulatory Visit: Payer: Self-pay | Admitting: Certified Nurse Midwife

## 2024-04-21 LAB — 28 WEEK RH+PANEL
Basophils Absolute: 0 x10E3/uL (ref 0.0–0.2)
Basos: 0 %
EOS (ABSOLUTE): 0.1 x10E3/uL (ref 0.0–0.4)
Eos: 1 %
Gestational Diabetes Screen: 136 mg/dL (ref 70–139)
HIV Screen 4th Generation wRfx: NONREACTIVE
Hematocrit: 34.7 % (ref 34.0–46.6)
Hemoglobin: 11.1 g/dL (ref 11.1–15.9)
Immature Grans (Abs): 0 x10E3/uL (ref 0.0–0.1)
Immature Granulocytes: 0 %
Lymphocytes Absolute: 1.5 x10E3/uL (ref 0.7–3.1)
Lymphs: 17 %
MCH: 28.2 pg (ref 26.6–33.0)
MCHC: 32 g/dL (ref 31.5–35.7)
MCV: 88 fL (ref 79–97)
Monocytes Absolute: 0.3 x10E3/uL (ref 0.1–0.9)
Monocytes: 4 %
Neutrophils Absolute: 7.1 x10E3/uL — ABNORMAL HIGH (ref 1.4–7.0)
Neutrophils: 78 %
Platelets: 240 x10E3/uL (ref 150–450)
RBC: 3.94 x10E6/uL (ref 3.77–5.28)
RDW: 13.2 % (ref 11.7–15.4)
RPR Ser Ql: NONREACTIVE
WBC: 9.1 x10E3/uL (ref 3.4–10.8)

## 2024-04-21 NOTE — Assessment & Plan Note (Signed)
 Reviewed kick counts and preterm labor warning signs. Instructed to call office or come to hospital with persistent headache, vision changes, regular contractions, leaking of fluid, decreased fetal movement or vaginal bleeding. 28w labs today. Headache treatment & prevention reviewed.

## 2024-04-23 LAB — HCV INTERPRETATION

## 2024-04-23 LAB — SPECIMEN STATUS REPORT

## 2024-04-23 LAB — HCV AB W REFLEX TO QUANT PCR: HCV Ab: NONREACTIVE

## 2024-04-23 LAB — HEPATITIS B SURFACE ANTIGEN: Hepatitis B Surface Ag: NEGATIVE

## 2024-05-04 ENCOUNTER — Ambulatory Visit: Admitting: Registered Nurse

## 2024-05-04 VITALS — BP 99/65 | HR 100 | Wt 210.8 lb

## 2024-05-04 DIAGNOSIS — Z3A29 29 weeks gestation of pregnancy: Secondary | ICD-10-CM

## 2024-05-04 DIAGNOSIS — Z3480 Encounter for supervision of other normal pregnancy, unspecified trimester: Secondary | ICD-10-CM

## 2024-05-04 DIAGNOSIS — O09299 Supervision of pregnancy with other poor reproductive or obstetric history, unspecified trimester: Secondary | ICD-10-CM

## 2024-05-04 DIAGNOSIS — O09293 Supervision of pregnancy with other poor reproductive or obstetric history, third trimester: Secondary | ICD-10-CM | POA: Diagnosis not present

## 2024-05-04 NOTE — Patient Instructions (Signed)
 Third Trimester of Pregnancy  The third trimester of pregnancy is from week 28 through week 40. This is months 7 through 9. The third trimester is a time when your baby is growing fast. Body changes during your third trimester Your body continues to change during this time. The changes usually go away after your baby is born. Physical changes You will continue to gain weight. You may get stretch marks on your hips, belly, and breasts. Your breasts will keep growing and may hurt. A yellow fluid (colostrum) may leak from your breasts. This is the first milk you're making for your baby. Your hair may grow faster and get thicker. In some cases, you may get hair loss. Your belly button may stick out. You may have more swelling in your hands, face, or ankles. Health changes You may have heartburn. You may feel short of breath. This is caused by the uterus that is now bigger. You may have more aches in the pelvis, back, or thighs. You may have more tingling or numbness in your hands, arms, and legs. You may pee more often. You may have trouble pooping (constipation) or swollen veins in the butt that can itch or get painful (hemorrhoids). Other changes You may have more problems sleeping. You may notice the baby moving lower in your belly (dropping). You may have more fluid coming from your vagina. Your joints may feel loose, and you may have pain around your pelvic bone. Follow these instructions at home: Medicines Take medicines only as told by your health care provider. Some medicines are not safe during pregnancy. Your provider may change the medicines that you take. Do not take any medicines unless told to by your provider. Take a prenatal vitamin that has at least 600 micrograms (mcg) of folic acid. Do not use herbal medicines, illegal drugs, or medicines that are not approved by your provider. Eating and drinking While you're pregnant your body needs additional nutrition to help  support your growing baby. Talk with your provider about your nutritional needs. Activity Most women are able to exercise regularly during pregnancy. Exercise routines may need to change at the end of your pregnancy. Talk to your provider about your activities and exercise routine. Relieving pain and discomfort Rest often with your legs raised if you have leg cramps or low back pain. Take warm sitz baths to soothe pain from hemorrhoids. Use hemorrhoid cream if your provider says it's okay. Wear a good, supportive bra if your breasts hurt. Do not use hot tubs, steam rooms, or saunas. Do not douche. Do not use tampons or scented pads. Safety Talk to your provider before traveling far distances. Wear your seatbelt at all times when you're in a car. Talk to your provider if someone hits you, hurts you, or yells at you. Preparing for birth To prepare for your baby: Take childbirth and breastfeeding classes. Visit the hospital and tour the maternity area. Buy a rear-facing car seat. Learn how to install it in your car. General instructions Avoid cat litter boxes and soil used by cats. These things carry germs that can cause harm to your pregnancy and your baby. Do not drink alcohol, smoke, vape, or use products with nicotine  or tobacco in them. If you need help quitting, talk with your provider. Keep all follow-up visits for your third trimester. Your provider will do more exams and tests during this trimester. Write down your questions. Take them to your prenatal visits. Your provider also will: Talk with you about  your overall health. Give you advice or refer you to specialists who can help with different needs, including: Mental health and counseling. Foods and healthy eating. Ask for help if you need help with food. Where to find more information American Pregnancy Association: americanpregnancy.org [employer]: acog.org Office on [employer] Health:  [employer] Contact a health care provider if: You have a headache that does not go away when you take medicine. You have any of these problems: You can't eat or drink. You have nausea and vomiting. You have watery poop (diarrhea) for 2 days or more. You have pain when you pee, or your pee smells bad. You have been sick for 2 days or more and aren't getting better. Contact your provider right away if: You have any of these coming from your vagina: Abnormal discharge. Bad-smelling fluid. Bleeding. Your baby is moving less than usual. You have signs of labor: You have any contractions, belly cramping, or have pain in your pelvis or lower back before 37 weeks of pregnancy (preterm labor). You have regular contractions that are less than 5 minutes apart. Your water breaks. You have symptoms of high blood pressure or preeclampsia. These include: A severe, throbbing headache that does not go away. Sudden or extreme swelling of your face, hands, legs, or feet. Vision problems: You see spots. You have blurry vision. Your eyes are sensitive to light. If you can't reach your provider, go to an urgent care or emergency room. Get help right away if: You faint, become confused, or can't think clearly. You have chest pain or trouble breathing. You have any kind of injury, such as from a fall or a car crash. These symptoms may be an emergency. Call 911 right away. Do not wait to see if the symptoms will go away. Do not drive yourself to the hospital. This information is not intended to replace advice given to you by your health care provider. Make sure you discuss any questions you have with your health care provider. Document Revised: 12/25/2022 Document Reviewed: 07/25/2022 Elsevier Patient Education  2024 [employer] Darol Irving Contractions: Self-Care Braxton Hicks contractions may feel like labor contractions, but they're like practice for real labor. They can start when you're  halfway through your pregnancy. During the last 2 months of your pregnancy, these contractions may happen more and hurt more. However, they aren't a sign that you're in real labor. What are Darol Irving contractions? Braxton Hicks contractions make your belly muscles tighten. They aren't real labor because they don't make your cervix, which is the lowest part of your uterus, open and thin. This tightening of your belly before labor starts can also be called false labor. How to tell the difference between true labor and false labor True labor contractions Last 60-90 seconds. Happen on a pattern. Get stronger and last longer over time. Don't go away when you: Walk. Change positions. Rest. Discomfort often begins in the back and then moves to the front. The cervix opens and thins. False labor contractions Are weak and last a short time. Don't happen on a pattern. Happen farther apart than true labor. May go away when you: Walk. Change positions. Rest. May be noticed more at the end of the day. Discomfort is often only felt in the front of the belly. The cervix usually does not open or thin. Sometimes, the only way to tell if you're in true labor is for your health care provider to check your cervix. Your provider will do  a physical exam and may monitor your contractions. If you're in true labor, your provider may send you home with instructions about when to return to the hospital or may send you to the hospital right away. Follow these instructions at home:  Take your medicines only as told. Drink more fluids as told. Dehydration may cause these contractions. Dehydration is when there's not enough water in your body. If Braxton Hicks contractions are making you uncomfortable: Change your position or activity. Sit and rest in a tub of warm water. Do slow and deep breathing several times an hour. Keep all follow-up visits. Your provider will need to check your health and your baby's  health. Contact a health care provider if: Your contractions become: Stronger. More regular. Closer together. You have mucus from your vagina that has blood in it. Get help right away if: You feel your baby moving less than usual. You have any amount of fluid that flows from your vagina without stopping. You have signs or symptoms of labor before 37 weeks of pregnancy, such as: Contractions that are 5 minutes or less apart, or that get stronger and last longer. Sudden, sharp pain in your belly or lower back. These symptoms may be an emergency. Call 911 right away. Do not wait to see if the symptoms will go away. Do not drive yourself to the hospital. This information is not intended to replace advice given to you by your health care provider. Make sure you discuss any questions you have with your health care provider. Document Revised: 11/04/2022 Document Reviewed: 11/04/2022 Elsevier Patient Education  2025 [employer].

## 2024-05-04 NOTE — Progress Notes (Signed)
" ° ° °  Return Prenatal Note   Subjective   29 y.o. H3E6976 at [redacted]w[redacted]d presents for this follow-up prenatal visit. She is scheduled for a growth scan on 2/11 due to hx of macrosomia with shoulder dystocia. Patient has a lot of aches and pains, but is generally well.  Patient reports: Movement: Present Contractions: Irritability  Objective   Flow sheet Vitals: Pulse Rate: 100 BP: 99/65 Fundal Height: 32 cm Fetal Heart Rate (bpm): 150 Total weight gain: 25 lb 12.8 oz (11.7 kg)  General Appearance  No acute distress, well appearing, and well nourished Pulmonary   Normal work of breathing Neurologic   Alert and oriented to person, place, and time Psychiatric   Mood and affect within normal limits   Assessment/Plan   Plan  29 y.o. H3E6976 at [redacted]w[redacted]d presents for follow-up OB visit. Reviewed prenatal record including previous visit note.  History of macrosomia in infant in prior pregnancy, currently pregnant Normal GDM screen Growth scan on 2/11.  History of shoulder dystocia in prior pregnancy, currently pregnant 3rd baby-posterior arm delivered, no injury, declines pC/S. Shoulder dystocia was with her SMALLEST baby (8lbs 12oz). Pelvis proven to 9.5lbs. Normal GDM screen Growth scan on 2/11.      No orders of the defined types were placed in this encounter.  Return in about 2 weeks (around 05/18/2024).   Future Appointments  Date Time Provider Department Center  05/18/2024 10:15 AM [facility] US  1 [facility] None  05/18/2024 11:15 AM Justino Eleanor HERO, CNM [facility] None    For next visit:  continue with routine prenatal care     Lauraine Lakes, CNM  05/04/24 10:56 AM  "

## 2024-05-04 NOTE — Assessment & Plan Note (Signed)
 Normal GDM screen Growth scan on 2/11.

## 2024-05-04 NOTE — Assessment & Plan Note (Addendum)
 3rd baby-posterior arm delivered, no injury, declines pC/S. Shoulder dystocia was with her SMALLEST baby (8lbs 12oz). Pelvis proven to 9.5lbs. Normal GDM screen Growth scan on 2/11.

## 2024-05-18 ENCOUNTER — Encounter: Admitting: Obstetrics

## 2024-05-18 ENCOUNTER — Other Ambulatory Visit
